# Patient Record
Sex: Male | Born: 1970 | Race: White | Hispanic: No | Smoking: Current every day smoker
Health system: Southern US, Community
[De-identification: ages and names within clinical notes are randomized; demographics above are authoritative.]

## PROBLEM LIST (undated history)

## (undated) DIAGNOSIS — G1111 Friedreich ataxia: Secondary | ICD-10-CM

## (undated) DIAGNOSIS — I219 Acute myocardial infarction, unspecified: Secondary | ICD-10-CM

## (undated) DIAGNOSIS — I10 Essential (primary) hypertension: Secondary | ICD-10-CM

## (undated) DIAGNOSIS — C801 Malignant (primary) neoplasm, unspecified: Secondary | ICD-10-CM

## (undated) DIAGNOSIS — G8929 Other chronic pain: Secondary | ICD-10-CM

## (undated) DIAGNOSIS — M549 Dorsalgia, unspecified: Secondary | ICD-10-CM

## (undated) HISTORY — PX: NECK SURGERY: SHX720

## (undated) HISTORY — PX: TRACHEOSTOMY: SUR1362

---

## 2001-03-09 ENCOUNTER — Emergency Department (HOSPITAL_COMMUNITY): Admission: EM | Admit: 2001-03-09 | Discharge: 2001-03-09 | Payer: Self-pay | Admitting: Emergency Medicine

## 2002-04-13 ENCOUNTER — Emergency Department (HOSPITAL_COMMUNITY): Admission: EM | Admit: 2002-04-13 | Discharge: 2002-04-14 | Payer: Self-pay | Admitting: Emergency Medicine

## 2004-09-04 ENCOUNTER — Emergency Department (HOSPITAL_COMMUNITY): Admission: EM | Admit: 2004-09-04 | Discharge: 2004-09-04 | Payer: Self-pay | Admitting: *Deleted

## 2007-10-01 ENCOUNTER — Emergency Department (HOSPITAL_COMMUNITY): Admission: EM | Admit: 2007-10-01 | Discharge: 2007-10-02 | Payer: Self-pay | Admitting: Emergency Medicine

## 2009-02-15 ENCOUNTER — Emergency Department (HOSPITAL_COMMUNITY): Admission: EM | Admit: 2009-02-15 | Discharge: 2009-02-15 | Payer: Self-pay | Admitting: Internal Medicine

## 2009-06-18 ENCOUNTER — Emergency Department (HOSPITAL_COMMUNITY): Admission: EM | Admit: 2009-06-18 | Discharge: 2009-06-18 | Payer: Self-pay | Admitting: Emergency Medicine

## 2010-04-07 LAB — CBC
Hemoglobin: 16.5 g/dL (ref 13.0–17.0)
MCHC: 34.6 g/dL (ref 30.0–36.0)
RDW: 11.9 % (ref 11.5–15.5)

## 2010-04-07 LAB — BASIC METABOLIC PANEL
CO2: 30 mEq/L (ref 19–32)
Calcium: 9.2 mg/dL (ref 8.4–10.5)
Glucose, Bld: 103 mg/dL — ABNORMAL HIGH (ref 70–99)
Sodium: 141 mEq/L (ref 135–145)

## 2010-04-07 LAB — DIFFERENTIAL
Basophils Absolute: 0 10*3/uL (ref 0.0–0.1)
Basophils Relative: 0 % (ref 0–1)
Eosinophils Absolute: 0 10*3/uL (ref 0.0–0.7)
Eosinophils Relative: 0 % (ref 0–5)
Monocytes Absolute: 0.5 10*3/uL (ref 0.1–1.0)
Neutro Abs: 2.3 10*3/uL (ref 1.7–7.7)

## 2010-04-07 LAB — POCT CARDIAC MARKERS
CKMB, poc: <1 ng/mL — ABNORMAL LOW (ref 1.0–8.0)
Myoglobin, poc: 31.1 ng/mL (ref 12–200)
Troponin i, poc: <0.05 ng/mL (ref 0.00–0.09)

## 2010-04-08 LAB — COMPREHENSIVE METABOLIC PANEL
ALT: 13 U/L (ref 0–53)
Albumin: 3.4 g/dL — ABNORMAL LOW (ref 3.5–5.2)
Alkaline Phosphatase: 81 U/L (ref 39–117)
Calcium: 9 mg/dL (ref 8.4–10.5)
Potassium: 4.4 mEq/L (ref 3.5–5.1)
Sodium: 135 mEq/L (ref 135–145)
Total Protein: 6.7 g/dL (ref 6.0–8.3)

## 2010-04-08 LAB — URINALYSIS, ROUTINE W REFLEX MICROSCOPIC
Nitrite: NEGATIVE
Protein, ur: NEGATIVE mg/dL
Urobilinogen, UA: 0.2 mg/dL (ref 0.0–1.0)

## 2010-04-08 LAB — DIFFERENTIAL
Basophils Relative: 0 % (ref 0–1)
Eosinophils Absolute: 0 10*3/uL (ref 0.0–0.7)
Lymphs Abs: 0.8 10*3/uL (ref 0.7–4.0)
Monocytes Absolute: 0.9 10*3/uL (ref 0.1–1.0)
Monocytes Relative: 10 % (ref 3–12)
Neutro Abs: 8 10*3/uL — ABNORMAL HIGH (ref 1.7–7.7)

## 2010-04-08 LAB — CBC
MCHC: 34.4 g/dL (ref 30.0–36.0)
Platelets: 227 10*3/uL (ref 150–400)
RDW: 11.7 % (ref 11.5–15.5)

## 2010-10-23 LAB — POCT CARDIAC MARKERS
CKMB, poc: <1 — ABNORMAL LOW
Myoglobin, poc: 25.5
Myoglobin, poc: 26.3

## 2010-10-23 LAB — DIFFERENTIAL
Basophils Relative: 1
Eosinophils Absolute: 0
Eosinophils Relative: 1
Lymphs Abs: 1.5
Monocytes Absolute: 0.5
Monocytes Relative: 12
Neutrophils Relative %: 54

## 2010-10-23 LAB — CBC
HCT: 45.1
MCHC: 34.3
MCV: 102.9 — ABNORMAL HIGH
RBC: 4.39

## 2010-10-23 LAB — POCT I-STAT, CHEM 8
Calcium, Ion: 1.08 — ABNORMAL LOW
Glucose, Bld: 89
HCT: 49
Hemoglobin: 16.7
TCO2: 27

## 2010-10-23 LAB — RAPID URINE DRUG SCREEN, HOSP PERFORMED
Amphetamines: NOT DETECTED
Benzodiazepines: NOT DETECTED
Cocaine: NOT DETECTED

## 2010-10-23 LAB — URINALYSIS, ROUTINE W REFLEX MICROSCOPIC
Glucose, UA: NEGATIVE
Hgb urine dipstick: NEGATIVE
Ketones, ur: NEGATIVE
Protein, ur: NEGATIVE
pH: 7

## 2010-10-23 LAB — HEPATIC FUNCTION PANEL
ALT: 39
Alkaline Phosphatase: 80
Bilirubin, Direct: 0.4 — ABNORMAL HIGH
Indirect Bilirubin: 1.4 — ABNORMAL HIGH
Total Bilirubin: 1.8 — ABNORMAL HIGH
Total Protein: 6.5

## 2010-10-23 LAB — LIPASE, BLOOD: Lipase: 63 — ABNORMAL HIGH

## 2010-11-25 ENCOUNTER — Emergency Department (HOSPITAL_COMMUNITY)
Admission: EM | Admit: 2010-11-25 | Discharge: 2010-11-25 | Disposition: A | Discharge status: Home / Self Care | Payer: Self-pay

## 2010-11-25 NOTE — ED Notes (Signed)
Pt called for triage x4.  No response. 

## 2010-11-25 NOTE — ED Notes (Signed)
Pt called once. No answer 

## 2010-11-25 NOTE — ED Notes (Signed)
Pt called twice. No answer 

## 2011-05-30 ENCOUNTER — Emergency Department (HOSPITAL_COMMUNITY)
Admission: EM | Admit: 2011-05-30 | Discharge: 2011-05-30 | Disposition: A | Discharge status: Home / Self Care | Payer: Self-pay | Attending: Emergency Medicine | Admitting: Emergency Medicine

## 2011-05-30 ENCOUNTER — Encounter (HOSPITAL_COMMUNITY): Payer: Self-pay | Admitting: *Deleted

## 2011-05-30 ENCOUNTER — Emergency Department (HOSPITAL_COMMUNITY): Payer: Self-pay

## 2011-05-30 DIAGNOSIS — I252 Old myocardial infarction: Secondary | ICD-10-CM | POA: Insufficient documentation

## 2011-05-30 DIAGNOSIS — I10 Essential (primary) hypertension: Secondary | ICD-10-CM | POA: Insufficient documentation

## 2011-05-30 DIAGNOSIS — R197 Diarrhea, unspecified: Secondary | ICD-10-CM | POA: Insufficient documentation

## 2011-05-30 DIAGNOSIS — F17200 Nicotine dependence, unspecified, uncomplicated: Secondary | ICD-10-CM | POA: Insufficient documentation

## 2011-05-30 DIAGNOSIS — R079 Chest pain, unspecified: Secondary | ICD-10-CM | POA: Insufficient documentation

## 2011-05-30 DIAGNOSIS — R112 Nausea with vomiting, unspecified: Secondary | ICD-10-CM | POA: Insufficient documentation

## 2011-05-30 DIAGNOSIS — J9383 Other pneumothorax: Secondary | ICD-10-CM | POA: Insufficient documentation

## 2011-05-30 HISTORY — DX: Acute myocardial infarction, unspecified: I21.9

## 2011-05-30 HISTORY — DX: Essential (primary) hypertension: I10

## 2011-05-30 LAB — DIFFERENTIAL
Basophils Absolute: 0 10*3/uL (ref 0.0–0.1)
Lymphocytes Relative: 33 % (ref 12–46)
Lymphs Abs: 1.7 10*3/uL (ref 0.7–4.0)
Neutro Abs: 2.8 10*3/uL (ref 1.7–7.7)
Neutrophils Relative %: 54 % (ref 43–77)

## 2011-05-30 LAB — RAPID URINE DRUG SCREEN, HOSP PERFORMED
Amphetamines: NOT DETECTED
Barbiturates: NOT DETECTED
Cocaine: NOT DETECTED
Opiates: NOT DETECTED
Tetrahydrocannabinol: POSITIVE — AB

## 2011-05-30 LAB — CBC
Platelets: 159 10*3/uL (ref 150–400)
RBC: 3.78 MIL/uL — ABNORMAL LOW (ref 4.22–5.81)
RDW: 12.4 % (ref 11.5–15.5)
WBC: 5.2 10*3/uL (ref 4.0–10.5)

## 2011-05-30 LAB — URINALYSIS, ROUTINE W REFLEX MICROSCOPIC
Glucose, UA: NEGATIVE mg/dL
Leukocytes, UA: NEGATIVE
Nitrite: NEGATIVE
pH: 6 (ref 5.0–8.0)

## 2011-05-30 LAB — TROPONIN I: Troponin I: <0.3 ng/mL (ref <0.30)

## 2011-05-30 LAB — COMPREHENSIVE METABOLIC PANEL
AST: 318 U/L — ABNORMAL HIGH (ref 0–37)
Albumin: 3.3 g/dL — ABNORMAL LOW (ref 3.5–5.2)
BUN: 9 mg/dL (ref 6–23)
Chloride: 99 mEq/L (ref 96–112)
Creatinine, Ser: 0.65 mg/dL (ref 0.50–1.35)
Potassium: 3.8 mEq/L (ref 3.5–5.1)
Total Bilirubin: 0.9 mg/dL (ref 0.3–1.2)
Total Protein: 6.3 g/dL (ref 6.0–8.3)

## 2011-05-30 MED ORDER — SODIUM CHLORIDE 0.9 % IV SOLN: 1000.0000 mL | INTRAVENOUS | Status: DC

## 2011-05-30 MED ORDER — SODIUM CHLORIDE 0.9 % IV SOLN
1000.0000 mL | Freq: Once | INTRAVENOUS | Status: AC
  Administered 2011-05-30: 1000 mL via INTRAVENOUS

## 2011-05-30 MED ORDER — SODIUM CHLORIDE 0.9 % IV SOLN: 1000.0000 mL | Freq: Once | INTRAVENOUS | Status: DC

## 2011-05-30 NOTE — ED Notes (Signed)
Chest pain since yesterday 1030 am.  Nausea, diarrhea.,

## 2011-05-30 NOTE — ED Notes (Signed)
Pt left prior to d/c papers given. Pt room found with no IV in trash. Sheriffs office contacted for a well check on pt to confirm IV removal. Per dispatch officer will call back once contact is made.

## 2011-05-30 NOTE — Discharge Instructions (Signed)
Diarrhea Diarrhea is watery poop (stool). The most common cause of diarrhea is a germ. Other causes include:  Food poisoning.   A reaction to medicine.  HOME CARE   Drink clear fluids. This can stop you from losing too much body fluid (dehydration).   Drink enough fluids to keep your pee (urine) clear or pale yellow.   Avoid solid foods and dairy products until you start to feel better. Then start eating bland foods, such as:   Bananas.   Rice.   Crackers.   Applesauce.   Dry toast.   Avoid spicy foods, caffeine, and alcohol.   Your doctor may give medicine to help with cramps and watery poop. Take this as told. Avoid these medicines if you have a fever or blood in your poop.   Take your medicine as told. Finish them even if you start to feel better.  GET HELP RIGHT AWAY IF:   The watery poop lasts longer than 3 days.   You have a fever.   Your baby is older than 3 months with a rectal temperature of 100.5 F (38.1 C) or higher for more than 1 day.   There is blood in your poop.   You start to throw up (vomit).   You lose too much fluid.  MAKE SURE YOU:   Understand these instructions.   Will watch your condition.   Will get help right away if you are not doing well or get worse.  Document Released: 06/25/2007 Document Revised: 12/26/2010 Document Reviewed: 06/25/2007 [facility] Patient Information 2012 [place], [place].Pneumothorax A pneumothorax is a collapsed lung. This is a condition that usually occurs suddenly. It happens when air begins leaking from a lung and begins to build up between the lung and the pleura (the thin lining around the lung on the inside of the rib cage). When this is small, there may be minimal pain, difficulty breathing, or other problems. Sometimes it can be followed without being admitted to the hospital. When it is larger, it may require hospitalization. A tube (called a chest tube) may need to be inserted into the air space. It will  remove the air between the lung and the rib cage. This immediately expands the lung back to normal size and makes breathing easier. A chest tube is usually in for 1 to 7 days, although it may be longer. CAUSES   It may happen spontaneously with no apparent reason.   It may happen because of previous lung disease or problems. Examples of this include:   Asthma.   Emphysema.   Injury   Rib fractures.   Blunt (from a car accident or a fall).   Sharp (from a knife or gunshot wound).  SYMPTOMS  This condition may be associated with:  Cough.   Chest pain.   Shortness of breath.   Increased rate of breathing.  HOME CARE INSTRUCTIONS   Only take over-the-counter or prescription medicines for pain, discomfort, or fever as directed by your caregiver.   Smoking is a common aggravating factor. Stop smoking.   If a cough or pain makes it difficult to sleep at night, try sleeping in a semi-upright position in a recliner or using 2 or 3 pillows may help.   Rest as you feel it is needed. Your body will help guide you in this.   If you had a chest tube and it was removed, ask your provider when it is okay to remove the dressing.   Do not fly in  an airplane or scuba dive after being treated for a pneumothorax until your provider says it is okay.  If you have been sent home with a diagnosis of pneumothorax, it is small and your caregiver believes it will get better without further treatment. However, your condition can change over time. You must monitor your condition carefully. If you have a significant change in how you feel, carefully follow the instructions below. SEEK IMMEDIATE MEDICAL CARE IF:   You have increasing shortness of breath.   You cannot control your cough with suppressants and are losing sleep.   You begin coughing up blood.   You develop pain which is getting worse or is uncontrolled with medicines.   You have a fever.   You develop pus-like sputum.   Your  symptoms which brought you initially in for care are getting worse rather than better or are not controlled with medicines.  MAKE SURE YOU:   Understand these instructions.   Will watch your condition.   Will get help right away if you are not doing well or get worse.  Document Released: 01/06/2005 Document Revised: 12/26/2010 Document Reviewed: 03/02/2008  [facility] Patient Information 2012 [place], [place].

## 2011-05-30 NOTE — ED Notes (Signed)
Patient has clothing on and refused vital signs.  Requested that saline lock be removed and wants to be discharged.  Advised patient I could not take saline lock out unless nurse instructed me to.  Advised nurse of patient's request.

## 2011-05-30 NOTE — ED Provider Notes (Signed)
History     CSN: [id number]  Arrival date & time 05/30/11  1251   First MD Initiated Contact with Patient 05/30/11 1319      Chief Complaint  Patient presents with  . Chest Pain    (Consider location/radiation/quality/duration/timing/severity/associated sxs/prior treatment) HPI Comments: Jerome Washington is a 41 y.o. Male who is here to 2 diarrhea for 2 days. Complains of loose, brown diarrhea 5-6 times per day. He has had nausea with occasional vomiting. No blood in stool or emesis. He denies fever, cough, shortness of breath. He has intermittent left upper chest pain on and off for 2 days; which started after he moved several heavy trays of beans. Each episode lasts about 10 minutes and is worse with dep breathing. He denies taking medicine for the problem. He has not had this  problem previously. His mother states that he is chronically underweight. Patient states he has immediate urge to defecate after eating many years.   Patient is a 41 y.o. male presenting with chest pain. The history is provided by a parent and the patient.  Chest Pain     Past Medical History  Diagnosis Date  . Myocardial infarction   . Hypertension     History reviewed. No pertinent past surgical history.  History reviewed. No pertinent family history.  History  Substance Use Topics  . Smoking status: Current Everyday Smoker  . Smokeless tobacco: Not on file  . Alcohol Use: Yes      Review of Systems  Cardiovascular: Positive for chest pain.  All other systems reviewed and are negative.    Allergies  Review of patient's allergies indicates no known allergies.  Home Medications   Current Outpatient Rx  Name Route Sig Dispense Refill  . VITAMIN C PO Oral Take 1 tablet by mouth daily.    . ASPIRIN EC 81 MG PO TBEC Oral Take 162 mg by mouth daily as needed. For pain      BP 136/89  Pulse 99  Temp(Src) 98.4 F (36.9 C) (Oral)  Resp 20  Ht 6' (1.829 m)  Wt 140 lb (63.504 kg)  BMI  18.99 kg/m2  SpO2 99%  Physical Exam  Nursing note and vitals reviewed. Constitutional: He is oriented to person, place, and time. He appears well-developed.       He is underweight.  HENT:  Head: Normocephalic and atraumatic.  Right Ear: External ear normal.  Left Ear: External ear normal.  Eyes: Conjunctivae and EOM are normal. Pupils are equal, round, and reactive to light.  Neck: Normal range of motion and phonation normal. Neck supple.  Cardiovascular: Normal rate, regular rhythm, normal heart sounds and intact distal pulses.   Pulmonary/Chest: Effort normal and breath sounds normal. He exhibits no bony tenderness.  Abdominal: Soft. Normal appearance. He exhibits no mass. There is no tenderness. There is no guarding.  Musculoskeletal: Normal range of motion.  Neurological: He is alert and oriented to person, place, and time. He has normal strength. No cranial nerve deficit or sensory deficit. He exhibits normal muscle tone. Coordination normal.  Skin: Skin is warm, dry and intact.  Psychiatric: He has a normal mood and affect. His behavior is normal. Judgment and thought content normal.    ED Course  Procedures (including critical care time) Repeat vitals at 1315 normal. Pulse oxygenation 99% on room air is normal   Date: 05/30/2011  Rate: 109  Rhythm: sinus tachycardia  QRS Axis: normal  Intervals: normal  ST/T Wave abnormalities: normal  Conduction Disutrbances:none  Narrative Interpretation: poor R wave progression  Old EKG Reviewed: unchanged    Labs Reviewed  CBC - Abnormal; Notable for the following:    RBC 3.78 (*)    HCT 38.8 (*)    MCV 102.6 (*)    MCH 35.7 (*)    All other components within normal limits  URINALYSIS, ROUTINE W REFLEX MICROSCOPIC - Abnormal; Notable for the following:    Specific Gravity, Urine >1.030 (*)    All other components within normal limits  URINE RAPID DRUG SCREEN (HOSP PERFORMED) - Abnormal; Notable for the following:     Tetrahydrocannabinol POSITIVE (*)    All other components within normal limits  COMPREHENSIVE METABOLIC PANEL - Abnormal; Notable for the following:    Albumin 3.3 (*)    AST 318 (*)    ALT 89 (*)    Alkaline Phosphatase 205 (*)    All other components within normal limits  DIFFERENTIAL  TROPONIN I  URINE CULTURE   Dg Chest 2 View  05/30/2011  *RADIOLOGY REPORT*  Clinical Data: Chest pain  CHEST - 2 VIEW  Comparison: 02/15/2009  Findings: 10% left apical pneumothorax.  Hyperaeration.  Normal heart size.  No mass or consolidation.  IMPRESSION: 10% left apical pneumothorax.  Original Report Authenticated By: Donavan Burnet, M.D.     1. Pneumothorax   2. Diarrhea       MDM   Chest discomfort, likely due to pneumothorax. He is now about  >24 hours out from the probable onset of the pneumothorax. He has no respiratory distress. It is unlikely that he will progress. No apparent cause for pneumothorax. His diarrhea is ongoing and chronic. He is moderately malnourished. Doubt significant metabolic disorder, occult infection or impending vascular collapse.   Plan: Home Medications- Tylenol for pain; Home Treatments- rest; Recommended follow up- PCP in 3 days for repeat CXR, return here for worsening sx      Flint Melter, MD 05/30/11 [phone]

## 2011-06-01 LAB — URINE CULTURE
Colony Count: NO GROWTH
Culture  Setup Time: 201305110348

## 2011-06-10 ENCOUNTER — Emergency Department (HOSPITAL_COMMUNITY): Payer: Self-pay

## 2011-06-10 ENCOUNTER — Encounter (HOSPITAL_COMMUNITY): Payer: Self-pay

## 2011-06-10 ENCOUNTER — Emergency Department (HOSPITAL_COMMUNITY)
Admission: EM | Admit: 2011-06-10 | Discharge: 2011-06-10 | Disposition: A | Discharge status: Home / Self Care | Payer: Self-pay | Attending: Emergency Medicine | Admitting: Emergency Medicine

## 2011-06-10 DIAGNOSIS — I252 Old myocardial infarction: Secondary | ICD-10-CM | POA: Insufficient documentation

## 2011-06-10 DIAGNOSIS — I10 Essential (primary) hypertension: Secondary | ICD-10-CM | POA: Insufficient documentation

## 2011-06-10 DIAGNOSIS — F1010 Alcohol abuse, uncomplicated: Secondary | ICD-10-CM | POA: Insufficient documentation

## 2011-06-10 DIAGNOSIS — R079 Chest pain, unspecified: Secondary | ICD-10-CM | POA: Insufficient documentation

## 2011-06-10 MED ORDER — OXYCODONE-ACETAMINOPHEN 5-325 MG PO TABS
1.0000 | ORAL_TABLET | Freq: Once | ORAL | Status: AC
  Administered 2011-06-10: 1 via ORAL
  Filled 2011-06-10: qty 1

## 2011-06-10 MED ORDER — OXYCODONE-ACETAMINOPHEN 5-325 MG PO TABS: 1.0000 | ORAL_TABLET | ORAL | Status: AC | PRN

## 2011-06-10 NOTE — ED Notes (Signed)
Pt reports was here a few days ago and was told small part of left lung was collapsed.  PT says went home and has been taking ibuprofen.  Reports has intermittent pressure in left side of chest and sob.  Denies pressure or SOB at this time.   BReath sounds auscultated.

## 2011-06-10 NOTE — ED Notes (Signed)
Complain of pain in left rib area.

## 2011-06-10 NOTE — ED Provider Notes (Signed)
History   This chart was scribed for Jerome Gaskins, MD by Clarita Crane. The patient was seen in room APA04/APA04. Patient's care was started at 1152.    CSN: [id number]  Arrival date & time 06/10/11  1152   First MD Initiated Contact with Patient 06/10/11 1203      Chief Complaint  Patient presents with  . Shortness of Breath    HPI Jerome Washington is a 41 y.o. male who presents to the Emergency Department complaining of intermittent pain to left rib region onset several days ago and persistent since with associated weakness and intermittent nausea and diarrhea. Patient states pain is aggravated with deep breathing and movement and is temporarily relieved with rest. Denies fever, syncope, abdominal pain, vomiting.  Patient notes he was evaluated in ED 11 days ago and was dx with pneumothroax at that time. Patient with h/o MI, hypertension and is a current smoker.  He does not recall a fall to injure his chest   Past Medical History  Diagnosis Date  . Myocardial infarction   . Hypertension     History reviewed. No pertinent past surgical history.  No family history on file.  History  Substance Use Topics  . Smoking status: Current Everyday Smoker  . Smokeless tobacco: Not on file  . Alcohol Use: Yes      Review of Systems A complete 10 system review of systems was obtained and all systems are negative except as noted in the HPI and PMH.   Allergies  Review of patient's allergies indicates no known allergies.  Home Medications   Current Outpatient Rx  Name Route Sig Dispense Refill  . VITAMIN C PO Oral Take 1 tablet by mouth daily.    . ASPIRIN EC 81 MG PO TBEC Oral Take 162 mg by mouth daily as needed. For pain      BP 126/94  Pulse 110  Temp(Src) 98.4 F (36.9 C) (Oral)  Resp 22  Ht 5\' 11"  (1.803 m)  Wt 116 lb 3 oz (52.702 kg)  BMI 16.20 kg/m2  SpO2 96%  Physical Exam CONSTITUTIONAL: cachectic but no distress noted HEAD AND FACE:  Normocephalic/atraumatic EYES: EOMI/PERRL ENMT: Mucous membranes moist NECK: supple no meningeal signs SPINE:entire spine nontender CV: S1/S2 noted, no murmurs/rubs/gallops noted LUNGS: Lungs are clear to auscultation bilaterally, no apparent distress CHEST: no crepitance, tender to palpation ABDOMEN: soft, nontender, no rebound or guarding GU:no cva tenderness NEURO: Pt is awake/alert, moves all extremitiesx4, no edema EXTREMITIES: pulses normal, full ROM SKIN: warm, color normal PSYCH: no abnormalities of mood noted   ED Course  Procedures DIAGNOSTIC STUDIES: Oxygen Saturation is 96% on room air, adequate by my interpretation.    COORDINATION OF CARE: 12:19PM-Patient informed of current plan for treatment and evaluation and agrees with plan at this time.   Pt in no distress.  He report CP/SOB with exertion, most of his pain is while lying flat or pressing his chest Suspicion for ACS/Pe/dissection is low.  His repeat CXR shows resolved PTX.    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  Family at bedside and they are attempting to find him help as outpatient.  He is awake/alert, stable for d/c  The patient appears reasonably screened and/or stabilized for discharge and I doubt any other medical condition or other [facility] requiring further screening, evaluation, or treatment in the ED at this time prior to discharge.     MDM  Nursing notes reviewed and  considered in documentation xrays reviewed and considered Previous records reviewed and considered      Date: 06/10/2011  Rate: 83  Rhythm: normal sinus rhythm  QRS Axis: normal  Intervals: normal  ST/T Wave abnormalities: nonspecific ST changes  Conduction Disutrbances:none  Narrative Interpretation:   Old EKG Reviewed: changes noted But no signs of active ischemia   I personally performed the services described in this documentation, which was scribed in my presence. The recorded information  has been reviewed and considered.      Jerome Gaskins, MD 06/10/11 [phone]

## 2011-06-10 NOTE — Discharge Instructions (Signed)
Your caregiver has diagnosed you as having chest pain that is not specific for one problem, but does not require admission.  Chest pain comes from many different causes.  SEEK IMMEDIATE MEDICAL ATTENTION IF: You have severe chest pain, especially if the pain is crushing or pressure-like and spreads to the arms, back, neck, or jaw, or if you have sweating, nausea (feeling sick to your stomach), or shortness of breath. THIS IS AN EMERGENCY. Don't wait to see if the pain will go away. Get medical help at once. Call 911 or 0 (operator). DO NOT drive yourself to the hospital.  Your chest pain gets worse and does not go away with rest.  You have an attack of chest pain lasting longer than usual, despite rest and treatment with the medications your caregiver has prescribed.  You wake from sleep with chest pain or shortness of breath.  You feel dizzy or faint.  You have chest pain not typical of your usual pain for which you originally saw your caregiver.   Alcohol Problems Most adults who drink alcohol drink in moderation (not a lot) are at low risk for developing problems related to their drinking. However, all drinkers, including low-risk drinkers, should know about the health risks connected with drinking alcohol. RECOMMENDATIONS FOR LOW-RISK DRINKING  Drink in moderation. Moderate drinking is defined as follows:   Men - no more than 2 drinks per day.   Nonpregnant women - no more than 1 drink per day.   Over age 21 - no more than 1 drink per day.  A standard drink is 12 grams of pure alcohol, which is equal to a 12 ounce bottle of beer or wine cooler, a 5 ounce glass of wine, or 1.5 ounces of distilled spirits (such as whiskey, brandy, vodka, or rum).  ABSTAIN FROM (DO NOT DRINK) ALCOHOL:  When pregnant or considering pregnancy.   When taking a medication that interacts with alcohol.   If you are alcohol dependent.   A medical condition that prohibits drinking alcohol (such as ulcer, liver  disease, or heart disease).  DISCUSS WITH YOUR CAREGIVER:  If you are at risk for coronary heart disease, discuss the potential benefits and risks of alcohol use: Light to moderate drinking is associated with lower rates of coronary heart disease in certain populations (for example, men over age 62 and postmenopausal women). Infrequent or nondrinkers are advised not to begin light to moderate drinking to reduce the risk of coronary heart disease so as to avoid creating an alcohol-related problem. Similar protective effects can likely be gained through proper diet and exercise.   Women and the elderly have smaller amounts of body water than men. As a result women and the elderly achieve a higher blood alcohol concentration after drinking the same amount of alcohol.   Exposing a fetus to alcohol can cause a broad range of birth defects referred to as Fetal Alcohol Syndrome (FAS) or Alcohol-Related Birth Defects (ARBD). Although FAS/ARBD is connected with excessive alcohol consumption during pregnancy, studies also have reported neurobehavioral problems in infants born to mothers reporting drinking an average of 1 drink per day during pregnancy.   Heavier drinking (the consumption of more than 4 drinks per occasion by men and more than 3 drinks per occasion by women) impairs learning (cognitive) and psychomotor functions and increases the risk of alcohol-related problems, including accidents and injuries.  CAGE QUESTIONS:   Have you ever felt that you should Cut down on your drinking?   Have  people Annoyed you by criticizing your drinking?   Have you ever felt bad or Guilty about your drinking?   Have you ever had a drink first thing in the morning to steady your nerves or get rid of a hangover (Eye opener)?  If you answered positively to any of these questions: You may be at risk for alcohol-related problems if alcohol consumption is:   Men: Greater than 14 drinks per week or more than 4 drinks  per occasion.   Women: Greater than 7 drinks per week or more than 3 drinks per occasion.  Do you or your family have a medical history of alcohol-related problems, such as:  Blackouts.   Sexual dysfunction.   Depression.   Trauma.   Liver dysfunction.   Sleep disorders.   Hypertension.   Chronic abdominal pain.   Has your drinking ever caused you problems, such as problems with your family, problems with your work (or school) performance, or accidents/injuries?   Do you have a compulsion to drink or a preoccupation with drinking?   Do you have poor control or are you unable to stop drinking once you have started?   Do you have to drink to avoid withdrawal symptoms?   Do you have problems with withdrawal such as tremors, nausea, sweats, or mood disturbances?   Does it take more alcohol than in the past to get you high?   Do you feel a strong urge to drink?   Do you change your plans so that you can have a drink?   Do you ever drink in the morning to relieve the shakes or a hangover?  If you have answered a number of the previous questions positively, it may be time for you to talk to your caregivers, family, and friends and see if they think you have a problem. Alcoholism is a chemical dependency that keeps getting worse and will eventually destroy your health and relationships. Many alcoholics end up dead, impoverished, or in prison. This is often the end result of all chemical dependency.  Do not be discouraged if you are not ready to take action immediately.   Decisions to change behavior often involve up and down desires to change and feeling like you cannot decide.   Try to think more seriously about your drinking behavior.   Think of the reasons to quit.  WHERE TO GO FOR ADDITIONAL INFORMATION   The National Institute on Alcohol Abuse and Alcoholism (NIAAA)www.niaaa.nih.gov   [employer] on Alcoholism and Drug Dependence (NCADD)www.ncadd.org   American  Society of Addiction Medicine (ASAM)www.https://anderson-johnson.com/  Document Released: 01/06/2005 Document Revised: 12/26/2010 Document Reviewed: 08/25/2007 [facility] Patient Information 2012 [place], [place].

## 2011-10-04 ENCOUNTER — Emergency Department (HOSPITAL_COMMUNITY): Payer: Self-pay

## 2011-10-04 ENCOUNTER — Emergency Department (HOSPITAL_COMMUNITY)
Admission: EM | Admit: 2011-10-04 | Discharge: 2011-10-04 | Disposition: A | Discharge status: Home / Self Care | Payer: Self-pay | Attending: Emergency Medicine | Admitting: Emergency Medicine

## 2011-10-04 ENCOUNTER — Encounter (HOSPITAL_COMMUNITY): Payer: Self-pay | Admitting: Emergency Medicine

## 2011-10-04 DIAGNOSIS — F17200 Nicotine dependence, unspecified, uncomplicated: Secondary | ICD-10-CM | POA: Insufficient documentation

## 2011-10-04 DIAGNOSIS — I10 Essential (primary) hypertension: Secondary | ICD-10-CM | POA: Insufficient documentation

## 2011-10-04 DIAGNOSIS — I252 Old myocardial infarction: Secondary | ICD-10-CM | POA: Insufficient documentation

## 2011-10-04 DIAGNOSIS — G8929 Other chronic pain: Secondary | ICD-10-CM | POA: Insufficient documentation

## 2011-10-04 DIAGNOSIS — M549 Dorsalgia, unspecified: Secondary | ICD-10-CM | POA: Insufficient documentation

## 2011-10-04 HISTORY — DX: Dorsalgia, unspecified: M54.9

## 2011-10-04 HISTORY — DX: Other chronic pain: G89.29

## 2011-10-04 MED ORDER — OXYCODONE-ACETAMINOPHEN 5-325 MG PO TABS
1.0000 | ORAL_TABLET | Freq: Once | ORAL | Status: AC
  Administered 2011-10-04: 1 via ORAL
  Filled 2011-10-04: qty 1

## 2011-10-04 MED ORDER — OXYCODONE-ACETAMINOPHEN 5-325 MG PO TABS: 1.0000 | ORAL_TABLET | Freq: Three times a day (TID) | ORAL | Status: DC | PRN

## 2011-10-04 NOTE — ED Notes (Signed)
Patient with no complaints at this time. Respirations even and unlabored. Skin warm/dry. Discharge instructions reviewed with patient at this time. Patient given opportunity to voice concerns/ask questions. Patient discharged at this time and left Emergency Department with steady gait.   

## 2011-10-04 NOTE — ED Provider Notes (Signed)
History     CSN: [id number]  Arrival date & time 10/04/11  1347   First MD Initiated Contact with Patient 10/04/11 1423      Chief Complaint  Patient presents with  . Back Pain     Patient is a 41 y.o. male presenting with back pain. The history is provided by the patient.  Back Pain  This is a new problem. The current episode started more than 1 week ago. The problem occurs constantly. The problem has been gradually worsening. The pain is associated with falling. The pain is present in the lumbar spine. The quality of the pain is described as shooting. The pain radiates to the right thigh and left thigh. The pain is moderate. The symptoms are aggravated by certain positions. The pain is the same all the time. Associated symptoms include tingling. Pertinent negatives include no chest pain, no fever, no abdominal pain, no bowel incontinence, no bladder incontinence, no dysuria, no paresis and no weakness. He has tried bed rest for the symptoms. The treatment provided mild relief.  pt reports he was helping move a lawnmower several weeks ago and fell in the process (reports landed on his low back but not crushed by lawnmower) but no head injury and no neck injury He reports since then he has had back pain and intermittent episodes of numbness in his legs - but he denies weakness and he denies urinary/fecal incontinence.  Past Medical History  Diagnosis Date  . Myocardial infarction   . Hypertension   . Chronic back pain     History reviewed. No pertinent past surgical history.  History reviewed. No pertinent family history.  History  Substance Use Topics  . Smoking status: Current Every Day Smoker  . Smokeless tobacco: Not on file  . Alcohol Use: Yes     3x a week a 6 pack      Review of Systems  Constitutional: Negative for fever.  Cardiovascular: Negative for chest pain.  Gastrointestinal: Negative for abdominal pain and bowel incontinence.  Genitourinary: Negative for  bladder incontinence, dysuria, flank pain and difficulty urinating.  Musculoskeletal: Positive for back pain.  Neurological: Positive for tingling. Negative for weakness.  All other systems reviewed and are negative.    Allergies  Review of patient's allergies indicates no known allergies.  Home Medications   Current Outpatient Rx  Name Route Sig Dispense Refill  . VITAMIN C PO Oral Take 1 tablet by mouth daily.    . ASPIRIN EC 81 MG PO TBEC Oral Take 162 mg by mouth daily as needed. For pain      BP 127/90  Pulse 111  Temp 98.1 F (36.7 C) (Oral)  Resp 20  Ht 6' (1.829 m)  Wt 115 lb (52.164 kg)  BMI 15.60 kg/m2  SpO2 99%  Physical Exam CONSTITUTIONAL: no distress but he is thin HEAD AND FACE: Normocephalic/atraumatic EYES: EOMI/PERRL ENMT: Mucous membranes moist NECK: supple no meningeal signs SPINE:lumbar spine tender.  No thoracic tenderness.  No cervical tenderness.  No bruising/crepitance/stepoffs noted to spine CV: S1/S2 noted, no murmurs/rubs/gallops noted LUNGS: Lungs are clear to auscultation bilaterally, no apparent distress ABDOMEN: soft, nontender, no rebound or guarding GU:no cva tenderness NEURO: Awake/alert,equal distal motor: hip flexion/knee flexion/extension, ankle dorsi/plantar flexion, great toe extension intact bilaterally, no clonus bilaterally, plantar reflex appropriate, no apparent sensory deficit in any dermatome.  Equal patellar/achilles reflex noted.  Pt is able to ambulate. EXTREMITIES: pulses normal, full ROM SKIN: warm, color normal PSYCH: no abnormalities  of mood noted  ED Course  Procedures  2:38 PM Will obtain lumbar xray and reassess  3:11 PM Pt feels improved No traumatic injury noted to his back No focal neuro deficits Discussed f/u as outpatient Discussed strict return precautions with patient  MDM  Nursing notes including past medical history and social history reviewed and considered in documentation xrays reviewed and  considered         Joya Gaskins, MD 10/04/11 1511

## 2011-10-04 NOTE — ED Notes (Signed)
Pt c/o lower back pain radiating down legs, burning and making both legs numb. Pt lifts a lot.

## 2011-10-06 ENCOUNTER — Emergency Department (HOSPITAL_COMMUNITY)
Admission: EM | Admit: 2011-10-06 | Discharge: 2011-10-06 | Disposition: A | Discharge status: Home / Self Care | Payer: Self-pay | Attending: Emergency Medicine | Admitting: Emergency Medicine

## 2011-10-06 ENCOUNTER — Encounter (HOSPITAL_COMMUNITY): Payer: Self-pay

## 2011-10-06 DIAGNOSIS — Q762 Congenital spondylolisthesis: Secondary | ICD-10-CM | POA: Insufficient documentation

## 2011-10-06 DIAGNOSIS — G8929 Other chronic pain: Secondary | ICD-10-CM | POA: Insufficient documentation

## 2011-10-06 DIAGNOSIS — M545 Low back pain: Secondary | ICD-10-CM

## 2011-10-06 DIAGNOSIS — I252 Old myocardial infarction: Secondary | ICD-10-CM | POA: Insufficient documentation

## 2011-10-06 DIAGNOSIS — F17200 Nicotine dependence, unspecified, uncomplicated: Secondary | ICD-10-CM | POA: Insufficient documentation

## 2011-10-06 DIAGNOSIS — I10 Essential (primary) hypertension: Secondary | ICD-10-CM | POA: Insufficient documentation

## 2011-10-06 MED ORDER — METHOCARBAMOL 500 MG PO TABS
1000.0000 mg | ORAL_TABLET | Freq: Once | ORAL | Status: AC
  Administered 2011-10-06: 1000 mg via ORAL
  Filled 2011-10-06: qty 2

## 2011-10-06 MED ORDER — OXYCODONE-ACETAMINOPHEN 5-325 MG PO TABS
1.0000 | ORAL_TABLET | Freq: Once | ORAL | Status: AC
  Administered 2011-10-06: 1 via ORAL
  Filled 2011-10-06: qty 1

## 2011-10-06 MED ORDER — DEXAMETHASONE SODIUM PHOSPHATE 4 MG/ML IJ SOLN
8.0000 mg | Freq: Once | INTRAMUSCULAR | Status: AC
  Administered 2011-10-06: 8 mg via INTRAMUSCULAR
  Filled 2011-10-06: qty 2

## 2011-10-06 MED ORDER — DEXAMETHASONE 4 MG PO TABS: ORAL_TABLET | ORAL | Status: DC

## 2011-10-06 MED ORDER — PROMETHAZINE-CODEINE 6.25-10 MG/5ML PO SYRP: ORAL_SOLUTION | ORAL | Status: DC

## 2011-10-06 MED ORDER — ONDANSETRON HCL 4 MG PO TABS
4.0000 mg | ORAL_TABLET | Freq: Once | ORAL | Status: AC
  Administered 2011-10-06: 4 mg via ORAL
  Filled 2011-10-06: qty 1

## 2011-10-06 MED ORDER — BACLOFEN 10 MG PO TABS: 10.0000 mg | ORAL_TABLET | Freq: Three times a day (TID) | ORAL | Status: DC

## 2011-10-06 NOTE — ED Notes (Signed)
Pt reports that he was lifting heavy object 2 weeks ago and injured back, cont. To have sever mid/low back pain.

## 2011-10-06 NOTE — ED Provider Notes (Signed)
History     CSN: [id number]  Arrival date & time 10/06/11  4098   First MD Initiated Contact with Patient 10/06/11 [phone]      Chief Complaint  Patient presents with  . Back Pain    (Consider location/radiation/quality/duration/timing/severity/associated sxs/prior treatment) Patient is a 41 y.o. male presenting with back pain. The history is provided by the patient.  Back Pain  This is a new problem. The problem occurs daily. The problem has not changed since onset.The pain is associated with lifting heavy objects. The pain is present in the lumbar spine. The quality of the pain is described as aching. The pain is moderate. The symptoms are aggravated by certain positions. The pain is the same all the time. Associated symptoms include chest pain. Pertinent negatives include no fever, no abdominal pain, no bowel incontinence, no perianal numbness, no bladder incontinence and no dysuria. He has tried analgesics for the symptoms.    Past Medical History  Diagnosis Date  . Myocardial infarction   . Hypertension   . Chronic back pain     History reviewed. No pertinent past surgical history.  No family history on file.  History  Substance Use Topics  . Smoking status: Current Every Day Smoker    Types: Cigarettes  . Smokeless tobacco: Not on file  . Alcohol Use: Yes     3x a week a 6 pack      Review of Systems  Constitutional: Negative for fever and activity change.       All ROS Neg except as noted in HPI  HENT: Negative for nosebleeds and neck pain.   Eyes: Negative for photophobia and discharge.  Respiratory: Negative for cough, shortness of breath and wheezing.   Cardiovascular: Positive for chest pain. Negative for palpitations.  Gastrointestinal: Negative for abdominal pain, blood in stool and bowel incontinence.  Genitourinary: Negative for bladder incontinence, dysuria, frequency and hematuria.  Musculoskeletal: Positive for back pain. Negative for arthralgias.    Skin: Negative.   Neurological: Negative for dizziness, seizures and speech difficulty.  Psychiatric/Behavioral: Negative for hallucinations and confusion.    Allergies  Review of patient's allergies indicates no known allergies.  Home Medications   Current Outpatient Rx  Name Route Sig Dispense Refill  . CALCIUM PO Oral Take 1 tablet by mouth daily.    Marland Kitchen VITAMIN D PO Oral Take 1 tablet by mouth daily.    . OXYCODONE-ACETAMINOPHEN 5-325 MG PO TABS Oral Take 0.5 tablets by mouth every 8 (eight) hours as needed.      BP 142/96  Pulse 72  Temp 98.7 F (37.1 C) (Oral)  Resp 20  Wt 115 lb (52.164 kg)  SpO2 96%  Physical Exam  Nursing note and vitals reviewed. Constitutional: He is oriented to person, place, and time. He appears well-developed and well-nourished.  Non-toxic appearance.  HENT:  Head: Normocephalic.  Right Ear: Tympanic membrane and external ear normal.  Left Ear: Tympanic membrane and external ear normal.  Eyes: EOM and lids are normal. Pupils are equal, round, and reactive to light.  Neck: Normal range of motion. Neck supple. Carotid bruit is not present.  Cardiovascular: Normal rate, regular rhythm, normal heart sounds, intact distal pulses and normal pulses.   Pulmonary/Chest: No respiratory distress. He has rhonchi.  Abdominal: Soft. Bowel sounds are normal. There is no tenderness. There is no guarding.  Musculoskeletal: Normal range of motion.       Lumbar area pain with change of position and paraspinal tenderness with  palpation.  Lymphadenopathy:       Head (right side): No submandibular adenopathy present.       Head (left side): No submandibular adenopathy present.    He has no cervical adenopathy.  Neurological: He is alert and oriented to person, place, and time. He has normal strength. No cranial nerve deficit or sensory deficit. He exhibits normal muscle tone. Coordination normal.       No acute deficit.  Skin: Skin is warm and dry.  Psychiatric:  He has a normal mood and affect. His speech is normal.    ED Course  Procedures (including critical care time)  Labs Reviewed - No data to display Dg Lumbar Spine Complete  10/04/2011  *RADIOLOGY REPORT*  Clinical Data: Back pain  LUMBAR SPINE - COMPLETE 4+ VIEW  Comparison: None.  Findings: Bilateral L5 pars defects and grade 1 L5-S1 spondylolisthesis.  5 mm anterolisthesis L5 upon S1.  Moderate narrowing at L5 S1.  No vertebral compression deformity.  IMPRESSION: L5 pars defects and grade 1 L5-S1 spondylolisthesis.  No acute bony pathology.  Degenerative change.   Original Report Authenticated By: Donavan Burnet, M.D.      No diagnosis found.    MDM  I have reviewed nursing notes, vital signs, and all appropriate lab and imaging results for this patient. Pt has hx of chronic back pain with aggravation by lifting a heavy object 2 weeks ago. L spine xray on 9/14 revealed Mod narrowing at L5S1 and some spondylolisthesis. No reported falls or loss of bowel/bladder. Pt advised to see orthopedic MD. Rx f0r dexamethasone, baclofen, and phenergan/codeine syrup . Pt to call Dr Jerl Santos for additional evaluation of his back.       Kathie Dike, PA 10/06/11 1610  Kathie Dike, PA 10/06/11 [phone]

## 2011-10-07 NOTE — ED Provider Notes (Signed)
Medical screening examination/treatment/procedure(s) were performed by non-physician practitioner and as supervising physician I was immediately available for consultation/collaboration.   Shelda Jakes, MD 10/07/11 2128

## 2011-10-17 ENCOUNTER — Emergency Department (HOSPITAL_COMMUNITY)
Admission: EM | Admit: 2011-10-17 | Discharge: 2011-10-17 | Disposition: A | Discharge status: Home / Self Care | Payer: Self-pay | Attending: Emergency Medicine | Admitting: Emergency Medicine

## 2011-10-17 ENCOUNTER — Encounter (HOSPITAL_COMMUNITY): Payer: Self-pay | Admitting: *Deleted

## 2011-10-17 DIAGNOSIS — I10 Essential (primary) hypertension: Secondary | ICD-10-CM | POA: Insufficient documentation

## 2011-10-17 DIAGNOSIS — I252 Old myocardial infarction: Secondary | ICD-10-CM | POA: Insufficient documentation

## 2011-10-17 DIAGNOSIS — M549 Dorsalgia, unspecified: Secondary | ICD-10-CM | POA: Insufficient documentation

## 2011-10-17 DIAGNOSIS — G8929 Other chronic pain: Secondary | ICD-10-CM | POA: Insufficient documentation

## 2011-10-17 DIAGNOSIS — F17200 Nicotine dependence, unspecified, uncomplicated: Secondary | ICD-10-CM | POA: Insufficient documentation

## 2011-10-17 DIAGNOSIS — K5900 Constipation, unspecified: Secondary | ICD-10-CM | POA: Insufficient documentation

## 2011-10-17 MED ORDER — HYDROCODONE-ACETAMINOPHEN 7.5-325 MG PO TABS: 1.0000 | ORAL_TABLET | ORAL | Status: AC | PRN

## 2011-10-17 MED ORDER — MORPHINE SULFATE 4 MG/ML IJ SOLN
8.0000 mg | Freq: Once | INTRAMUSCULAR | Status: AC
  Administered 2011-10-17: 8 mg via INTRAMUSCULAR
  Filled 2011-10-17: qty 2

## 2011-10-17 MED ORDER — SODIUM CHLORIDE 0.9 % IV SOLN: Freq: Once | INTRAVENOUS | Status: DC

## 2011-10-17 MED ORDER — MAGNESIUM HYDROXIDE 400 MG/5ML PO SUSP
30.0000 mL | Freq: Once | ORAL | Status: AC
  Administered 2011-10-17: 30 mL via ORAL
  Filled 2011-10-17: qty 30

## 2011-10-17 MED ORDER — BACLOFEN 10 MG PO TABS: 10.0000 mg | ORAL_TABLET | Freq: Three times a day (TID) | ORAL | Status: DC

## 2011-10-17 MED ORDER — ONDANSETRON HCL 4 MG PO TABS
4.0000 mg | ORAL_TABLET | Freq: Once | ORAL | Status: AC
  Administered 2011-10-17: 4 mg via ORAL
  Filled 2011-10-17: qty 1

## 2011-10-17 MED ORDER — METHOCARBAMOL 500 MG PO TABS
1000.0000 mg | ORAL_TABLET | Freq: Once | ORAL | Status: AC
  Administered 2011-10-17: 1000 mg via ORAL
  Filled 2011-10-17: qty 2

## 2011-10-17 NOTE — ED Notes (Signed)
Back pain x4 weeks, says he is no better, after being seen here .

## 2011-10-17 NOTE — ED Provider Notes (Signed)
History     CSN: [id number]  Arrival date & time 10/17/11  4098   First MD Initiated Contact with Patient 10/17/11 1938      Chief Complaint  Patient presents with  . Back Pain    (Consider location/radiation/quality/duration/timing/severity/associated sxs/prior treatment) HPI Comments: Patient was seen in the emergency department approximately 1-1/2 weeks ago for problems with his back. At that time he states that he had been doing some lifting pushing and pulling and injured his lower back. The patient had an x-ray approximately 2 weeks prior to the above listed visit and the x-rays showed spondylolisthesis and degenerative disc disease. The patient was referred to orthopedics for additional evaluation. The patient states that he did not go to see the orthopedist because he is" trying to put some money together". The patient returns today because he says his back is no better and he is out of pain medications. He denies any loss of bowel or bladder function. There's been no falls. Patient states he's had some problems with constipation since being on medications from his last visit. The patient did have an episode of vomiting earlier today but no other abdominal pain or near syncope or change in bowel or bladder function.  Patient is a 41 y.o. male presenting with back pain. The history is provided by the patient.  Back Pain  This is a chronic problem. The problem occurs daily. The problem has been gradually worsening. The pain is associated with lifting heavy objects. The pain is present in the lumbar spine. The pain radiates to the left thigh. The pain is moderate. The symptoms are aggravated by certain positions. The pain is the same all the time. Pertinent negatives include no fever, no bowel incontinence, no perianal numbness and no bladder incontinence. He has tried nothing for the symptoms.    Past Medical History  Diagnosis Date  . Myocardial infarction   . Hypertension   .  Chronic back pain     History reviewed. No pertinent past surgical history.  History reviewed. No pertinent family history.  History  Substance Use Topics  . Smoking status: Current Every Day Smoker    Types: Cigarettes  . Smokeless tobacco: Not on file  . Alcohol Use: Yes     3x a week a 6 pack      Review of Systems  Constitutional: Negative for fever.  Gastrointestinal: Positive for vomiting and constipation. Negative for bowel incontinence.  Genitourinary: Negative for bladder incontinence and difficulty urinating.  Musculoskeletal: Positive for back pain.  Neurological: Negative for syncope.    Allergies  Review of patient's allergies indicates no known allergies.  Home Medications   Current Outpatient Rx  Name Route Sig Dispense Refill  . CALCIUM PO Oral Take 1 tablet by mouth daily.    Marland Kitchen VITAMIN D PO Oral Take 1 tablet by mouth daily.    Marland Kitchen PROMETHAZINE-CODEINE 6.25-10 MG/5ML PO SYRP  10 ml po q6h prn pain 240 mL 0    BP 112/78  Pulse 101  Temp 99 F (37.2 C) (Oral)  Ht 5\' 10"  (1.778 m)  Wt 115 lb (52.164 kg)  BMI 16.50 kg/m2  SpO2 100%  Physical Exam  Nursing note and vitals reviewed. Constitutional: He is oriented to person, place, and time. He appears well-developed and well-nourished.  Non-toxic appearance.  HENT:  Head: Normocephalic.  Right Ear: Tympanic membrane and external ear normal.  Left Ear: Tympanic membrane and external ear normal.  Eyes: EOM and lids are normal.  Pupils are equal, round, and reactive to light.  Neck: Normal range of motion. Neck supple. Carotid bruit is not present.  Cardiovascular: Normal rate, regular rhythm, normal heart sounds, intact distal pulses and normal pulses.   Pulmonary/Chest: Breath sounds normal. No respiratory distress.  Abdominal: Soft. Bowel sounds are normal. There is no guarding.       There is pain to palpation in the lower mid abdomen. There is no pulsatile mass appreciated. Bowel sounds are present  and active. There is no change in the pain with change of position. There is no pain unless the area is being palpated.  Musculoskeletal: Normal range of motion.       There is pain to the lower lumbar area. There is paraspinal pain and tenderness to palpation and with change of position and with attempted range of motion.  Lymphadenopathy:       Head (right side): No submandibular adenopathy present.       Head (left side): No submandibular adenopathy present.    He has no cervical adenopathy.  Neurological: He is alert and oriented to person, place, and time. He has normal strength. No cranial nerve deficit or sensory deficit. He exhibits normal muscle tone. Coordination normal.  Skin: Skin is warm and dry.  Psychiatric: He has a normal mood and affect. His speech is normal.    ED Course  Procedures (including critical care time)  Labs Reviewed - No data to display No results found.   1. Back pain   2. Constipation       MDM  I have reviewed nursing notes, vital signs, and all appropriate lab and imaging results for this patient. Patient has diagnosis of spondylolisthesis and degenerative disc disease by plain x-ray. He has been referred to orthopedics but has not been to see them as of yet. The patient has had some problem with constipation. It is thought that this may be related to the medications that he was given. The examination is negative for acute deficit or changes. The patient is seen with me by Dr. Rosalia Hammers. The plan at this time is for the patient to be given Mobic 7.5 mg and baclofen 3 times daily for pain. Patient strongly encouraged to see the orthopedic referral given on his last visit.       Kathie Dike, [place] 10/17/11 [phone]

## 2011-10-17 NOTE — ED Notes (Signed)
Pt states back pain for 4 weeks since lifting a lawnmower. States pain all in his lower back, denies radiation of the pain anywhere else. States hes been seen here 2 other times for it and has had imaging of his back, states no relief of symptoms.

## 2011-10-17 NOTE — ED Provider Notes (Signed)
  I performed a history and physical examination of Jerome Washington and discussed his management with Ivery Quale.  I agree with the history, physical, assessment, and plan of care, with the following exceptions: None  I was present for the following procedures: None Time Spent in Critical Care of the patient: None Time spent in discussions with the patient and family: 5  Katrenia Alkins Corlis Leak, MD 10/17/11 2122

## 2011-10-17 NOTE — Discharge Instructions (Signed)
Please see Dr Jerl Santos or the orthopedic MD of your choice for evaluation of your ongoing back pain. Please establish a primary MD. Apply heat to the lower back. Baclofen three times daily for spasm pain. Norco every 4 hours if needed for pain. These medications may cause drowsiness and constipation. Please use the stool sofener of your choice while on these medications.Back Pain, Adult Low back pain is very common. About 1 in 5 people have back pain.The cause of low back pain is rarely dangerous. The pain often gets better over time.About half of people with a sudden onset of back pain feel better in just 2 weeks. About 8 in 10 people feel better by 6 weeks.  CAUSES Some common causes of back pain include:  Strain of the muscles or ligaments supporting the spine.   Wear and tear (degeneration) of the spinal discs.   Arthritis.   Direct injury to the back.  DIAGNOSIS Most of the time, the direct cause of low back pain is not known.However, back pain can be treated effectively even when the exact cause of the pain is unknown.Answering your caregiver's questions about your overall health and symptoms is one of the most accurate ways to make sure the cause of your pain is not dangerous. If your caregiver needs more information, he or she may order lab work or imaging tests (X-rays or MRIs).However, even if imaging tests show changes in your back, this usually does not require surgery. HOME CARE INSTRUCTIONS For many people, back pain returns.Since low back pain is rarely dangerous, it is often a condition that people can learn to [facility] their own.   Remain active. It is stressful on the back to sit or stand in one place. Do not sit, drive, or stand in one place for more than 30 minutes at a time. Take short walks on level surfaces as soon as pain allows.Try to increase the length of time you walk each day.   Do not stay in bed.Resting more than 1 or 2 days can delay your recovery.   Do  not avoid exercise or work.Your body is made to move.It is not dangerous to be active, even though your back may hurt.Your back will likely heal faster if you return to being active before your pain is gone.   Pay attention to your body when you bend and lift. Many people have less discomfortwhen lifting if they bend their knees, keep the load close to their bodies,and avoid twisting. Often, the most comfortable positions are those that put less stress on your recovering back.   Find a comfortable position to sleep. Use a firm mattress and lie on your side with your knees slightly bent. If you lie on your back, put a pillow under your knees.   Only take over-the-counter or prescription medicines as directed by your caregiver. Over-the-counter medicines to reduce pain and inflammation are often the most helpful.Your caregiver may prescribe muscle relaxant drugs.These medicines help dull your pain so you can more quickly return to your normal activities and healthy exercise.   Put ice on the injured area.   Put ice in a plastic bag.   Place a towel between your skin and the bag.   Leave the ice on for 15 to 20 minutes, 3 to 4 times a day for the first 2 to 3 days. After that, ice and heat may be alternated to reduce pain and spasms.   Ask your caregiver about trying back exercises and gentle massage.  This may be of some benefit.   Avoid feeling anxious or stressed.Stress increases muscle tension and can worsen back pain.It is important to recognize when you are anxious or stressed and learn ways to manage it.Exercise is a great option.  SEEK MEDICAL CARE IF:  You have pain that is not relieved with rest or medicine.   You have pain that does not improve in 1 week.   You have new symptoms.   You are generally not feeling well.  SEEK IMMEDIATE MEDICAL CARE IF:   You have pain that radiates from your back into your legs.   You develop new bowel or bladder control problems.    You have unusual weakness or numbness in your arms or legs.   You develop nausea or vomiting.   You develop abdominal pain.   You feel faint.  Document Released: 01/06/2005 Document Revised: 12/26/2010 Document Reviewed: 05/27/2010 [facility] Patient Information 2012 [place], [place].

## 2011-11-12 ENCOUNTER — Emergency Department (HOSPITAL_COMMUNITY)
Admission: EM | Admit: 2011-11-12 | Discharge: 2011-11-13 | Disposition: A | Discharge status: Home / Self Care | Payer: Self-pay | Attending: Emergency Medicine | Admitting: Emergency Medicine

## 2011-11-12 ENCOUNTER — Encounter (HOSPITAL_COMMUNITY): Payer: Self-pay | Admitting: *Deleted

## 2011-11-12 DIAGNOSIS — Y929 Unspecified place or not applicable: Secondary | ICD-10-CM | POA: Insufficient documentation

## 2011-11-12 DIAGNOSIS — I10 Essential (primary) hypertension: Secondary | ICD-10-CM | POA: Insufficient documentation

## 2011-11-12 DIAGNOSIS — Z23 Encounter for immunization: Secondary | ICD-10-CM | POA: Insufficient documentation

## 2011-11-12 DIAGNOSIS — X12XXXA Contact with other hot fluids, initial encounter: Secondary | ICD-10-CM | POA: Insufficient documentation

## 2011-11-12 DIAGNOSIS — I252 Old myocardial infarction: Secondary | ICD-10-CM | POA: Insufficient documentation

## 2011-11-12 DIAGNOSIS — G8929 Other chronic pain: Secondary | ICD-10-CM | POA: Insufficient documentation

## 2011-11-12 DIAGNOSIS — T22219A Burn of second degree of unspecified forearm, initial encounter: Secondary | ICD-10-CM | POA: Insufficient documentation

## 2011-11-12 DIAGNOSIS — T300 Burn of unspecified body region, unspecified degree: Secondary | ICD-10-CM

## 2011-11-12 DIAGNOSIS — Y9389 Activity, other specified: Secondary | ICD-10-CM | POA: Insufficient documentation

## 2011-11-12 DIAGNOSIS — X131XXA Other contact with steam and other hot vapors, initial encounter: Secondary | ICD-10-CM | POA: Insufficient documentation

## 2011-11-12 DIAGNOSIS — F17200 Nicotine dependence, unspecified, uncomplicated: Secondary | ICD-10-CM | POA: Insufficient documentation

## 2011-11-12 MED ORDER — SILVER SULFADIAZINE 1 % EX CREA
TOPICAL_CREAM | Freq: Two times a day (BID) | CUTANEOUS | Status: DC
  Administered 2011-11-12: via TOPICAL
  Filled 2011-11-12: qty 50

## 2011-11-12 MED ORDER — HYDROCODONE-ACETAMINOPHEN 5-325 MG PO TABS
2.0000 | ORAL_TABLET | Freq: Once | ORAL | Status: AC
  Administered 2011-11-12: 2 via ORAL
  Filled 2011-11-12: qty 2
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
## 2011-11-13 MED ORDER — TETANUS-DIPHTH-ACELL PERTUSSIS 5-2.5-18.5 LF-MCG/0.5 IM SUSP
0.5000 mL | Freq: Once | INTRAMUSCULAR | Status: AC
  Administered 2011-11-13: 0.5 mL via INTRAMUSCULAR
  Filled 2011-11-13: qty 0.5

## 2011-11-13 MED ORDER — HYDROCODONE-ACETAMINOPHEN 5-325 MG PO TABS: 1.0000 | ORAL_TABLET | ORAL | Status: AC | PRN

## 2011-11-13 NOTE — ED Notes (Signed)
Patient teaching when doing original dressing.  Repeated understanding of showering / washing burn and application of Silvadene Cream, re-dressing and will return here in 48 hours to have burn re-assessed.

## 2011-11-15 ENCOUNTER — Emergency Department (HOSPITAL_COMMUNITY)
Admission: EM | Admit: 2011-11-15 | Discharge: 2011-11-15 | Disposition: A | Discharge status: Home / Self Care | Payer: Self-pay | Attending: Emergency Medicine | Admitting: Emergency Medicine

## 2011-11-15 ENCOUNTER — Encounter (HOSPITAL_COMMUNITY): Payer: Self-pay | Admitting: *Deleted

## 2011-11-15 DIAGNOSIS — I252 Old myocardial infarction: Secondary | ICD-10-CM | POA: Insufficient documentation

## 2011-11-15 DIAGNOSIS — Z79899 Other long term (current) drug therapy: Secondary | ICD-10-CM | POA: Insufficient documentation

## 2011-11-15 DIAGNOSIS — M545 Low back pain, unspecified: Secondary | ICD-10-CM | POA: Insufficient documentation

## 2011-11-15 DIAGNOSIS — I10 Essential (primary) hypertension: Secondary | ICD-10-CM | POA: Insufficient documentation

## 2011-11-15 DIAGNOSIS — G8929 Other chronic pain: Secondary | ICD-10-CM | POA: Insufficient documentation

## 2011-11-15 DIAGNOSIS — F17200 Nicotine dependence, unspecified, uncomplicated: Secondary | ICD-10-CM | POA: Insufficient documentation

## 2011-11-15 DIAGNOSIS — Z4800 Encounter for change or removal of nonsurgical wound dressing: Secondary | ICD-10-CM | POA: Insufficient documentation

## 2011-11-15 DIAGNOSIS — IMO0001 Reserved for inherently not codable concepts without codable children: Secondary | ICD-10-CM

## 2011-11-15 MED ORDER — OXYCODONE-ACETAMINOPHEN 5-325 MG PO TABS
1.0000 | ORAL_TABLET | Freq: Once | ORAL | Status: AC
  Administered 2011-11-15: 1 via ORAL
  Filled 2011-11-15: qty 1

## 2011-11-15 MED ORDER — OXYCODONE-ACETAMINOPHEN 5-325 MG PO TABS: ORAL_TABLET | ORAL | Status: DC

## 2011-11-15 MED ORDER — IBUPROFEN 800 MG PO TABS
800.0000 mg | ORAL_TABLET | Freq: Once | ORAL | Status: AC
  Administered 2011-11-15: 800 mg via ORAL
  Filled 2011-11-15: qty 1
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
## 2011-11-15 NOTE — ED Notes (Signed)
Patient with no complaints at this time. Respirations even and unlabored. Skin warm/dry. Discharge instructions reviewed with patient at this time. Patient given opportunity to voice concerns/ask questions. Patient discharged at this time and left Emergency Department with steady gait.   

## 2011-11-15 NOTE — ED Provider Notes (Signed)
Medical screening examination/treatment/procedure(s) were performed by non-physician practitioner and as supervising physician I was immediately available for consultation/collaboration.  Kurk Corniel, MD 11/15/11 1510 

## 2012-08-23 ENCOUNTER — Other Ambulatory Visit (HOSPITAL_COMMUNITY): Payer: Self-pay | Admitting: *Deleted

## 2012-08-23 ENCOUNTER — Ambulatory Visit (HOSPITAL_COMMUNITY)
Admission: RE | Admit: 2012-08-23 | Discharge: 2012-08-23 | Disposition: A | Discharge status: Home / Self Care | Payer: Self-pay | Source: Ambulatory Visit | Attending: Family Medicine | Admitting: Family Medicine

## 2012-08-23 DIAGNOSIS — M549 Dorsalgia, unspecified: Secondary | ICD-10-CM

## 2012-08-23 DIAGNOSIS — M545 Low back pain, unspecified: Secondary | ICD-10-CM | POA: Insufficient documentation

## 2012-08-23 DIAGNOSIS — M5030 Other cervical disc degeneration, unspecified cervical region: Secondary | ICD-10-CM | POA: Insufficient documentation

## 2012-08-23 DIAGNOSIS — M542 Cervicalgia: Secondary | ICD-10-CM | POA: Insufficient documentation

## 2012-08-23 DIAGNOSIS — M5137 Other intervertebral disc degeneration, lumbosacral region: Secondary | ICD-10-CM | POA: Insufficient documentation

## 2012-08-23 DIAGNOSIS — M51379 Other intervertebral disc degeneration, lumbosacral region without mention of lumbar back pain or lower extremity pain: Secondary | ICD-10-CM | POA: Insufficient documentation

## 2014-07-26 ENCOUNTER — Emergency Department (HOSPITAL_COMMUNITY)
Admission: EM | Admit: 2014-07-26 | Discharge: 2014-07-26 | Disposition: A | Discharge status: Home / Self Care | Payer: Self-pay | Attending: Emergency Medicine | Admitting: Emergency Medicine

## 2014-07-26 ENCOUNTER — Encounter (HOSPITAL_COMMUNITY): Payer: Self-pay | Admitting: Emergency Medicine

## 2014-07-26 DIAGNOSIS — Z79899 Other long term (current) drug therapy: Secondary | ICD-10-CM | POA: Insufficient documentation

## 2014-07-26 DIAGNOSIS — M5442 Lumbago with sciatica, left side: Secondary | ICD-10-CM

## 2014-07-26 DIAGNOSIS — M5440 Lumbago with sciatica, unspecified side: Secondary | ICD-10-CM | POA: Insufficient documentation

## 2014-07-26 DIAGNOSIS — G8929 Other chronic pain: Secondary | ICD-10-CM | POA: Insufficient documentation

## 2014-07-26 DIAGNOSIS — Z720 Tobacco use: Secondary | ICD-10-CM | POA: Insufficient documentation

## 2014-07-26 DIAGNOSIS — M5441 Lumbago with sciatica, right side: Secondary | ICD-10-CM

## 2014-07-26 DIAGNOSIS — I10 Essential (primary) hypertension: Secondary | ICD-10-CM | POA: Insufficient documentation

## 2014-07-26 DIAGNOSIS — I252 Old myocardial infarction: Secondary | ICD-10-CM | POA: Insufficient documentation

## 2014-07-26 MED ORDER — HYDROCODONE-ACETAMINOPHEN 5-325 MG PO TABS: 2.0000 | ORAL_TABLET | ORAL | Status: DC | PRN

## 2014-07-26 MED ORDER — HYDROCODONE-ACETAMINOPHEN 5-325 MG PO TABS
2.0000 | ORAL_TABLET | Freq: Once | ORAL | Status: AC
  Administered 2014-07-26: 2 via ORAL
  Filled 2014-07-26: qty 2

## 2014-07-26 NOTE — ED Provider Notes (Signed)
CSN: [id number]     Arrival date & time 07/26/14  0756 History   First MD Initiated Contact with Patient 07/26/14 0809     Chief Complaint  Patient presents with  . Back Pain     (Consider location/radiation/quality/duration/timing/severity/associated sxs/prior Treatment) Patient is a 44 y.o. male presenting with back pain. The history is provided by the patient. No language interpreter was used.  Back Pain Location:  Lumbar spine Quality:  Aching Radiates to:  L posterior upper leg and R posterior upper leg Pain severity:  Moderate Onset quality:  Gradual Timing:  Constant Progression:  Worsening Chronicity:  New Relieved by:  Nothing Worsened by:  Nothing tried Ineffective treatments:  Ibuprofen Associated symptoms: no abdominal pain   Risk factors: no hx of osteoporosis     Past Medical History  Diagnosis Date  . Myocardial infarction   . Hypertension   . Chronic back pain    History reviewed. No pertinent past surgical history. No family history on file. History  Substance Use Topics  . Smoking status: Current Every Day Smoker -- 1.00 packs/day    Types: Cigarettes  . Smokeless tobacco: Not on file  . Alcohol Use: Yes     Comment: 2x a week a 6 pack    Review of Systems  Gastrointestinal: Negative for abdominal pain.  Musculoskeletal: Positive for back pain.  All other systems reviewed and are negative.     Allergies  Review of patient's allergies indicates no known allergies.  Home Medications   Prior to Admission medications   Medication Sig Start Date End Date Taking? Authorizing Provider  CALCIUM PO Take 1 tablet by mouth daily.    Historical Provider, MD  Cholecalciferol (VITAMIN D PO) Take 1 tablet by mouth daily.    Historical Provider, MD  oxyCODONE-acetaminophen (PERCOCET/ROXICET) 5-325 MG per tablet One tab po q 6 hrs prn pain 11/15/11   Richard Paul HalfM Miller, PA-C   BP 151/112 mmHg  Pulse 94  Resp 14  SpO2 96% Physical Exam  Constitutional:  He is oriented to person, place, and time. He appears well-developed and well-nourished.  HENT:  Head: Normocephalic and atraumatic.  Eyes: Conjunctivae and EOM are normal. Pupils are equal, round, and reactive to light.  Neck: Normal range of motion.  Cardiovascular: Normal rate.   Pulmonary/Chest: Effort normal.  Abdominal: Soft. He exhibits no distension.  Musculoskeletal:  Diffusely tender ls spine  Pain with straight leg rasise.  Neurological: He is alert and oriented to person, place, and time.  Psychiatric: He has a normal mood and affect.  Nursing note and vitals reviewed.   ED Course  Procedures (including critical care time) Labs Review Labs Reviewed - No data to display  Imaging Review No results found.   EKG Interpretation None      MDM Pt reports he does not have an MD.  He has been denied dissability.   Final diagnoses:  Bilateral low back pain with sciatica, sciatica laterality unspecified    Pt last treated here for back pain in 2013.  I will treat acute pain.   He is advised he needs a primary MD.  Pt is given resource guide    Elson AreasLeslie K Sofia, PA-C 07/26/14 0824  Elson AreasLeslie K Sofia, PA-C 07/26/14 0825  Gilda Creasehristopher J Pollina, MD 07/26/14 [phone]

## 2014-07-26 NOTE — ED Notes (Signed)
Pt taking ibuprofen without relief

## 2014-07-26 NOTE — Discharge Instructions (Signed)
Back Pain, Adult °Back pain is very common. The pain often gets better over time. The cause of back pain is usually not dangerous. Most people can learn to manage their back pain on their own.  °HOME CARE  °· Stay active. Start with short walks on flat ground if you can. Try to walk farther each day. °· Do not sit, drive, or stand in one place for more than 30 minutes. Do not stay in bed. °· Do not avoid exercise or work. Activity can help your back heal faster. °· Be careful when you bend or lift an object. Bend at your knees, keep the object close to you, and do not twist. °· Sleep on a firm mattress. Lie on your side, and bend your knees. If you lie on your back, put a pillow under your knees. °· Only take medicines as told by your doctor. °· Put ice on the injured area. °¨ Put ice in a plastic bag. °¨ Place a towel between your skin and the bag. °¨ Leave the ice on for 15-20 minutes, 03-04 times a day for the first 2 to 3 days. After that, you can switch between ice and heat packs. °· Ask your doctor about back exercises or massage. °· Avoid feeling anxious or stressed. Find good ways to deal with stress, such as exercise. °GET HELP RIGHT AWAY IF:  °· Your pain does not go away with rest or medicine. °· Your pain does not go away in 1 week. °· You have new problems. °· You do not feel well. °· The pain spreads into your legs. °· You cannot control when you poop (bowel movement) or pee (urinate). °· Your arms or legs feel weak or lose feeling (numbness). °· You feel sick to your stomach (nauseous) or throw up (vomit). °· You have belly (abdominal) pain. °· You feel like you may pass out (faint). °MAKE SURE YOU:  °· Understand these instructions. °· Will watch your condition. °· Will get help right away if you are not doing well or get worse. °Document Released: 06/25/2007 Document Revised: 03/31/2011 Document Reviewed: 05/10/2013 °ExitCare® Patient Information ©2015 [employer]. This information is not intended  to replace advice given to you by your health care provider. Make sure you discuss any questions you have with your health care provider. ° ° °Emergency Department Resource Guide °1) Find a Doctor and Pay Out of Pocket °Although you won't have to find out who is covered by your insurance plan, it is a good idea to ask around and get recommendations. You will then need to call the office and see if the doctor you have chosen will accept you as a new patient and what types of options they offer for patients who are self-pay. Some doctors offer discounts or will set up payment plans for their patients who do not have insurance, but you will need to ask so you aren't surprised when you get to your appointment. ° °2) Contact Your Local Health Department °Not all health departments have doctors that can see patients for sick visits, but many do, so it is worth a call to see if yours does. If you don't know where your local health department is, you can check in your phone book. The CDC also has a tool to help you locate your state's health department, and many state websites also have listings of all of their local health departments. ° °3) Find a Walk-in Clinic °If your illness is not likely to be   very severe or complicated, you may want to try a walk in clinic. These are popping up all over the country in pharmacies, drugstores, and shopping centers. They're usually staffed by nurse practitioners or physician assistants that have been trained to treat common illnesses and complaints. They're usually fairly quick and inexpensive. However, if you have serious medical issues or chronic medical problems, these are probably not your best option. ° °No Primary Care Doctor: °- Call [facility] at  832-8000 - they can help you locate a primary care doctor that  accepts your insurance, provides certain services, etc. °- Physician Referral Service- 1-800-533-3463 ° °Chronic Pain Problems: °Organization         Address  Phone    Notes  °[facility]  (336) 297-2271 Patients need to be referred by their primary care doctor.  ° °Medication Assistance: °Organization         Address  Phone   Notes  °[place] [facility] [address] °[place], [place] [address] (336) 641-8030 --Must be a resident of [place] °-- Must have NO insurance coverage whatsoever (no Medicaid/ Medicare, etc.) °-- The pt. MUST have a primary care doctor that directs their care regularly and follows them in the community °  °[facility]  (866) 331-1348   °[employer]  (888) 892-1162   ° °Agencies that provide inexpensive medical care: °Organization         Address  Phone   Notes  °[facility]  (336) 832-8035   °[facility]    (336) 832-7272   °[facility] [address] °[place], [place] [address] (336) 832-4777   °[facility] [address], °[place] (336) 271-4999   °[facility]    (336) 373-0678   °[facility]    (336) 272-1050   °[facility] ° [address], [place] Phone:  (336) 832-4444, Fax:  (336) 832-4440 Hours of Operation:  9 am - 6 pm, M-F.  Also accepts Medicaid/Medicare and self-pay.  °[facility] ° [address], [place] Phone: (336) 832-3150, Fax: (336) 832-3151. Hours of Operation:  8:30 am - 5:30 pm, M-F.  Also accepts Medicaid and self-pay.  °[facility] [place] [address], [place] Phone: (336) 878-6027   °[facility] [address], [place], [place] (336)723-1848, Ext. 123 Mondays & Thursdays: 7-9 AM.  First 15 patients are seen on a first come, first serve basis. °  ° °Medicaid-accepting [place] Providers: ° °Organization         Address  Phone   Notes  °[facility] [address], [place] (336) 641-2100 Also accepts self-pay patients.  °[facility]  [address], [place] ° (336) 856-9996   °[facility] [address], [place] (336) 288-8857   °[facility] [address] [place] [address],  (336) 299-7000   °Veita Bland [address],   ° (336) 373-1557 Only accepts [place] Access Medicaid patients after they have their name applied to their card.  ° °Self-Pay (no insurance) in [place]: ° °Organization         Address  Phone   Notes  °Sickle Cell Patients, [facility] [address],  (336) 832-1970   ° [facility]   [address], [place] (336) 832-4400   °[facility] ° [address], [place] (336) 992-4800   °[facility]/Dr. Osei-Bonsu ° [address] [place] [address], [place] or [address], [place] (336) 841-8500 Phone number for both [place] and [place] locations is the same.  °[facility] [address], [place] (336) 299-0000   °[facility] [address] [place] [address], [place] or [address] (336) 852-7530 °(336) 878-2260   °[facility] [address], [place] (336) 350-1642, phone; (336) 294-5005, fax Sees patients 1st and 3rd Saturday of every month.  Must not qualify for public or private insurance (i.e. Medicaid, Medicare, [place] Health Choice, Veterans' Benefits) • Household income should be no more than 200% of the poverty level •The clinic cannot treat you if you are pregnant or think you are pregnant • Sexually transmitted diseases are not treated at the clinic.  ° ° °Dental Care: °Organization         Address  Phone  Notes  °[facility] [facility] [address], [place] (336) 641-6152 Accepts children up to age 21 who are enrolled in Medicaid or [place] Health Choice; pregnant women with a Medicaid card; and children who have  applied for Medicaid or [place] Health Choice, but were declined, whose parents can pay a reduced fee at time of service.  °[facility] [place]  [address], [place] (336) 641-7733 Accepts children up to age 21 who are enrolled in Medicaid or [place] Health Choice; pregnant women with a Medicaid card; and children who have applied for Medicaid or [place] Health Choice, but were declined, whose parents can pay a reduced fee at time of service.  °[facility] ° [address], [place] (336) 641-4533 Patients are seen by appointment only. Walk-ins are not accepted. [facility] will see patients 18 years of age and older. °Monday - Tuesday (8am-5pm) °Most Wednesdays (8:30-5pm) °$30 per visit, cash only  °[facility] ° [address], [place] (336) 641-4533 Patients are seen by appointment only. Walk-ins are not accepted. [facility] will see patients 18 years of age and older. °One Wednesday Evening (Monthly: Volunteer Based).  $30 per visit, cash only  °[facility]  (919) 537-3737 for adults; Children under age 4, call Graduate Pediatric Dentistry at (919) [phone]. Children aged 4-14, please call (919) [phone] to request a pediatric application. ° Dental services are provided in all areas of dental care including fillings, crowns and bridges, complete and partial dentures, implants, gum treatment, root canals, and extractions. Preventive care is also provided. Treatment is provided to both adults and children. °Patients are selected via a lottery and there is often a waiting list. °  °[facility] [address], °[place] ° (336) 763-8833 [email or web address] °  °[facility] [address], [place], [place] (336)723-1848, Ext. 123 Second and Fourth Thursday of each month, opens at 6:30 AM; Clinic ends at 9 AM.  Patients are seen on a first-come first-served basis, and a  limited number are seen during each clinic.  ° °[facility] ° [address], [place],  (336) 723-7904   Eligibility Requirements °You must have lived in [place], [place], or [place] counties for at least the last three months. °    You cannot be eligible for state or federal sponsored healthcare insurance, including Veterans Administration, Medicaid, or Medicare. °  You generally cannot be eligible for healthcare insurance through your employer.  °  How to apply: °Eligibility screenings are held every Tuesday and Wednesday afternoon from 1:00 pm until 4:00 pm. You do not need an appointment for the interview!  °[facility] [address], [place], [place] 336-631-2330   °[facility]  336-342-8273   °[facility]  336-703-3100   °[facility]  336-570-6415   ° °Behavioral Health Resources in the Community: °Intensive Outpatient Programs °Organization         Address  Phone  Notes  °[facility] [address], [place], [place] 336-878-6098   °[facility] [address], [place], [place] 336-832-9800   °[facility] [address], [place], [place] ° 336-882-2125   °[facility] [address],  °[place], [place] 1-800-853-5163 or 336-641-4981   °Substance Abuse Resources °Organization         Address  Phone  Notes  °[facility]  336-882-2125   °[facility]  336-784-9470   °[facility]  336-285-9073   °[facility]  336-845-3988   °Residential & Outpatient Substance Abuse Program  1-800-659-3381   °Psychological Services °Organization         Address  Phone  Notes  °[facility]  336- 832-9600   °[facility]  336- 378-7881   °[facility] [address], [place] 1-800-853-5163 or 336-641-4981   ° °Mobile Crisis Teams °Organization          Address  Phone  Notes  °[facility], Mobile Crisis Care Unit  1-877-626-1772   °Assertive °[facility] ° [address] [place], [place] 336-834-9664   °Sharon DeEsch [address] °[place] 336-554-5454   ° °Self-Help/Support Groups °Organization         Address  Phone             Notes  °[facility] - variety of support groups  336- 373-1402 Call for more information  °Narcotics Anonymous (NA), [facility] [address], °[place]  2 meetings at this location  ° °Residential Treatment Programs °Organization         Address  Phone  Notes  °[facility] [address],    °[place]  1-866-801-8205   °[facility] ° [address], [place], [place] 704-293-8524   °[facility] [address], [place] 336-845-3988 Admissions: 8am-3pm M-F  °[facility] [address]    °[place], [place] 336-841-1104   °[facility] [address], [place], [place] 336-379-7146   °[facility] [address]  °[place], [place] 336-285-9073   °[facility] - Intensive Outpatient [address], [place], [place] 336-852-3033   °[facility] [address]  °[place], [place] 1-877-615-2722 or 336-784-9470   °[facility] [address] [place], [place] 336-227-7417 Accepts Medicaid  °[facility] [address]  °[place] 1-800-659-3381 Substance Abuse/Addiction Treatment  ° °[place] Behavioral Health Resources °Organization         Address  Phone  Notes  °[facility]  (888) 581-9988   °Julie Brannon, PhD [address] [place], [place]   (336) 349-5553 or (336) 951-0000   °[facility]   [address] °[place], [place] (336) 349-4454   °[facility] [address], [place], [place] (336) 342-8316 Insurance/Medicaid/sponsorship  through [facility]  °[facility] [address]                                    [place], [place] (336) 342-8316 Therapy/tele-psych/case  °[facility] [address]  ° [place], [place] (336) 349-2233    °Dr. Arfeen  (336) 349-4544   °[facility] [place]  [employer] [place] [facility] 1) [address], [place] °2) [address], [place] °3)  [address], [place] (336) 349-3220 °(336) 342-7768 ° °(336) 342-8140   °[place] [facility] (336) 342-1394 or (336) 342-3537 (After Hours)    ° ° ° ° °

## 2014-07-26 NOTE — ED Notes (Signed)
Pt has hx of back pain, last 3 day pain has gotten worse, pain radiating down legs bilaterally,  With numbness

## 2014-07-26 NOTE — ED Notes (Signed)
Pt alert & oriented x4, stable gait. Patient given discharge instructions, paperwork & prescription(s). Patient  instructed to stop at the registration desk to finish any additional paperwork. Patient verbalized understanding. Pt left department w/ no further questions. 

## 2015-07-23 ENCOUNTER — Emergency Department (HOSPITAL_COMMUNITY): Payer: Medicaid Other

## 2015-07-23 ENCOUNTER — Encounter (HOSPITAL_COMMUNITY): Payer: Self-pay | Admitting: Emergency Medicine

## 2015-07-23 ENCOUNTER — Emergency Department (HOSPITAL_COMMUNITY)
Admission: EM | Admit: 2015-07-23 | Discharge: 2015-07-24 | Disposition: A | Discharge status: Home / Self Care | Payer: Medicaid Other | Attending: Emergency Medicine | Admitting: Emergency Medicine

## 2015-07-23 DIAGNOSIS — R109 Unspecified abdominal pain: Secondary | ICD-10-CM | POA: Insufficient documentation

## 2015-07-23 DIAGNOSIS — I10 Essential (primary) hypertension: Secondary | ICD-10-CM | POA: Diagnosis not present

## 2015-07-23 DIAGNOSIS — F17210 Nicotine dependence, cigarettes, uncomplicated: Secondary | ICD-10-CM | POA: Insufficient documentation

## 2015-07-23 DIAGNOSIS — I252 Old myocardial infarction: Secondary | ICD-10-CM | POA: Insufficient documentation

## 2015-07-23 DIAGNOSIS — R52 Pain, unspecified: Secondary | ICD-10-CM

## 2015-07-23 LAB — URINALYSIS, ROUTINE W REFLEX MICROSCOPIC
BILIRUBIN URINE: NEGATIVE
GLUCOSE, UA: NEGATIVE mg/dL
KETONES UR: NEGATIVE mg/dL
Leukocytes, UA: NEGATIVE
NITRITE: NEGATIVE
PH: 5.5 (ref 5.0–8.0)
Protein, ur: NEGATIVE mg/dL

## 2015-07-23 LAB — COMPREHENSIVE METABOLIC PANEL
ALK PHOS: 137 U/L — AB (ref 38–126)
ALT: 59 U/L (ref 17–63)
AST: 197 U/L — AB (ref 15–41)
Albumin: 4.3 g/dL (ref 3.5–5.0)
Anion gap: 13 (ref 5–15)
CALCIUM: 8.6 mg/dL — AB (ref 8.9–10.3)
CHLORIDE: 98 mmol/L — AB (ref 101–111)
CO2: 25 mmol/L (ref 22–32)
CREATININE: 0.54 mg/dL — AB (ref 0.61–1.24)
GFR calc non Af Amer: >60 mL/min (ref >60)
Glucose, Bld: 82 mg/dL (ref 65–99)
Potassium: 3.9 mmol/L (ref 3.5–5.1)
SODIUM: 136 mmol/L (ref 135–145)
Total Bilirubin: 1.3 mg/dL — ABNORMAL HIGH (ref 0.3–1.2)
Total Protein: 8 g/dL (ref 6.5–8.1)

## 2015-07-23 LAB — CBC WITH DIFFERENTIAL/PLATELET
BASOS ABS: 0.1 10*3/uL (ref 0.0–0.1)
Basophils Relative: 2 %
EOS ABS: 0.1 10*3/uL (ref 0.0–0.7)
Eosinophils Relative: 1 %
HCT: 45.5 % (ref 39.0–52.0)
HEMOGLOBIN: 15.8 g/dL (ref 13.0–17.0)
LYMPHS ABS: 2.3 10*3/uL (ref 0.7–4.0)
LYMPHS PCT: 41 %
MCH: 35.8 pg — AB (ref 26.0–34.0)
MCHC: 34.7 g/dL (ref 30.0–36.0)
MCV: 103.2 fL — AB (ref 78.0–100.0)
Monocytes Absolute: 0.7 10*3/uL (ref 0.1–1.0)
Monocytes Relative: 12 %
Neutro Abs: 2.5 10*3/uL (ref 1.7–7.7)
Neutrophils Relative %: 44 %
Platelets: 133 10*3/uL — ABNORMAL LOW (ref 150–400)
RBC: 4.41 MIL/uL (ref 4.22–5.81)
RDW: 12.6 % (ref 11.5–15.5)
WBC: 5.6 10*3/uL (ref 4.0–10.5)

## 2015-07-23 LAB — URINE MICROSCOPIC-ADD ON

## 2015-07-23 MED ORDER — TRAMADOL HCL 50 MG PO TABS: 50.0000 mg | ORAL_TABLET | Freq: Four times a day (QID) | ORAL | Status: DC | PRN

## 2015-07-23 MED ORDER — ONDANSETRON HCL 4 MG/2ML IJ SOLN
4.0000 mg | Freq: Once | INTRAMUSCULAR | Status: AC
  Administered 2015-07-23: 4 mg via INTRAVENOUS
  Filled 2015-07-23: qty 2

## 2015-07-23 MED ORDER — SODIUM CHLORIDE 0.9 % IV BOLUS (SEPSIS)
1000.0000 mL | Freq: Once | INTRAVENOUS | Status: AC
  Administered 2015-07-23: 1000 mL via INTRAVENOUS

## 2015-07-23 MED ORDER — HYDROMORPHONE HCL 1 MG/ML IJ SOLN
1.0000 mg | Freq: Once | INTRAMUSCULAR | Status: AC
  Administered 2015-07-23: 1 mg via INTRAVENOUS
  Filled 2015-07-23: qty 1

## 2015-07-23 NOTE — ED Provider Notes (Signed)
CSN: [id number]     Arrival date & time 07/23/15  2042 History  By signing my name below, I, Soijett Blue, attest that this documentation has been prepared under the direction and in the presence of Bethann BerkshireJoseph Tishie Altmann, MD. Electronically Signed: Soijett Blue, ED Scribe. 07/23/2015. 9:32 PM.  Chief Complaint  Patient presents with  . Flank Pain      Patient is a 45 y.o. male presenting with flank pain. The history is provided by the patient. No language interpreter was used.  Flank Pain This is a new problem. The current episode started more than 2 days ago (3 days). The problem occurs rarely. The problem has not changed since onset.Pertinent negatives include no chest pain, no abdominal pain and no headaches. Exacerbated by: palpation. The symptoms are relieved by rest. He has tried nothing for the symptoms. The treatment provided no relief.    HPI Comments: Gardiner FantiWilliam A Bice is a 45 y.o. male with a medical hx of HTN who presents to the Emergency Department complaining of constant, moderate, right flank pain onset 3 days. Pt denies having these symptoms in the past. He states that he is having associated symptoms of dark-colored urine. He states that he has not tried any medications for the relief for his symptoms. He denies appetite change, fatigue, ear discharge, sinus pressure, eye discharge, cough, CP, abdominal pain, diarrhea, urinary frequency, hematuria, back pain, rash, seizures, HA, hallucinations, and any other symptoms.    Past Medical History  Diagnosis Date  . Myocardial infarction (HCC)   . Hypertension   . Chronic back pain    History reviewed. No pertinent past surgical history. History reviewed. No pertinent family history. Social History  Substance Use Topics  . Smoking status: Current Every Day Smoker -- 1.00 packs/day    Types: Cigarettes  . Smokeless tobacco: None  . Alcohol Use: Yes     Comment: 2x a week a 6 pack    Review of Systems  Constitutional: Negative for  appetite change and fatigue.  HENT: Negative for congestion, ear discharge and sinus pressure.   Eyes: Negative for discharge.  Respiratory: Negative for cough.   Cardiovascular: Negative for chest pain.  Gastrointestinal: Negative for abdominal pain and diarrhea.  Genitourinary: Positive for flank pain (right). Negative for frequency and hematuria.       Dark-colored urine  Musculoskeletal: Negative for back pain.  Skin: Negative for rash.  Neurological: Negative for seizures and headaches.  Psychiatric/Behavioral: Negative for hallucinations.      Allergies  Review of patient's allergies indicates no known allergies.  Home Medications   Prior to Admission medications   Medication Sig Start Date End Date Taking? Authorizing Provider  CALCIUM PO Take 1 tablet by mouth daily.    Historical Provider, MD  Cholecalciferol (VITAMIN D PO) Take 1 tablet by mouth daily.    Historical Provider, MD  HYDROcodone-acetaminophen (NORCO/VICODIN) 5-325 MG per tablet Take 2 tablets by mouth every 4 (four) hours as needed. 07/26/14   Elson AreasLeslie K Sofia, PA-C   BP 151/115 mmHg  Pulse 137  Temp(Src) 99.1 F (37.3 C) (Oral)  Resp 20  Ht 6' (1.829 m)  Wt 140 lb (63.504 kg)  BMI 18.98 kg/m2  SpO2 99% Physical Exam  Constitutional: He is oriented to person, place, and time. He appears well-developed.  HENT:  Head: Normocephalic.  Eyes: Conjunctivae and EOM are normal. No scleral icterus.  Neck: Neck supple. No thyromegaly present.  Cardiovascular: Regular rhythm and normal heart sounds.  Tachycardia  present.  Exam reveals no gallop and no friction rub.   No murmur heard. Pulmonary/Chest: Effort normal and breath sounds normal. No stridor. He has no wheezes. He has no rales. He exhibits no tenderness.  Abdominal: He exhibits no distension. There is no tenderness. There is no rebound.  Moderate right flank tenderness.   Musculoskeletal: Normal range of motion. He exhibits no edema.  Lymphadenopathy:     He has no cervical adenopathy.  Neurological: He is oriented to person, place, and time. He exhibits normal muscle tone. Coordination normal.  Skin: No rash noted. No erythema.  Psychiatric: He has a normal mood and affect. His behavior is normal.  Nursing note and vitals reviewed.   ED Course  Procedures (including critical care time) DIAGNOSTIC STUDIES: Oxygen Saturation is 99% on RA, nl by my interpretation.    COORDINATION OF CARE: 9:20 PM Discussed treatment plan with pt at bedside which includes UA, CT renal stone study, labs, EKG, and pt agreed to plan.    Labs Review Labs Reviewed  CBC WITH DIFFERENTIAL/PLATELET - Abnormal; Notable for the following:    MCV 103.2 (*)    MCH 35.8 (*)    Platelets 133 (*)    All other components within normal limits  COMPREHENSIVE METABOLIC PANEL - Abnormal; Notable for the following:    Chloride 98 (*)    BUN <5 (*)    Creatinine, Ser 0.54 (*)    Calcium 8.6 (*)    AST 197 (*)    Alkaline Phosphatase 137 (*)    Total Bilirubin 1.3 (*)    All other components within normal limits  URINALYSIS, ROUTINE W REFLEX MICROSCOPIC (NOT AT [facility]) - Abnormal; Notable for the following:    Specific Gravity, Urine <1.005 (*)    Hgb urine dipstick TRACE (*)    All other components within normal limits  URINE MICROSCOPIC-ADD ON - Abnormal; Notable for the following:    Squamous Epithelial / LPF 0-5 (*)    Bacteria, UA RARE (*)    All other components within normal limits    Imaging Review Ct Renal Stone Study  07/23/2015  CLINICAL DATA:  Acute onset of right flank pain and dark urine. Initial encounter. EXAM: CT ABDOMEN AND PELVIS WITHOUT CONTRAST TECHNIQUE: Multidetector CT imaging of the abdomen and pelvis was performed following the standard protocol without IV contrast. COMPARISON:  None. FINDINGS: The visualized lung bases are clear. The liver and spleen are unremarkable in appearance. The gallbladder is within normal limits. The pancreas  and adrenal glands are unremarkable. The kidneys are unremarkable in appearance. There is no evidence of hydronephrosis. No renal or ureteral stones are seen. No perinephric stranding is appreciated. No free fluid is identified. The small bowel is unremarkable in appearance. The stomach is within normal limits. No acute vascular abnormalities are seen. Mild scattered calcification is seen along the distal abdominal aorta. The appendix is normal in caliber, without evidence of appendicitis. The colon is grossly unremarkable in appearance. The bladder is mildly distended and grossly unremarkable. The prostate is borderline normal in size. No inguinal lymphadenopathy is seen. No acute osseous abnormalities are identified. 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 IMPRESSION: 1. No acute abnormality seen to explain the patient's symptoms. 2. Mild scattered calcification along the distal abdominal aorta. 3. 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 Electronically Signed   By: Leotis ShamesJeffery  Chang M.D.   On: 07/23/2015 22:30   I have personally reviewed and evaluated these images and lab results as part of my medical decision-making.   EKG Interpretation None      MDM   Final diagnoses:  None    Suspect back pain is musculoskeletal pain.  CT scan urinalysis CBC unremarkable liver enzymes were mildly elevated. Patient is to follow-up with his doctor within the next few days. He will also need to have his blood pressure rechecked  The chart was scribed for me under my direct supervision.  I personally performed the history, physical, and medical decision making and all procedures in the evaluation of this patient.Bethann Berkshire, MD 07/23/15 [phone]

## 2015-07-23 NOTE — Discharge Instructions (Signed)
Follow up with your md next week for recheck °

## 2015-07-23 NOTE — ED Notes (Signed)
Pt states he is having R flank pain and that his urine has been "really dark".

## 2015-09-01 ENCOUNTER — Encounter (HOSPITAL_COMMUNITY): Payer: Self-pay

## 2015-09-01 ENCOUNTER — Emergency Department (HOSPITAL_COMMUNITY)
Admission: EM | Admit: 2015-09-01 | Discharge: 2015-09-01 | Disposition: A | Discharge status: Home / Self Care | Payer: Medicaid Other | Attending: Emergency Medicine | Admitting: Emergency Medicine

## 2015-09-01 DIAGNOSIS — F17210 Nicotine dependence, cigarettes, uncomplicated: Secondary | ICD-10-CM | POA: Insufficient documentation

## 2015-09-01 DIAGNOSIS — I252 Old myocardial infarction: Secondary | ICD-10-CM | POA: Diagnosis not present

## 2015-09-01 DIAGNOSIS — Z79899 Other long term (current) drug therapy: Secondary | ICD-10-CM | POA: Diagnosis not present

## 2015-09-01 DIAGNOSIS — K6289 Other specified diseases of anus and rectum: Secondary | ICD-10-CM | POA: Diagnosis present

## 2015-09-01 DIAGNOSIS — N492 Inflammatory disorders of scrotum: Secondary | ICD-10-CM | POA: Diagnosis not present

## 2015-09-01 DIAGNOSIS — I10 Essential (primary) hypertension: Secondary | ICD-10-CM | POA: Diagnosis not present

## 2015-09-01 MED ORDER — MORPHINE SULFATE (PF) 4 MG/ML IV SOLN
4.0000 mg | Freq: Once | INTRAVENOUS | Status: AC
  Administered 2015-09-01: 4 mg via INTRAMUSCULAR
  Filled 2015-09-01: qty 1

## 2015-09-01 MED ORDER — BUPIVACAINE HCL (PF) 0.5 % IJ SOLN
10.0000 mL | Freq: Once | INTRAMUSCULAR | Status: AC
  Administered 2015-09-01: 10 mL
  Filled 2015-09-01: qty 30

## 2015-09-01 MED ORDER — NAPROXEN 250 MG PO TABS: ORAL_TABLET | ORAL | 0 refills | Status: DC

## 2015-09-01 MED ORDER — SULFAMETHOXAZOLE-TRIMETHOPRIM 800-160 MG PO TABS: 1.0000 | ORAL_TABLET | Freq: Two times a day (BID) | ORAL | 0 refills | Status: DC

## 2015-09-01 MED ORDER — ONDANSETRON 4 MG PO TBDP
4.0000 mg | ORAL_TABLET | Freq: Once | ORAL | Status: AC
Start: 2015-09-01 — End: 2015-09-01
  Administered 2015-09-01: 4 mg via ORAL
  Filled 2015-09-01: qty 1

## 2015-09-01 MED ORDER — HYDROCODONE-ACETAMINOPHEN 5-325 MG PO TABS: 1.0000 | ORAL_TABLET | Freq: Four times a day (QID) | ORAL | 0 refills | Status: DC | PRN

## 2015-09-01 MED ORDER — SULFAMETHOXAZOLE-TRIMETHOPRIM 800-160 MG PO TABS
1.0000 | ORAL_TABLET | Freq: Once | ORAL | Status: AC
  Administered 2015-09-01: 1 via ORAL
  Filled 2015-09-01: qty 1

## 2015-09-01 NOTE — ED Provider Notes (Signed)
[facility]-EMERGENCY DEPT Provider Note   CSN: [id number] Arrival date & time: 09/01/15  [id number]  First Provider Contact:  First MD Initiated Contact with Patient 09/01/15 0533        History   Chief Complaint Chief Complaint  Patient presents with  . Rectal Pain    HPI Jerome Washington is a 45 y.o. male.  HPI patient reports 2-3 days ago he started getting some swelling in his rectal area. He states the pain is constant. He states it does not hurt more with defecation although it hurts when he tries to white. He denies any fever or chills. He states he's never had this before. He does relate he has a history of boils in the past however they normally drain on their own.  PCP Dr Sudie BaileyKnowlton   Past Medical History:  Diagnosis Date  . Chronic back pain   . Hypertension   . Myocardial infarction (HCC)     There are no active problems to display for this patient.   History reviewed. No pertinent surgical history.     Home Medications    Prior to Admission medications   Medication Sig Start Date End Date Taking? Authorizing Provider  acetaminophen (TYLENOL) 500 MG tablet Take 500 mg by mouth every 6 (six) hours as needed for mild pain.   Yes Historical Provider, MD  ibuprofen (ADVIL,MOTRIN) 200 MG tablet Take 200 mg by mouth every 6 (six) hours as needed for moderate pain.   Yes Historical Provider, MD  traMADol (ULTRAM) 50 MG tablet Take 1 tablet (50 mg total) by mouth every 6 (six) hours as needed. 07/23/15  Yes Bethann BerkshireJoseph Zammit, MD  HYDROcodone-acetaminophen (NORCO/VICODIN) 5-325 MG tablet Take 1 tablet by mouth every 6 (six) hours as needed. 09/01/15   Devoria AlbeIva Nasia Cannan, MD  naproxen (NAPROSYN) 250 MG tablet Take 1 po BID with food prn pain 09/01/15   Devoria AlbeIva Danise Dehne, MD  sulfamethoxazole-trimethoprim (BACTRIM DS,SEPTRA DS) 800-160 MG tablet Take 1 tablet by mouth 2 (two) times daily. 09/01/15   Devoria AlbeIva Navdeep Fessenden, MD    Family History No family history on file.  Social History Social History  Substance  Use Topics  . Smoking status: Current Every Day Smoker    Packs/day: 1.00    Types: Cigarettes  . Smokeless tobacco: Never Used  . Alcohol use Yes     Comment: 2x a week a 6 pack  Patient lives with his mother Patient is on disability for his back   Allergies   Bee venom and Morphine and related   Review of Systems Review of Systems  All other systems reviewed and are negative.    Physical Exam Updated Vital Signs BP 145/86 (BP Location: Left Arm)   Pulse 112   Temp 98 F (36.7 C) (Oral)   Resp 20   Ht 6' (1.829 m)   Wt 130 lb (59 kg)   SpO2 98%   BMI 17.63 kg/m   Vital signs normal    Physical Exam  Constitutional: He is oriented to person, place, and time.  Non-toxic appearance. He does not appear ill. No distress.  Underweight male  HENT:  Head: Normocephalic and atraumatic.  Right Ear: External ear normal.  Left Ear: External ear normal.  Nose: Nose normal. No mucosal edema or rhinorrhea.  Mouth/Throat: Mucous membranes are normal. No dental abscesses or uvula swelling.  Eyes: Conjunctivae and EOM are normal.  Neck: Normal range of motion and full passive range of motion without pain.  Cardiovascular: Normal rate.  Pulmonary/Chest: Effort normal. No respiratory distress. He has no rhonchi. He exhibits no crepitus.  Abdominal: Normal appearance. There is no rebound.  Genitourinary:  Genitourinary Comments: There was no redness or swelling in the pilonidal area, there is no obvious swelling or hemorrhoids around his rectum, however there is a 3 cm area of swelling of the inferior portion where the scrotum joins the groin just in front of the rectum and he is extremely painful to touch in that area.  Musculoskeletal: Normal range of motion. He exhibits no edema or tenderness.  Moves all extremities well.   Neurological: He is alert and oriented to person, place, and time. He has normal strength. No cranial nerve deficit.  Skin: Skin is warm, dry and intact.  No rash noted. No erythema. No pallor.  Psychiatric: He has a normal mood and affect. His speech is normal and behavior is normal. His mood appears not anxious.  Nursing note and vitals reviewed.    ED Treatments / Results  Labs (all labs ordered are listed, but only abnormal results are displayed) Labs Reviewed - No data to display  EKG  EKG Interpretation None       Radiology No results found.  Procedures Procedures (including critical care time)  Medications Ordered in ED Medications  sulfamethoxazole-trimethoprim (BACTRIM DS,SEPTRA DS) 800-160 MG per tablet 1 tablet (not administered)  bupivacaine (MARCAINE) 0.5 % injection 10 mL (10 mLs Infiltration Given 09/01/15 0556)  morphine 4 MG/ML injection 4 mg (4 mg Intramuscular Given 09/01/15 0556)  ondansetron (ZOFRAN-ODT) disintegrating tablet 4 mg (4 mg Oral Given 09/01/15 0555)     Initial Impression / Assessment and Plan / ED Course  I have reviewed the triage vital signs and the nursing notes.  Pertinent labs & imaging results that were available during my care of the patient were reviewed by me and considered in my medical decision making (see chart for details).  Clinical Course   Patient was given morphine IM, he has an allergy listed as nausea, so he was given Zofran. We discussed doing an I and D of the abscess in his scrotum and he is agreeable.  Pt states he is already using Dial bar soap.   INCISION AND DRAINAGE Performed by: Ward Givens Consent: Verbal consent obtained. Risks and benefits: risks, benefits and alternatives were discussed Type: abscess  Body area: scrotum   Anesthesia: local infiltration  Incision was made with a scalpel.  Local anesthetic: marcaine  Anesthetic total: 3 ml  Complexity: complex Blunt dissection to break up loculations  Drainage: purulent  Drainage amount: moderate/large  Packing material: 1/4 in iodoform gauze  Patient tolerance: Patient tolerated the  procedure well with no immediate complications. Chaperone was in room during procedure.     Review of the [place] database shows patient got #20 hydrocodone 5/325 on 07/26/2014 and #20 tramadol 50 mg tablets on 07/24/2015. These were both prescribed from our emergency department.  Final Clinical Impressions(s) / ED Diagnoses   Final diagnoses:  Scrotal abscess    New Prescriptions New Prescriptions   HYDROCODONE-ACETAMINOPHEN (NORCO/VICODIN) 5-325 MG TABLET    Take 1 tablet by mouth every 6 (six) hours as needed.   NAPROXEN (NAPROSYN) 250 MG TABLET    Take 1 po BID with food prn pain   SULFAMETHOXAZOLE-TRIMETHOPRIM (BACTRIM DS,SEPTRA DS) 800-160 MG TABLET    Take 1 tablet by mouth 2 (two) times daily.    Plan discharge  Devoria Albe, MD, Concha Pyo, MD  09/01/15 0625  

## 2015-09-01 NOTE — ED Notes (Signed)
MD at bedside. 

## 2015-09-01 NOTE — Discharge Instructions (Signed)
Take the antibiotic until gone. Take the hydrocodone for pain with the naproxen. Soak in a tub of warm water for 20-30 minutes at least 3 times a day. You can put Epson salts in the water if you wish. On Monday, August 14 pull the packing out of the wound while you are in the the tub. Return to the emergency department if you get fever, your pain and swelling get worse again.

## 2015-09-01 NOTE — ED Triage Notes (Signed)
Pt states he is having pain and pressure to his rectal area, states he has never been diagnosed with hemorrhoid problems but feels that's what it is.

## 2017-05-07 ENCOUNTER — Other Ambulatory Visit: Payer: Self-pay

## 2017-05-07 ENCOUNTER — Emergency Department (HOSPITAL_COMMUNITY)
Admission: EM | Admit: 2017-05-07 | Discharge: 2017-05-07 | Disposition: A | Discharge status: Home / Self Care | Payer: Medicaid Other | Attending: Emergency Medicine | Admitting: Emergency Medicine

## 2017-05-07 ENCOUNTER — Encounter (HOSPITAL_COMMUNITY): Payer: Self-pay | Admitting: *Deleted

## 2017-05-07 DIAGNOSIS — I10 Essential (primary) hypertension: Secondary | ICD-10-CM | POA: Diagnosis not present

## 2017-05-07 DIAGNOSIS — I252 Old myocardial infarction: Secondary | ICD-10-CM | POA: Insufficient documentation

## 2017-05-07 DIAGNOSIS — M545 Low back pain: Secondary | ICD-10-CM | POA: Diagnosis not present

## 2017-05-07 DIAGNOSIS — F17210 Nicotine dependence, cigarettes, uncomplicated: Secondary | ICD-10-CM | POA: Diagnosis not present

## 2017-05-07 DIAGNOSIS — G8929 Other chronic pain: Secondary | ICD-10-CM

## 2017-05-07 MED ORDER — METHOCARBAMOL 500 MG PO TABS: 500.0000 mg | ORAL_TABLET | Freq: Every evening | ORAL | 0 refills | Status: DC | PRN

## 2017-05-07 MED ORDER — ACETAMINOPHEN 500 MG PO TABS: 1000.0000 mg | ORAL_TABLET | Freq: Four times a day (QID) | ORAL | 0 refills | Status: AC | PRN

## 2017-05-07 MED ORDER — HYDROCODONE-ACETAMINOPHEN 5-325 MG PO TABS
1.0000 | ORAL_TABLET | Freq: Once | ORAL | Status: AC
  Administered 2017-05-07: 1 via ORAL
  Filled 2017-05-07: qty 1

## 2017-05-07 MED ORDER — MENTHOL (TOPICAL ANALGESIC) 4 % EX GEL: 1.0000 "application " | Freq: Two times a day (BID) | CUTANEOUS | 0 refills | Status: DC | PRN

## 2017-05-07 MED ORDER — LIDOCAINE 5 % EX PTCH
1.0000 | MEDICATED_PATCH | CUTANEOUS | Status: DC
  Administered 2017-05-07: 1 via TRANSDERMAL
  Filled 2017-05-07 (×2): qty 1

## 2017-05-07 NOTE — ED Provider Notes (Signed)
[facility] EMERGENCY DEPARTMENT Provider Note   CSN: [id number] Arrival date & time: 05/07/17  1025     History   Chief Complaint Chief Complaint  Patient presents with  . Back Pain    HPI Jerome Washington is a 47 y.o. male past medical history of hypertension, myocardial infarction, chronic back pain presenting with acute on chronic lumbar pain after exerting himself pushing a lawnmower and heavy lifting.  He reports a history of degenerative disc disease and has been experiencing intermittent symptoms for the past 4 years. He also reports bilateral numbness and weakness in his legs whenever he experience back pain. He has not taken anything for his symptoms today. Denies any fever, chills, history of IV drug use, new weakness or numbness of the lower extremities, loss of bowel or bladder function, saddle paresthesia, history of malignancy, night sweats or unexplained weight loss.  HPI  Past Medical History:  Diagnosis Date  . Chronic back pain   . Hypertension   . Myocardial infarction (HCC)     There are no active problems to display for this patient.   History reviewed. No pertinent surgical history.      Home Medications    Prior to Admission medications   Medication Sig Start Date End Date Taking? Authorizing Provider  acetaminophen (TYLENOL) 500 MG tablet Take 2 tablets (1,000 mg total) by mouth every 6 (six) hours as needed for up to 7 days for mild pain. 05/07/17 05/14/17  Georgiana Shore, PA-C  HYDROcodone-acetaminophen (NORCO/VICODIN) 5-325 MG tablet Take 1 tablet by mouth every 6 (six) hours as needed. 09/01/15   Devoria Albe, MD  ibuprofen (ADVIL,MOTRIN) 200 MG tablet Take 200 mg by mouth every 6 (six) hours as needed for moderate pain.    [provider]  Menthol, Topical Analgesic, (BIOFREEZE) 4 % GEL Apply 1 application topically 2 (two) times daily as needed. 05/07/17   Georgiana Shore, PA-C  methocarbamol (ROBAXIN) 500 MG tablet Take 1 tablet  (500 mg total) by mouth at bedtime as needed. 05/07/17   Mathews Robinsons B, PA-C  naproxen (NAPROSYN) 250 MG tablet Take 1 po BID with food prn pain 09/01/15   Devoria Albe, MD  sulfamethoxazole-trimethoprim (BACTRIM DS,SEPTRA DS) 800-160 MG tablet Take 1 tablet by mouth 2 (two) times daily. 09/01/15   Devoria Albe, MD  traMADol (ULTRAM) 50 MG tablet Take 1 tablet (50 mg total) by mouth every 6 (six) hours as needed. 07/23/15   Bethann Berkshire, MD    Family History History reviewed. No pertinent family history.  Social History Social History   Tobacco Use  . Smoking status: Current Every Day Smoker    Packs/day: 1.00    Types: Cigarettes  . Smokeless tobacco: Never Used  Substance Use Topics  . Alcohol use: Yes    Comment: 2x a week a 6 pack  . Drug use: No     Allergies   Bee venom and Morphine and related   Review of Systems Review of Systems  Constitutional: Negative for chills, diaphoresis, fatigue, fever and unexpected weight change.  Respiratory: Negative for shortness of breath and wheezing.   Cardiovascular: Negative for chest pain, palpitations and leg swelling.  Gastrointestinal: Negative for abdominal pain, nausea and vomiting.  Genitourinary: Negative for decreased urine volume, difficulty urinating, flank pain, frequency and hematuria.  Musculoskeletal: Positive for back pain and myalgias. Negative for neck pain and neck stiffness.  Skin: Negative for color change, pallor and rash.  Neurological: Positive for weakness  and numbness.       Numbing pain in his thighs bilaterally and generalized weakness of the lower extremities on and off for 4 years. No new symptoms today.     Physical Exam Updated Vital Signs BP (!) 146/96 (BP Location: Right Arm)   Pulse (!) 106   Temp 98.4 F (36.9 C) (Oral)   Resp 18   Ht 6' (1.829 m)   Wt 61.2 kg (135 lb)   SpO2 100%   BMI 18.31 kg/m   Physical Exam  Constitutional: He appears well-developed and well-nourished. No  distress.  Afebrile, nontoxic-appearing, in obvious discomfort, cachectic  HENT:  Head: Normocephalic and atraumatic.  Eyes: Conjunctivae are normal.  Neck: Neck supple.  Cardiovascular: Normal rate and regular rhythm.  No murmur heard. Pulmonary/Chest: Effort normal and breath sounds normal. No respiratory distress.  Abdominal: Soft. There is no tenderness.  Musculoskeletal: He exhibits tenderness. He exhibits no edema.  Tender to palpation of the lumbar spine, more so right musculature.   Neurological: He is alert. No sensory deficit.  Patient can ambulate without assistance but is hunched over and cannot fully extend the spine. Sensation intact bilaterally. 3/5 strength in hip flexion bilaterally, 4/5 strength in knee flexion and extension bilaterally.  Skin: Skin is warm and dry. No rash noted. He is not diaphoretic. No erythema. No pallor.  Psychiatric: He has a normal mood and affect.  Nursing note and vitals reviewed.    ED Treatments / Results  Labs (all labs ordered are listed, but only abnormal results are displayed) Labs Reviewed - No data to display  EKG None  Radiology No results found.  Procedures Procedures (including critical care time)  Medications Ordered in ED Medications  lidocaine (LIDODERM) 5 % 1 patch (1 patch Transdermal Patch Applied 05/07/17 1231)  HYDROcodone-acetaminophen (NORCO/VICODIN) 5-325 MG per tablet 1 tablet (1 tablet Oral Given 05/07/17 1212)     Initial Impression / Assessment and Plan / ED Course  I have reviewed the triage vital signs and the nursing notes.  Pertinent labs & imaging results that were available during my care of the patient were reviewed by me and considered in my medical decision making (see chart for details).    Patient with hx of degenerative disc disease presents with acute on chronic lower back pain after straining his back pushing a lawnmower.  No new gross neurological deficits and normal neuro exam.   Patient has no gait abnormality or concern for cauda equina.    TTP at lumbar spine and right lower back musculature, which he reports as chronic. Lower extremity weakness also reported as chronic, may be due to pain and poor effort as well. Patient is ambulatory without assistance but with reduced rom of the spine and walking with his spine in flexion. Reduced rom in extension of the spine  No loss of bowel or bladder control, fever, night sweats, weight loss, h/o malignancy, or IVDU.    RICE protocol and pain medications indicated and discussed with patient.    Patient has not been taking medications for his pain. He is wanting to leave immediately after he was given medications and lidocaine patch in ED. Patient was asked to allow a litle bit of time for the medication to take effect prior to leaving.  On reassessment, patient reported improvement and was ambulating in upright position. He is ready to leave and wants to go home. Discharge home with symptomatic relief and close follow-up with PCP.  Discussed strict return precautions  and advised to return to the emergency department if experiencing any new or worsening symptoms. Instructions were understood and patient agreed with discharge plan. Final Clinical Impressions(s) / ED Diagnoses   Final diagnoses:  Chronic midline low back pain without sciatica    ED Discharge Orders        Ordered    methocarbamol (ROBAXIN) 500 MG tablet  At bedtime PRN     05/07/17 1255    Menthol, Topical Analgesic, (BIOFREEZE) 4 % GEL  2 times daily PRN     05/07/17 1255    acetaminophen (TYLENOL) 500 MG tablet  Every 6 hours PRN     05/07/17 1255       Gregary Cromer 05/07/17 1258    Bethann Berkshire, MD 05/12/17 [phone]

## 2017-05-07 NOTE — Discharge Instructions (Signed)
As discussed, The medicine prescribed can help with muscle spasm but cannot be taken if driving, with alcohol or operating machinery.   Follow up with your Primary care provider if symptoms persist beyond a week.  Return if worsening or new concerning symptoms in the meantime.

## 2017-05-07 NOTE — ED Triage Notes (Signed)
Pt co worsening back pain

## 2017-05-07 NOTE — ED Notes (Signed)
ED Provider at bedside. 

## 2017-05-26 ENCOUNTER — Encounter (HOSPITAL_COMMUNITY): Payer: Self-pay | Admitting: Emergency Medicine

## 2017-05-26 ENCOUNTER — Emergency Department (HOSPITAL_COMMUNITY)
Admission: EM | Admit: 2017-05-26 | Discharge: 2017-05-26 | Disposition: A | Discharge status: Home / Self Care | Payer: Medicaid Other | Attending: Emergency Medicine | Admitting: Emergency Medicine

## 2017-05-26 ENCOUNTER — Other Ambulatory Visit: Payer: Self-pay

## 2017-05-26 ENCOUNTER — Emergency Department (HOSPITAL_COMMUNITY): Payer: Medicaid Other

## 2017-05-26 DIAGNOSIS — Y93H9 Activity, other involving exterior property and land maintenance, building and construction: Secondary | ICD-10-CM | POA: Diagnosis not present

## 2017-05-26 DIAGNOSIS — F17210 Nicotine dependence, cigarettes, uncomplicated: Secondary | ICD-10-CM | POA: Insufficient documentation

## 2017-05-26 DIAGNOSIS — M545 Low back pain, unspecified: Secondary | ICD-10-CM

## 2017-05-26 DIAGNOSIS — Y998 Other external cause status: Secondary | ICD-10-CM | POA: Insufficient documentation

## 2017-05-26 DIAGNOSIS — I10 Essential (primary) hypertension: Secondary | ICD-10-CM | POA: Insufficient documentation

## 2017-05-26 DIAGNOSIS — W228XXA Striking against or struck by other objects, initial encounter: Secondary | ICD-10-CM | POA: Insufficient documentation

## 2017-05-26 DIAGNOSIS — S3992XA Unspecified injury of lower back, initial encounter: Secondary | ICD-10-CM | POA: Diagnosis present

## 2017-05-26 DIAGNOSIS — Y92007 Garden or yard of unspecified non-institutional (private) residence as the place of occurrence of the external cause: Secondary | ICD-10-CM | POA: Insufficient documentation

## 2017-05-26 MED ORDER — IBUPROFEN 200 MG PO TABS: 200.0000 mg | ORAL_TABLET | Freq: Four times a day (QID) | ORAL | 0 refills | Status: DC | PRN

## 2017-05-26 MED ORDER — HYDROCODONE-ACETAMINOPHEN 5-325 MG PO TABS
1.0000 | ORAL_TABLET | Freq: Once | ORAL | Status: AC
  Administered 2017-05-26: 1 via ORAL
  Filled 2017-05-26: qty 1

## 2017-05-26 MED ORDER — MENTHOL (TOPICAL ANALGESIC) 4 % EX GEL: 1.0000 "application " | Freq: Two times a day (BID) | CUTANEOUS | 0 refills | Status: DC | PRN

## 2017-05-26 MED ORDER — METHOCARBAMOL 500 MG PO TABS: 500.0000 mg | ORAL_TABLET | Freq: Every evening | ORAL | 0 refills | Status: DC | PRN

## 2017-05-26 MED ORDER — METHOCARBAMOL 500 MG PO TABS
500.0000 mg | ORAL_TABLET | Freq: Once | ORAL | Status: AC
Start: 2017-05-26 — End: 2017-05-26
  Administered 2017-05-26: 500 mg via ORAL
  Filled 2017-05-26: qty 1

## 2017-05-26 MED ORDER — KETOROLAC TROMETHAMINE 60 MG/2ML IM SOLN
30.0000 mg | Freq: Once | INTRAMUSCULAR | Status: AC
  Administered 2017-05-26: 30 mg via INTRAMUSCULAR
  Filled 2017-05-26: qty 2
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
## 2017-07-05 ENCOUNTER — Other Ambulatory Visit: Payer: Self-pay

## 2017-07-05 ENCOUNTER — Encounter (HOSPITAL_COMMUNITY): Payer: Self-pay | Admitting: Emergency Medicine

## 2017-07-05 ENCOUNTER — Emergency Department (HOSPITAL_COMMUNITY)
Admission: EM | Admit: 2017-07-05 | Discharge: 2017-07-05 | Disposition: A | Discharge status: Home / Self Care | Payer: Medicaid Other | Attending: Emergency Medicine | Admitting: Emergency Medicine

## 2017-07-05 DIAGNOSIS — I10 Essential (primary) hypertension: Secondary | ICD-10-CM | POA: Diagnosis not present

## 2017-07-05 DIAGNOSIS — F17210 Nicotine dependence, cigarettes, uncomplicated: Secondary | ICD-10-CM | POA: Diagnosis not present

## 2017-07-05 DIAGNOSIS — H16133 Photokeratitis, bilateral: Secondary | ICD-10-CM | POA: Diagnosis not present

## 2017-07-05 DIAGNOSIS — Z79899 Other long term (current) drug therapy: Secondary | ICD-10-CM | POA: Diagnosis not present

## 2017-07-05 DIAGNOSIS — H5713 Ocular pain, bilateral: Secondary | ICD-10-CM | POA: Diagnosis present

## 2017-07-05 MED ORDER — IBUPROFEN 600 MG PO TABS: 600.0000 mg | ORAL_TABLET | Freq: Four times a day (QID) | ORAL | 0 refills | Status: DC | PRN

## 2017-07-05 MED ORDER — TETRACAINE HCL 0.5 % OP SOLN
2.0000 [drp] | Freq: Once | OPHTHALMIC | Status: AC
  Administered 2017-07-05: 2 [drp] via OPHTHALMIC
  Filled 2017-07-05: qty 4

## 2017-07-05 MED ORDER — FLUORESCEIN SODIUM 1 MG OP STRP
1.0000 | ORAL_STRIP | Freq: Once | OPHTHALMIC | Status: AC
  Administered 2017-07-05: 1 via OPHTHALMIC
  Filled 2017-07-05: qty 1

## 2017-07-05 MED ORDER — IBUPROFEN 800 MG PO TABS
800.0000 mg | ORAL_TABLET | Freq: Once | ORAL | Status: AC
  Administered 2017-07-05: 800 mg via ORAL
  Filled 2017-07-05: qty 1

## 2017-07-05 MED ORDER — POLYMYXIN B-TRIMETHOPRIM 10000-0.1 UNIT/ML-% OP SOLN
1.0000 [drp] | OPHTHALMIC | Status: DC
  Filled 2017-07-05: qty 10

## 2017-07-05 MED ORDER — POLYMYXIN B-TRIMETHOPRIM 10000-0.1 UNIT/ML-% OP SOLN: 1.0000 [drp] | Freq: Four times a day (QID) | OPHTHALMIC | 0 refills | Status: AC

## 2017-07-05 NOTE — ED Provider Notes (Signed)
[facility]   CSN: [id number] Arrival date & time: 07/05/17  1927     History   Chief Complaint Chief Complaint  Patient presents with  . Eye Pain    HPI Jerome Washington is a 47 y.o. male.  HPI   47 year old male presenting complaining of bilateral eye pain.  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  He went home sleep but when he woke up, he complained of burning sensation, severe, involving both eyes, and he was unable to see.  He has never expecting this before.  He does not wear any contact lens.  He is up-to-date with his immunization including tetanus.  He denies any specific injury.  Past Medical History:  Diagnosis Date  . Chronic back pain   . Hypertension   . Myocardial infarction (HCC)     There are no active problems to display for this patient.   History reviewed. No pertinent surgical history.      Home Medications    Prior to Admission medications   Medication Sig Start Date End Date Taking? Authorizing Provider  ibuprofen (ADVIL,MOTRIN) 200 MG tablet Take 1 tablet (200 mg total) by mouth every 6 (six) hours as needed for moderate pain. 05/26/17   Mesner, Barbara CowerJason, MD  Menthol, Topical Analgesic, (BIOFREEZE) 4 % GEL Apply 1 application topically 2 (two) times daily as needed. 05/26/17   Mesner, Barbara CowerJason, MD  methocarbamol (ROBAXIN) 500 MG tablet Take 1 tablet (500 mg total) by mouth at bedtime as needed. 05/26/17   Mesner, Barbara CowerJason, MD    Family History No family history on file.  Social History Social History   Tobacco Use  . Smoking status: Current Every Day Smoker    Packs/day: 1.00    Types: Cigarettes  . Smokeless tobacco: Never Used  Substance Use Topics  . Alcohol use: Yes    Comment: 2x a week a 6 pack  . Drug use: No     Allergies   Bee venom and Morphine and related   Review of Systems Review of  Systems  Constitutional: Negative for fever.  Eyes: Positive for photophobia, pain and redness. Negative for discharge and itching.  Neurological: Negative for headaches.     Physical Exam Updated Vital Signs BP 135/83   Pulse (!) 111   Temp 98.9 F (37.2 C)   Resp 18   Wt 56.7 kg (125 lb)   SpO2 96%   BMI 18.46 kg/m   Physical Exam  Constitutional: He appears well-developed and well-nourished. No distress.  HENT:  Head: Atraumatic.  Eyes: Pupils are equal, round, and reactive to light. EOM and lids are normal. Lids are everted and swept, no foreign bodies found. Right eye exhibits no chemosis, no discharge, no exudate and no hordeolum. No foreign body present in the right eye. Left eye exhibits no chemosis, no discharge, no exudate and no hordeolum. No foreign body present in the left eye. Right conjunctiva is injected. Right conjunctiva has no hemorrhage. Left conjunctiva is injected. Left conjunctiva has no hemorrhage. No scleral icterus.  Slit lamp exam:      The right eye shows no corneal abrasion, no corneal flare, no corneal ulcer, no foreign body, no hyphema, no hypopyon and no fluorescein uptake.       The left eye shows no corneal abrasion, no corneal flare, no corneal ulcer, no  foreign body, no hyphema, no hypopyon and no fluorescein uptake.  Neck: Neck supple.  Neurological: He is alert.  Skin: No rash noted.  Psychiatric: He has a normal mood and affect.  Nursing note and vitals reviewed.    ED Treatments / Results  Labs (all labs ordered are listed, but only abnormal results are displayed) Labs Reviewed - No data to display  EKG None  Radiology No results found.  Procedures Procedures (including critical care time)  Medications Ordered in ED Medications  tetracaine (PONTOCAINE) 0.5 % ophthalmic solution 2 drop (has no administration in time range)  fluorescein ophthalmic strip 1 strip (has no administration in time range)  ibuprofen (ADVIL,MOTRIN)  tablet 800 mg (has no administration in time range)     Initial Impression / Assessment and Plan / ED Course  I have reviewed the triage vital signs and the nursing notes.  Pertinent labs & imaging results that were available during my care of the patient were reviewed by me and considered in my medical decision making (see chart for details).     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  Symptom is consistent with welders arc keratitis.  Patient report moderate improvement after tetracaine eyedrops were applied to both eyes.  His vision did improved while he is in the ER.  Will provide ice pack, Polytrim eye drops, and anti-inflammatory medication as treatment.  Ophthalmology referral given as needed.   Fayrene Helper, PA-C 07/05/17 2029    Samuel Jester, DO 07/09/17 1645

## 2017-07-05 NOTE — ED Triage Notes (Signed)
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 Pt states he wore a welding shield.

## 2017-07-05 NOTE — Discharge Instructions (Addendum)
Apply 1-2 drops and antibiotic eyedrop to both eyes every 6 hrs while awake for 1 week.  Use ice pack for comfort and take ibuprofen as needed.

## 2017-07-05 NOTE — ED Notes (Signed)
Patient unable to open eyes to perform visual acuity test.

## 2017-09-11 ENCOUNTER — Other Ambulatory Visit: Payer: Self-pay

## 2017-09-11 ENCOUNTER — Emergency Department (HOSPITAL_COMMUNITY)
Admission: EM | Admit: 2017-09-11 | Discharge: 2017-09-11 | Disposition: A | Discharge status: Home / Self Care | Payer: Medicaid Other | Attending: Emergency Medicine | Admitting: Emergency Medicine

## 2017-09-11 ENCOUNTER — Encounter (HOSPITAL_COMMUNITY): Payer: Self-pay | Admitting: Emergency Medicine

## 2017-09-11 DIAGNOSIS — L723 Sebaceous cyst: Secondary | ICD-10-CM | POA: Diagnosis not present

## 2017-09-11 DIAGNOSIS — F17210 Nicotine dependence, cigarettes, uncomplicated: Secondary | ICD-10-CM | POA: Diagnosis not present

## 2017-09-11 DIAGNOSIS — L089 Local infection of the skin and subcutaneous tissue, unspecified: Secondary | ICD-10-CM | POA: Diagnosis not present

## 2017-09-11 DIAGNOSIS — I10 Essential (primary) hypertension: Secondary | ICD-10-CM | POA: Insufficient documentation

## 2017-09-11 DIAGNOSIS — I252 Old myocardial infarction: Secondary | ICD-10-CM | POA: Insufficient documentation

## 2017-09-11 MED ORDER — LIDOCAINE HCL (PF) 1 % IJ SOLN
INTRAMUSCULAR | Status: AC
  Filled 2017-09-11: qty 5

## 2017-09-11 MED ORDER — DOXYCYCLINE HYCLATE 100 MG PO CAPS: 100.0000 mg | ORAL_CAPSULE | Freq: Two times a day (BID) | ORAL | 0 refills | Status: DC

## 2017-09-11 MED ORDER — POVIDONE-IODINE 10 % EX SOLN
CUTANEOUS | Status: AC
  Filled 2017-09-11: qty 15

## 2017-09-11 NOTE — ED Provider Notes (Signed)
[facility] EMERGENCY DEPARTMENT Provider Note   CSN: [id number] Arrival date & time: 09/11/17  1148     History   Chief Complaint Chief Complaint  Patient presents with  . Abscess    HPI Jerome [facility] is a 47 y.o. male.  The history is provided by the patient. No language interpreter was used.  Abscess  Location:  Face Facial abscess location:  Face Size:  3 Abscess quality: fluctuance, painful, redness and warmth   Red streaking: no   Progression:  Worsening Pain details:    Quality:  Aching   Severity:  Moderate   Timing:  Constant   Progression:  Worsening Chronicity:  New Relieved by:  Nothing Worsened by:  Nothing Ineffective treatments:  None tried Pt has a large cyst by his left ear  Past Medical History:  Diagnosis Date  . Chronic back pain   . Hypertension   . Myocardial infarction (HCC)     There are no active problems to display for this patient.   History reviewed. No pertinent surgical history.      Home Medications    Prior to Admission medications   Medication Sig Start Date End Date Taking? Authorizing Provider  doxycycline (VIBRAMYCIN) 100 MG capsule Take 1 capsule (100 mg total) by mouth 2 (two) times daily. 09/11/17   Elson Areas, PA-C  ibuprofen (ADVIL,MOTRIN) 600 MG tablet Take 1 tablet (600 mg total) by mouth every 6 (six) hours as needed. 07/05/17   Fayrene Helper, PA-C  Menthol, Topical Analgesic, (BIOFREEZE) 4 % GEL Apply 1 application topically 2 (two) times daily as needed. 05/26/17   Mesner, Barbara Cower, MD  methocarbamol (ROBAXIN) 500 MG tablet Take 1 tablet (500 mg total) by mouth at bedtime as needed. 05/26/17   Mesner, Barbara Cower, MD    Family History History reviewed. No pertinent family history.  Social History Social History   Tobacco Use  . Smoking status: Current Every Day Smoker    Packs/day: 1.00    Types: Cigarettes  . Smokeless tobacco: Never Used  Substance Use Topics  . Alcohol use: Yes    Comment: 2x a week a  6 pack  . Drug use: No     Allergies   Bee venom and Morphine and related   Review of Systems Review of Systems  All other systems reviewed and are negative.    Physical Exam Updated Vital Signs BP 120/83 (BP Location: Right Arm)   Pulse (!) 103   Temp 98.4 F (36.9 C) (Oral)   Resp 16   Ht 5\' 11"  (1.803 m)   Wt 59 kg   SpO2 96%   BMI 18.13 kg/m   Physical Exam  Constitutional: He appears well-developed and well-nourished.  HENT:  Head: Normocephalic.  3cm swollen cyst  Neck: Normal range of motion.  Cardiovascular: Normal rate.  Pulmonary/Chest: Effort normal.  Musculoskeletal: Normal range of motion.  Skin: Skin is warm.  Nursing note and vitals reviewed.    ED Treatments / Results  Labs (all labs ordered are listed, but only abnormal results are displayed) Labs Reviewed - No data to display  EKG None  Radiology No results found.  Procedures .Marland KitchenIncision and Drainage Date/Time: 09/11/2017 3:59 PM Performed by: Elson Areas, PA-C Authorized by: Elson Areas, PA-C   Consent:    Consent obtained:  Verbal   Consent given by:  Patient   Risks discussed:  Bleeding, incomplete drainage, pain and damage to other organs   Alternatives discussed:  No treatment Universal protocol:    Procedure explained and questions answered to patient or proxy's satisfaction: yes     Relevant documents present and verified: yes     Test results available and properly labeled: yes     Imaging studies available: yes     Required blood products, implants, devices, and special equipment available: yes     Site/side marked: yes     Immediately prior to procedure a time out was called: yes     Patient identity confirmed:  Verbally with patient Location:    Type:  Cyst   Size:  3   Location:  Head Pre-procedure details:    Skin preparation:  Betadine Anesthesia (see MAR for exact dosages):    Anesthesia method:  Local infiltration   Local anesthetic:  Lidocaine 1%  WITH epi and lidocaine 1% w/o epi Procedure type:    Complexity:  Complex Procedure details:    Incision types:  Single straight   Incision depth:  Subcutaneous   Scalpel blade:  11   Wound management:  Probed and deloculated, irrigated with saline and extensive cleaning   Drainage:  Purulent   Drainage amount:  Copious Post-procedure details:    Patient tolerance of procedure:  Tolerated well, no immediate complications   (including critical care time)  Medications Ordered in ED Medications  povidone-iodine (BETADINE) 10 % external solution (has no administration in time range)  lidocaine (PF) (XYLOCAINE) 1 % injection (has no administration in time range)     Initial Impression / Assessment and Plan / ED Course  I have reviewed the triage vital signs and the nursing notes.  Pertinent labs & imaging results that were available during my care of the patient were reviewed by me and considered in my medical decision making (see chart for details).     Pt counseled on cyst.    Final Clinical Impressions(s) / ED Diagnoses   Final diagnoses:  Sebaceous cyst of ear    ED Discharge Orders         Ordered    doxycycline (VIBRAMYCIN) 100 MG capsule  2 times daily     09/11/17 1405        An After Visit Summary was printed and given to the patient.    Elson AreasSofia, Kyria Bumgardner K, PA-C 09/11/17 1559    Eber HongMiller, Brian, MD 09/12/17 [id number]

## 2017-09-11 NOTE — ED Triage Notes (Signed)
Patient complaining of abscess behind left ear x 2 days.

## 2017-09-11 NOTE — ED Notes (Signed)
Dry dressing applied

## 2018-01-06 ENCOUNTER — Emergency Department (HOSPITAL_COMMUNITY)
Admission: EM | Admit: 2018-01-06 | Discharge: 2018-01-06 | Disposition: A | Discharge status: Home / Self Care | Payer: Medicaid Other | Attending: Emergency Medicine | Admitting: Emergency Medicine

## 2018-01-06 ENCOUNTER — Emergency Department (HOSPITAL_COMMUNITY): Payer: Medicaid Other

## 2018-01-06 ENCOUNTER — Encounter (HOSPITAL_COMMUNITY): Payer: Self-pay

## 2018-01-06 ENCOUNTER — Other Ambulatory Visit: Payer: Self-pay

## 2018-01-06 DIAGNOSIS — I10 Essential (primary) hypertension: Secondary | ICD-10-CM | POA: Diagnosis not present

## 2018-01-06 DIAGNOSIS — Y999 Unspecified external cause status: Secondary | ICD-10-CM | POA: Insufficient documentation

## 2018-01-06 DIAGNOSIS — Y929 Unspecified place or not applicable: Secondary | ICD-10-CM | POA: Insufficient documentation

## 2018-01-06 DIAGNOSIS — S022XXA Fracture of nasal bones, initial encounter for closed fracture: Secondary | ICD-10-CM | POA: Diagnosis not present

## 2018-01-06 DIAGNOSIS — Z79899 Other long term (current) drug therapy: Secondary | ICD-10-CM | POA: Diagnosis not present

## 2018-01-06 DIAGNOSIS — Y939 Activity, unspecified: Secondary | ICD-10-CM | POA: Insufficient documentation

## 2018-01-06 DIAGNOSIS — W228XXA Striking against or struck by other objects, initial encounter: Secondary | ICD-10-CM | POA: Insufficient documentation

## 2018-01-06 DIAGNOSIS — F17210 Nicotine dependence, cigarettes, uncomplicated: Secondary | ICD-10-CM | POA: Insufficient documentation

## 2018-01-06 DIAGNOSIS — S0990XA Unspecified injury of head, initial encounter: Secondary | ICD-10-CM | POA: Diagnosis present
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
## 2018-01-06 NOTE — ED Triage Notes (Signed)
Pt was unloading a 6in x 6in x 488ft wooden beam and it accidentally hit patient in the face.  Witnesses say pt passed out for approx 10min.   Pt says can't breathe out of left nostril and bas blood on nose and under left eye.  Reports vision in left eye is blurry.  Pt also c/o neck pain and nose pain.

## 2018-01-06 NOTE — ED Notes (Signed)
Pt seen leaving department. Pt leaving AMA.

## 2018-01-06 NOTE — ED Notes (Signed)
Patient transported to CT 

## 2018-01-21 ENCOUNTER — Encounter (HOSPITAL_COMMUNITY): Payer: Self-pay | Admitting: Emergency Medicine

## 2018-01-21 ENCOUNTER — Emergency Department (HOSPITAL_COMMUNITY)
Admission: EM | Admit: 2018-01-21 | Discharge: 2018-01-21 | Disposition: A | Discharge status: Home / Self Care | Payer: Medicaid Other | Attending: Emergency Medicine | Admitting: Emergency Medicine

## 2018-01-21 ENCOUNTER — Other Ambulatory Visit: Payer: Self-pay

## 2018-01-21 DIAGNOSIS — F17210 Nicotine dependence, cigarettes, uncomplicated: Secondary | ICD-10-CM | POA: Insufficient documentation

## 2018-01-21 DIAGNOSIS — F10920 Alcohol use, unspecified with intoxication, uncomplicated: Secondary | ICD-10-CM

## 2018-01-21 DIAGNOSIS — I10 Essential (primary) hypertension: Secondary | ICD-10-CM | POA: Diagnosis not present

## 2018-01-21 DIAGNOSIS — Z79899 Other long term (current) drug therapy: Secondary | ICD-10-CM | POA: Insufficient documentation

## 2018-01-21 DIAGNOSIS — R51 Headache: Secondary | ICD-10-CM | POA: Diagnosis not present

## 2018-01-21 DIAGNOSIS — F10120 Alcohol abuse with intoxication, uncomplicated: Secondary | ICD-10-CM | POA: Diagnosis not present

## 2018-01-21 MED ORDER — CHLORDIAZEPOXIDE HCL 25 MG PO CAPS: ORAL_CAPSULE | ORAL | 0 refills | Status: DC
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
## 2018-01-21 NOTE — ED Provider Notes (Signed)
[facility] EMERGENCY DEPARTMENT Provider Note   CSN: [id number] Arrival date & time: 01/21/18  1619     History   Chief Complaint Chief Complaint  Patient presents with  . Alcohol Intoxication    HPI Jerome Washington is a 48 y.o. male.   Alcohol Intoxication  This is a recurrent problem. The current episode started 1 to 2 hours ago. The problem occurs constantly. The problem has been gradually worsening. Associated symptoms include headaches (intermittent). Pertinent negatives include no chest pain. Nothing aggravates the symptoms. Nothing relieves the symptoms. He has tried nothing for the symptoms.    Past Medical History:  Diagnosis Date  . Chronic back pain   . Hypertension   . Myocardial infarction (HCC)     There are no active problems to display for this patient.   History reviewed. No pertinent surgical history.      Home Medications    Prior to Admission medications   Medication Sig Start Date End Date Taking? Authorizing Provider  chlordiazePOXIDE (LIBRIUM) 25 MG capsule 50mg  PO TID x 1D, then 25-50mg  PO BID X 1D, then 25-50mg  PO QD X 1D 01/21/18   Antionetta Ator, Barbara Cower, MD  doxycycline (VIBRAMYCIN) 100 MG capsule Take 1 capsule (100 mg total) by mouth 2 (two) times daily. 09/11/17   Elson Areas, PA-C  ibuprofen (ADVIL,MOTRIN) 600 MG tablet Take 1 tablet (600 mg total) by mouth every 6 (six) hours as needed. 07/05/17   Fayrene Helper, PA-C  Menthol, Topical Analgesic, (BIOFREEZE) 4 % GEL Apply 1 application topically 2 (two) times daily as needed. 05/26/17   Nadege Carriger, Barbara Cower, MD  methocarbamol (ROBAXIN) 500 MG tablet Take 1 tablet (500 mg total) by mouth at bedtime as needed. 05/26/17   Magdalena Skilton, Barbara Cower, MD    Family History History reviewed. No pertinent family history.  Social History Social History   Tobacco Use  . Smoking status: Current Every Day Smoker    Packs/day: 1.00    Types: Cigarettes  . Smokeless tobacco: Never Used  Substance Use Topics  . Alcohol  use: Yes    Comment: 2x a week a 6 pack  . Drug use: No     Allergies   Bee venom; Morphine and related; and Penicillins   Review of Systems Review of Systems  Cardiovascular: Negative for chest pain.  Neurological: Positive for headaches (intermittent).  All other systems reviewed and are negative.    Physical Exam Updated Vital Signs BP 134/90 (BP Location: Left Arm)   Pulse 95   Temp 98.3 F (36.8 C) (Oral)   Resp 12   Ht 6' (1.829 m)   Wt 63.5 kg   SpO2 97%   BMI 18.99 kg/m   Physical Exam Vitals signs and nursing note reviewed.  Constitutional:      Appearance: He is well-developed.  HENT:     Head: Normocephalic and atraumatic.     Mouth/Throat:     Mouth: Mucous membranes are dry.     Pharynx: Oropharynx is clear.  Eyes:     Extraocular Movements: Extraocular movements intact.     Conjunctiva/sclera: Conjunctivae normal.     Pupils: Pupils are equal, round, and reactive to light.  Neck:     Musculoskeletal: Normal range of motion.  Cardiovascular:     Rate and Rhythm: Normal rate.  Pulmonary:     Effort: Pulmonary effort is normal. No respiratory distress.  Abdominal:     General: There is no distension.  Musculoskeletal: Normal range of motion.  General: No swelling or tenderness.  Skin:    General: Skin is warm and dry.  Neurological:     General: No focal deficit present.     Mental Status: He is alert.      ED Treatments / Results  Labs (all labs ordered are listed, but only abnormal results are displayed) Labs Reviewed - No data to display  EKG None  Radiology No results found.  Procedures Procedures (including critical care time)  Medications Ordered in ED Medications - No data to display   Initial Impression / Assessment and Plan / ED Course  I have reviewed the triage vital signs and the nursing notes.  Pertinent labs & imaging results that were available during my care of the patient were reviewed by me and  considered in my medical decision making (see chart for details).     Alcohol intoxication. Also passed out but woke easily. Here says he has intermittent headaches.  Girlfriend states he increased drinking recently since father died. He is improving greatly after a nap. No symptoms. Wants to quit drinking, librium taper. Ambulates. Urinates. Eats. Stable for discharge with wife.  Final Clinical Impressions(s) / ED Diagnoses   Final diagnoses:  Alcoholic intoxication without complication [facility])    ED Discharge Orders         Ordered    chlordiazePOXIDE (LIBRIUM) 25 MG capsule     01/21/18 1833           Zyad Boomer, Barbara Cower, MD 01/21/18 1844

## 2018-08-09 ENCOUNTER — Emergency Department (HOSPITAL_COMMUNITY)
Admission: EM | Admit: 2018-08-09 | Discharge: 2018-08-09 | Disposition: A | Discharge status: Home / Self Care | Payer: Medicaid Other | Attending: Emergency Medicine | Admitting: Emergency Medicine

## 2018-08-09 ENCOUNTER — Other Ambulatory Visit: Payer: Self-pay

## 2018-08-09 ENCOUNTER — Encounter (HOSPITAL_COMMUNITY): Payer: Self-pay

## 2018-08-09 DIAGNOSIS — K0889 Other specified disorders of teeth and supporting structures: Secondary | ICD-10-CM | POA: Diagnosis present

## 2018-08-09 DIAGNOSIS — K047 Periapical abscess without sinus: Secondary | ICD-10-CM

## 2018-08-09 DIAGNOSIS — I252 Old myocardial infarction: Secondary | ICD-10-CM | POA: Diagnosis not present

## 2018-08-09 DIAGNOSIS — F17210 Nicotine dependence, cigarettes, uncomplicated: Secondary | ICD-10-CM | POA: Diagnosis not present

## 2018-08-09 DIAGNOSIS — I10 Essential (primary) hypertension: Secondary | ICD-10-CM | POA: Insufficient documentation

## 2018-08-09 MED ORDER — CLINDAMYCIN HCL 300 MG PO CAPS: 300.0000 mg | ORAL_CAPSULE | Freq: Three times a day (TID) | ORAL | 0 refills | Status: AC

## 2018-08-09 MED ORDER — HYDROCODONE-ACETAMINOPHEN 5-325 MG PO TABS: 1.0000 | ORAL_TABLET | ORAL | 0 refills | Status: DC | PRN

## 2018-08-09 NOTE — ED Provider Notes (Signed)
[facility] EMERGENCY DEPARTMENT Provider Note   CSN: [id number] Arrival date & time: 08/09/18  1648     History   Chief Complaint Chief Complaint  Patient presents with  . Dental Pain    HPI Jerome Washington is a 48 y.o. male.     The history is provided by the patient. No language interpreter was used.  Dental Pain Location:  Lower Quality:  Aching Severity:  Mild Onset quality:  Gradual Timing:  Constant Progression:  Worsening Chronicity:  Recurrent Relieved by:  Nothing Worsened by:  Nothing Associated symptoms: facial pain, facial swelling and gum swelling     Past Medical History:  Diagnosis Date  . Chronic back pain   . Hypertension   . Myocardial infarction (HCC)     There are no active problems to display for this patient.   History reviewed. No pertinent surgical history.      Home Medications    Prior to Admission medications   Medication Sig Start Date End Date Taking? Authorizing Provider  chlordiazePOXIDE (LIBRIUM) 25 MG capsule 50mg  PO TID x 1D, then 25-50mg  PO BID X 1D, then 25-50mg  PO QD X 1D 01/21/18   Mesner, Barbara CowerJason, MD  clindamycin (CLEOCIN) 300 MG capsule Take 1 capsule (300 mg total) by mouth 3 (three) times daily for 10 days. 08/09/18 08/19/18  Elson AreasSofia, Leslie K, PA-C  doxycycline (VIBRAMYCIN) 100 MG capsule Take 1 capsule (100 mg total) by mouth 2 (two) times daily. 09/11/17   Elson AreasSofia, Leslie K, PA-C  HYDROcodone-acetaminophen (NORCO/VICODIN) 5-325 MG tablet Take 1 tablet by mouth every 4 (four) hours as needed. 08/09/18   Elson AreasSofia, Leslie K, PA-C  ibuprofen (ADVIL,MOTRIN) 600 MG tablet Take 1 tablet (600 mg total) by mouth every 6 (six) hours as needed. 07/05/17   Fayrene Helperran, [place], PA-C  Menthol, Topical Analgesic, (BIOFREEZE) 4 % GEL Apply 1 application topically 2 (two) times daily as needed. 05/26/17   Mesner, Barbara CowerJason, MD  methocarbamol (ROBAXIN) 500 MG tablet Take 1 tablet (500 mg total) by mouth at bedtime as needed. 05/26/17   Mesner, Barbara CowerJason, MD     Family History History reviewed. No pertinent family history.  Social History Social History   Tobacco Use  . Smoking status: Current Every Day Smoker    Packs/day: 1.00    Types: Cigarettes  . Smokeless tobacco: Never Used  Substance Use Topics  . Alcohol use: Yes    Comment: 2x a week a 6 pack  . Drug use: No     Allergies   Bee venom, Morphine and related, and Penicillins   Review of Systems Review of Systems  HENT: Positive for facial swelling.      Physical Exam Updated Vital Signs BP (!) 147/97 (BP Location: Right Arm)   Pulse (!) 120   Temp 98.6 F (37 C) (Oral)   Resp 18   Ht 6' (1.829 m)   Wt 63.5 kg   SpO2 100%   BMI 18.99 kg/m   Physical Exam Vitals signs and nursing note reviewed.  HENT:     Head: Normocephalic.     Right Ear: Tympanic membrane normal.     Nose: Nose normal.     Mouth/Throat:     Comments: Severe dental decay, swollen right lower gum,   Eyes:     Pupils: Pupils are equal, round, and reactive to light.  Neck:     Musculoskeletal: Normal range of motion.  Cardiovascular:     Rate and Rhythm: Normal rate.  Musculoskeletal:  Normal range of motion.  Skin:    General: Skin is warm.  Neurological:     General: No focal deficit present.     Mental Status: He is alert.  Psychiatric:        Mood and Affect: Mood normal.      ED Treatments / Results  Labs (all labs ordered are listed, but only abnormal results are displayed) Labs Reviewed - No data to display  EKG None  Radiology No results found.  Procedures Procedures (including critical care time)  Medications Ordered in ED Medications - No data to display   Initial Impression / Assessment and Plan / ED Course  I have reviewed the triage vital signs and the nursing notes.  Pertinent labs & imaging results that were available during my care of the patient were reviewed by me and considered in my medical decision making (see chart for details).       Pt  advised of need to schedule dental care.  Pt plans to get all teeth extracted when he can.  Pt given Rx for clindamycin and hydrocodone   Final Clinical Impressions(s) / ED Diagnoses   Final diagnoses:  Dental abscess    ED Discharge Orders         Ordered    clindamycin (CLEOCIN) 300 MG capsule  3 times daily     08/09/18 1822    HYDROcodone-acetaminophen (NORCO/VICODIN) 5-325 MG tablet  Every 4 hours PRN     08/09/18 1822        An After Visit Summary was printed and given to the patient.    Sidney Ace 08/09/18 1906    Nat Christen, MD 08/10/18 [phone]

## 2018-08-09 NOTE — ED Triage Notes (Signed)
Bottom right side dental pain lasting 1x week that radiates up jaw to ear

## 2019-05-31 DIAGNOSIS — C049 Malignant neoplasm of floor of mouth, unspecified: Secondary | ICD-10-CM | POA: Insufficient documentation

## 2019-05-31 DIAGNOSIS — Z720 Tobacco use: Secondary | ICD-10-CM | POA: Insufficient documentation

## 2019-05-31 DIAGNOSIS — I252 Old myocardial infarction: Secondary | ICD-10-CM | POA: Insufficient documentation

## 2021-03-05 ENCOUNTER — Other Ambulatory Visit (HOSPITAL_COMMUNITY): Payer: Self-pay | Admitting: Internal Medicine

## 2021-03-05 ENCOUNTER — Ambulatory Visit (HOSPITAL_COMMUNITY)
Admission: RE | Admit: 2021-03-05 | Discharge: 2021-03-05 | Disposition: A | Discharge status: Home / Self Care | Payer: Medicaid Other | Source: Ambulatory Visit | Attending: Internal Medicine | Admitting: Internal Medicine

## 2021-03-05 ENCOUNTER — Other Ambulatory Visit: Payer: Self-pay

## 2021-03-05 DIAGNOSIS — M545 Low back pain, unspecified: Secondary | ICD-10-CM | POA: Insufficient documentation

## 2021-03-05 DIAGNOSIS — M79642 Pain in left hand: Secondary | ICD-10-CM

## 2021-03-05 DIAGNOSIS — M79641 Pain in right hand: Secondary | ICD-10-CM

## 2021-03-12 ENCOUNTER — Encounter: Payer: Self-pay | Admitting: *Deleted

## 2021-05-13 ENCOUNTER — Ambulatory Visit: Payer: Medicaid Other

## 2021-06-03 DIAGNOSIS — F17210 Nicotine dependence, cigarettes, uncomplicated: Secondary | ICD-10-CM | POA: Insufficient documentation

## 2021-06-03 DIAGNOSIS — G4700 Insomnia, unspecified: Secondary | ICD-10-CM | POA: Insufficient documentation

## 2021-06-03 DIAGNOSIS — K219 Gastro-esophageal reflux disease without esophagitis: Secondary | ICD-10-CM | POA: Insufficient documentation

## 2021-06-03 DIAGNOSIS — G629 Polyneuropathy, unspecified: Secondary | ICD-10-CM | POA: Insufficient documentation

## 2021-06-03 DIAGNOSIS — M8580 Other specified disorders of bone density and structure, unspecified site: Secondary | ICD-10-CM | POA: Insufficient documentation

## 2021-06-03 DIAGNOSIS — M51369 Other intervertebral disc degeneration, lumbar region without mention of lumbar back pain or lower extremity pain: Secondary | ICD-10-CM | POA: Insufficient documentation

## 2021-06-03 DIAGNOSIS — M5440 Lumbago with sciatica, unspecified side: Secondary | ICD-10-CM | POA: Insufficient documentation

## 2021-06-03 DIAGNOSIS — I700 Atherosclerosis of aorta: Secondary | ICD-10-CM | POA: Insufficient documentation

## 2021-06-03 DIAGNOSIS — I10 Essential (primary) hypertension: Secondary | ICD-10-CM | POA: Insufficient documentation

## 2021-06-03 DIAGNOSIS — M4310 Spondylolisthesis, site unspecified: Secondary | ICD-10-CM | POA: Insufficient documentation

## 2021-06-12 ENCOUNTER — Other Ambulatory Visit: Payer: Self-pay | Admitting: Family Medicine

## 2021-06-12 ENCOUNTER — Other Ambulatory Visit (HOSPITAL_COMMUNITY): Payer: Self-pay | Admitting: Family Medicine

## 2021-06-12 DIAGNOSIS — M4310 Spondylolisthesis, site unspecified: Secondary | ICD-10-CM

## 2021-06-12 DIAGNOSIS — M5442 Lumbago with sciatica, left side: Secondary | ICD-10-CM

## 2021-06-12 DIAGNOSIS — M419 Scoliosis, unspecified: Secondary | ICD-10-CM

## 2021-06-12 DIAGNOSIS — M5136 Other intervertebral disc degeneration, lumbar region: Secondary | ICD-10-CM

## 2021-06-12 DIAGNOSIS — M5441 Lumbago with sciatica, right side: Secondary | ICD-10-CM

## 2021-07-04 ENCOUNTER — Ambulatory Visit (HOSPITAL_COMMUNITY): Admission: RE | Admit: 2021-07-04 | Payer: Medicaid Other | Source: Ambulatory Visit

## 2021-07-04 ENCOUNTER — Encounter (HOSPITAL_COMMUNITY): Payer: Self-pay

## 2021-07-04 ENCOUNTER — Ambulatory Visit (HOSPITAL_COMMUNITY): Payer: Medicaid Other

## 2021-07-08 DIAGNOSIS — R6889 Other general symptoms and signs: Secondary | ICD-10-CM | POA: Insufficient documentation

## 2021-07-18 ENCOUNTER — Encounter: Payer: Self-pay | Admitting: *Deleted

## 2021-07-21 ENCOUNTER — Encounter (HOSPITAL_COMMUNITY): Payer: Self-pay | Admitting: Emergency Medicine

## 2021-07-21 ENCOUNTER — Other Ambulatory Visit: Payer: Self-pay

## 2021-07-21 ENCOUNTER — Emergency Department (HOSPITAL_COMMUNITY): Payer: Medicaid Other

## 2021-07-21 ENCOUNTER — Emergency Department (HOSPITAL_COMMUNITY)
Admission: EM | Admit: 2021-07-21 | Discharge: 2021-07-22 | Disposition: A | Discharge status: Home / Self Care | Payer: Medicaid Other | Attending: Emergency Medicine | Admitting: Emergency Medicine

## 2021-07-21 DIAGNOSIS — R072 Precordial pain: Secondary | ICD-10-CM

## 2021-07-21 DIAGNOSIS — I2510 Atherosclerotic heart disease of native coronary artery without angina pectoris: Secondary | ICD-10-CM | POA: Insufficient documentation

## 2021-07-21 DIAGNOSIS — R61 Generalized hyperhidrosis: Secondary | ICD-10-CM | POA: Insufficient documentation

## 2021-07-21 DIAGNOSIS — R0602 Shortness of breath: Secondary | ICD-10-CM | POA: Insufficient documentation

## 2021-07-21 DIAGNOSIS — G4489 Other headache syndrome: Secondary | ICD-10-CM

## 2021-07-21 DIAGNOSIS — F17200 Nicotine dependence, unspecified, uncomplicated: Secondary | ICD-10-CM | POA: Insufficient documentation

## 2021-07-21 DIAGNOSIS — I10 Essential (primary) hypertension: Secondary | ICD-10-CM | POA: Insufficient documentation

## 2021-07-21 LAB — CBC
HCT: 43.3 % (ref 39.0–52.0)
Hemoglobin: 14.9 g/dL (ref 13.0–17.0)
MCH: 33.7 pg (ref 26.0–34.0)
MCHC: 34.4 g/dL (ref 30.0–36.0)
MCV: 98 fL (ref 80.0–100.0)
Platelets: 206 10*3/uL (ref 150–400)
RBC: 4.42 MIL/uL (ref 4.22–5.81)
RDW: 13.4 % (ref 11.5–15.5)
WBC: 4.9 10*3/uL (ref 4.0–10.5)
nRBC: 0 % (ref 0.0–0.2)

## 2021-07-21 LAB — BASIC METABOLIC PANEL
Anion gap: 9 (ref 5–15)
BUN: 6 mg/dL (ref 6–20)
CO2: 26 mmol/L (ref 22–32)
Calcium: 8.6 mg/dL — ABNORMAL LOW (ref 8.9–10.3)
Chloride: 102 mmol/L (ref 98–111)
Creatinine, Ser: 0.73 mg/dL (ref 0.61–1.24)
GFR, Estimated: >60 mL/min (ref >60)
Glucose, Bld: 94 mg/dL (ref 70–99)
Potassium: 4.1 mmol/L (ref 3.5–5.1)
Sodium: 137 mmol/L (ref 135–145)

## 2021-07-21 LAB — TROPONIN I (HIGH SENSITIVITY): Troponin I (High Sensitivity): 3 ng/L (ref <18)

## 2021-07-21 MED ORDER — NITROGLYCERIN 0.4 MG SL SUBL
0.4000 mg | SUBLINGUAL_TABLET | SUBLINGUAL | Status: DC | PRN
  Administered 2021-07-21 (×2): 0.4 mg via SUBLINGUAL
  Filled 2021-07-21: qty 1

## 2021-07-21 NOTE — ED Triage Notes (Signed)
Pt with c/o chest pain x 2 hrs. Took 324 mg ASA before arrival.

## 2021-07-21 NOTE — ED Provider Notes (Signed)
[facility] EMERGENCY DEPARTMENT Provider Note   CSN: [id number] Arrival date & time: 07/21/21  2159     History  Chief Complaint  Patient presents with   Chest Pain    Jerome Washington is a 51 y.o. male.  The history is provided by the patient.  Chest Pain Pain location:  Substernal area Pain quality: pressure   Pain radiates to:  Does not radiate Pain severity:  Moderate Onset quality:  Gradual Timing:  Constant Progression:  Worsening Chronicity:  New Relieved by:  Nothing Worsened by:  Nothing Ineffective treatments:  Aspirin Associated symptoms: diaphoresis and shortness of breath   Risk factors: coronary artery disease and smoking   Patient with history of hypertension, CAD presents with chest pain.  He reports about 5 hours ago he began having chest pain and pressure at rest.  He reports associated shortness of breath and diaphoresis.  He does not recall having this pain recently.  Patient reports he is a smoker and drinks alcohol frequently.    Past Medical History:  Diagnosis Date   Chronic back pain    Hypertension    Myocardial infarction [facility])     Home Medications Prior to Admission medications   Medication Sig Start Date End Date Taking? Authorizing Provider  chlordiazePOXIDE (LIBRIUM) 25 MG capsule 50mg  PO TID x 1D, then 25-50mg  PO BID X 1D, then 25-50mg  PO QD X 1D 01/21/18   Mesner, 03/22/18, MD  doxycycline (VIBRAMYCIN) 100 MG capsule Take 1 capsule (100 mg total) by mouth 2 (two) times daily. 09/11/17   09/13/17, PA-C  HYDROcodone-acetaminophen (NORCO/VICODIN) 5-325 MG tablet Take 1 tablet by mouth every 4 (four) hours as needed. 08/09/18   08/11/18, PA-C  ibuprofen (ADVIL,MOTRIN) 600 MG tablet Take 1 tablet (600 mg total) by mouth every 6 (six) hours as needed. 07/05/17   07/07/17, PA-C  Menthol, Topical Analgesic, (BIOFREEZE) 4 % GEL Apply 1 application topically 2 (two) times daily as needed. 05/26/17   Mesner, 07/26/17, MD  methocarbamol  (ROBAXIN) 500 MG tablet Take 1 tablet (500 mg total) by mouth at bedtime as needed. 05/26/17   Mesner, 07/26/17, MD      Allergies    Bee venom, Morphine and related, and Penicillins    Review of Systems   Review of Systems  Constitutional:  Positive for diaphoresis.  Respiratory:  Positive for shortness of breath.   Cardiovascular:  Positive for chest pain.    Physical Exam Updated Vital Signs BP (!) 148/102   Pulse 63   Temp 98.3 F (36.8 C) (Oral)   Resp 15   Ht 1.829 m (6')   Wt 56.7 kg   SpO2 98%   BMI 16.95 kg/m  Physical Exam CONSTITUTIONAL: Disheveled, cachectic, appears older than stated age HEAD: Normocephalic/atraumatic EYES: EOMI/PERRL ENMT: Mucous membranes moist NECK: supple no meningeal signs SPINE/BACK:entire spine nontender CV: S1/S2 noted, no murmurs/rubs/gallops noted LUNGS: Lungs are clear to auscultation bilaterally, no apparent distress Chest-no tenderness ABDOMEN: soft, nontender, no rebound or guarding, bowel sounds noted throughout abdomen GU:no cva tenderness NEURO: Pt is awake/alert/appropriate, moves all extremitiesx4.  No facial droop.   EXTREMITIES: pulses normal/equal, full ROM, no calf tenderness, no edema SKIN: warm, color normal PSYCH: no abnormalities of mood noted, alert and oriented to situation  ED Results / Procedures / Treatments   Labs (all labs ordered are listed, but only abnormal results are displayed) Labs Reviewed  BASIC METABOLIC PANEL - Abnormal; Notable for the following components:  Result Value   Calcium 8.6 (*)    All other components within normal limits  CBC  TROPONIN I (HIGH SENSITIVITY)  TROPONIN I (HIGH SENSITIVITY)    EKG EKG Interpretation  Date/Time:  Monday July 22 2021 01:30:47 EDT Ventricular Rate:  65 PR Interval:  164 QRS Duration: 75 QT Interval:  386 QTC Calculation: 402 R Axis:   67 Text Interpretation: Sinus rhythm Probable left atrial enlargement Anteroseptal infarct, old Baseline  wander in lead(s) V5 No significant change since last tracing Confirmed by Zadie Rhine ([id number]) on 07/22/2021 1:46:05 AM Prehospital EKG reviewed, no STEMI  Radiology DG Chest Portable 1 View  Result Date: 07/21/2021 CLINICAL DATA:  Chest pain for 2 hours EXAM: PORTABLE CHEST 1 VIEW COMPARISON:  06/10/2011 FINDINGS: Check shadow is within normal limits. Lungs are hyperinflated. No focal infiltrate or effusion is seen. No bony abnormality is noted. IMPRESSION: Hyperinflated lungs.  No acute abnormality noted. Electronically Signed   By: Alcide Clever M.D.   On: 07/21/2021 22:57    Procedures Procedures    Medications Ordered in ED Medications  nitroGLYCERIN (NITROSTAT) SL tablet 0.4 mg (0.4 mg Sublingual Given 07/21/21 2321)  prochlorperazine (COMPAZINE) injection 10 mg (10 mg Intravenous Given 07/22/21 0159)    ED Course/ Medical Decision Making/ A&P Clinical Course as of 07/22/21 0324  Mon Jul 22, 2021  0050 Patient reports chest pain is improving, troponin pending [DW]  0230 Patient reports chest pain is improved.  Repeat troponin is negative.  Patient now reports he is having a headache that is typical of his previous headaches.  He is awake and alert and no focal neurodeficits.  Will treat headache and reassess [DW]  0324 Patient improved and reports all of his symptoms have resolved.  He will be discharged home [DW]    Clinical Course User Index [DW] Zadie Rhine, MD           HEART Score: 4                Medical Decision Making Amount and/or Complexity of Data Reviewed Labs: ordered. Radiology: ordered. ECG/medicine tests: ordered.  Risk Prescription drug management.   This patient presents to the ED for concern of chest pain, this involves an extensive number of treatment options, and is a complaint that carries with it a high risk of complications and morbidity.  The differential diagnosis includes but is not limited to acute coronary syndrome, aortic dissection,  pulmonary embolism, pericarditis, pneumothorax, pneumonia, myocarditis, pleurisy, esophageal rupture    Comorbidities that complicate the patient evaluation: Patient's presentation is complicated by their history of CAD  Social Determinants of Health: Patient's  tobacco use   increases the complexity of managing their presentation  Additional history obtained: Records reviewed Care Everywhere/External Records  Lab Tests: I Ordered, and personally interpreted labs.  The pertinent results include: Labs are unremarkable  Imaging Studies ordered: I ordered imaging studies including X-ray chest   I independently visualized and interpreted imaging which showed no acute finding I agree with the radiologist interpretation  Cardiac Monitoring: The patient was maintained on a cardiac monitor.  I personally viewed and interpreted the cardiac monitor which showed an underlying rhythm of:  sinus rhythm  Medicines ordered and prescription drug management: I ordered medication including nitroglycerin for chest pain Reevaluation of the patient after these medicines showed that the patient    improved  Reevaluation: After the interventions noted above, I reevaluated the patient and found that they have :improved  Complexity of  problems addressed: Patient's presentation is most consistent with  acute presentation with potential threat to life or bodily function  Disposition: After consideration of the diagnostic results and the patient's response to treatment,  I feel that the patent would benefit from discharge   .    3:25 AM Overall patient was stable in the emergency department. Pain had been persistent for several hours with 2 negative troponins. He had no acute EKG changes  EKG Interpretation  Date/Time:  Monday July 22 2021 01:30:47 EDT Ventricular Rate:  65 PR Interval:  164 QRS Duration: 75 QT Interval:  386 QTC Calculation: 402 R Axis:   67 Text Interpretation: Sinus rhythm  Probable left atrial enlargement Anteroseptal infarct, old Baseline wander in lead(s) V5 No significant change since last tracing Confirmed by Ripley Fraise [phone]) on 07/22/2021 1:46:05 AM      3:26 AM Heart score was approximately 4.  Given his clinical appearance and improvement, patient I agree that he is safe for discharge home but will need close cardiology follow-up He reported history of MI, but on further questioning he was told he had a "light heart attack "but never had a cardiac cath or PCI However he is high risk given his underlying comorbidities.  Patient reports his headache is resolved and is back to baseline, he will be discharged home        Final Clinical Impression(s) / ED Diagnoses Final diagnoses:  Precordial pain  Other headache syndrome    Rx / DC Orders ED Discharge Orders          Ordered    Ambulatory referral to Cardiology       Comments: If you have not heard from the Cardiology office within the next 72 hours please call [phone].   07/22/21 0244              Ripley Fraise, MD 07/22/21 [phone]

## 2021-07-22 LAB — TROPONIN I (HIGH SENSITIVITY): Troponin I (High Sensitivity): 2 ng/L (ref <18)

## 2021-07-22 MED ORDER — PROCHLORPERAZINE EDISYLATE 10 MG/2ML IJ SOLN
10.0000 mg | Freq: Once | INTRAMUSCULAR | Status: AC
  Administered 2021-07-22: 10 mg via INTRAVENOUS
  Filled 2021-07-22: qty 2

## 2021-07-22 NOTE — Discharge Instructions (Signed)

## 2021-12-24 ENCOUNTER — Encounter: Payer: Self-pay | Admitting: *Deleted

## 2022-05-22 IMAGING — DX DG THORACIC SPINE 3V
3 series · 3 of 3 positions shown · non-contrast
Comparison: Lumbar spine 05/26/2017.  Chest x-ray 06/10/2011.

CLINICAL DATA: Back pain.

EXAM:
THORACIC SPINE - 3 VIEWS

[t-spine ap]
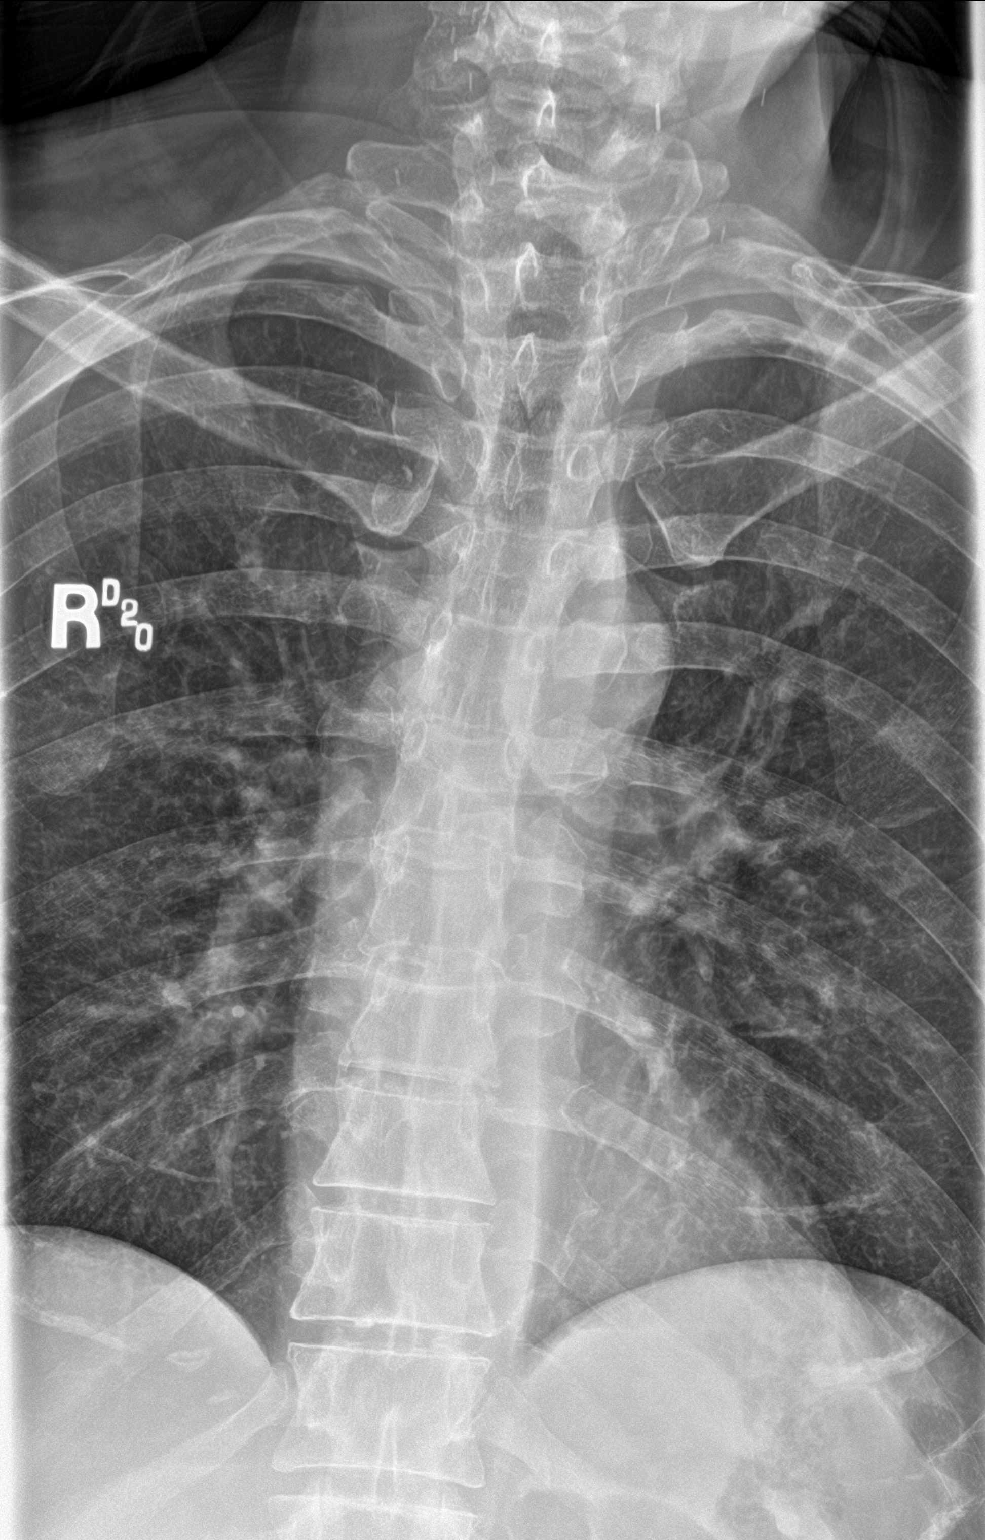

[t-spine lat]
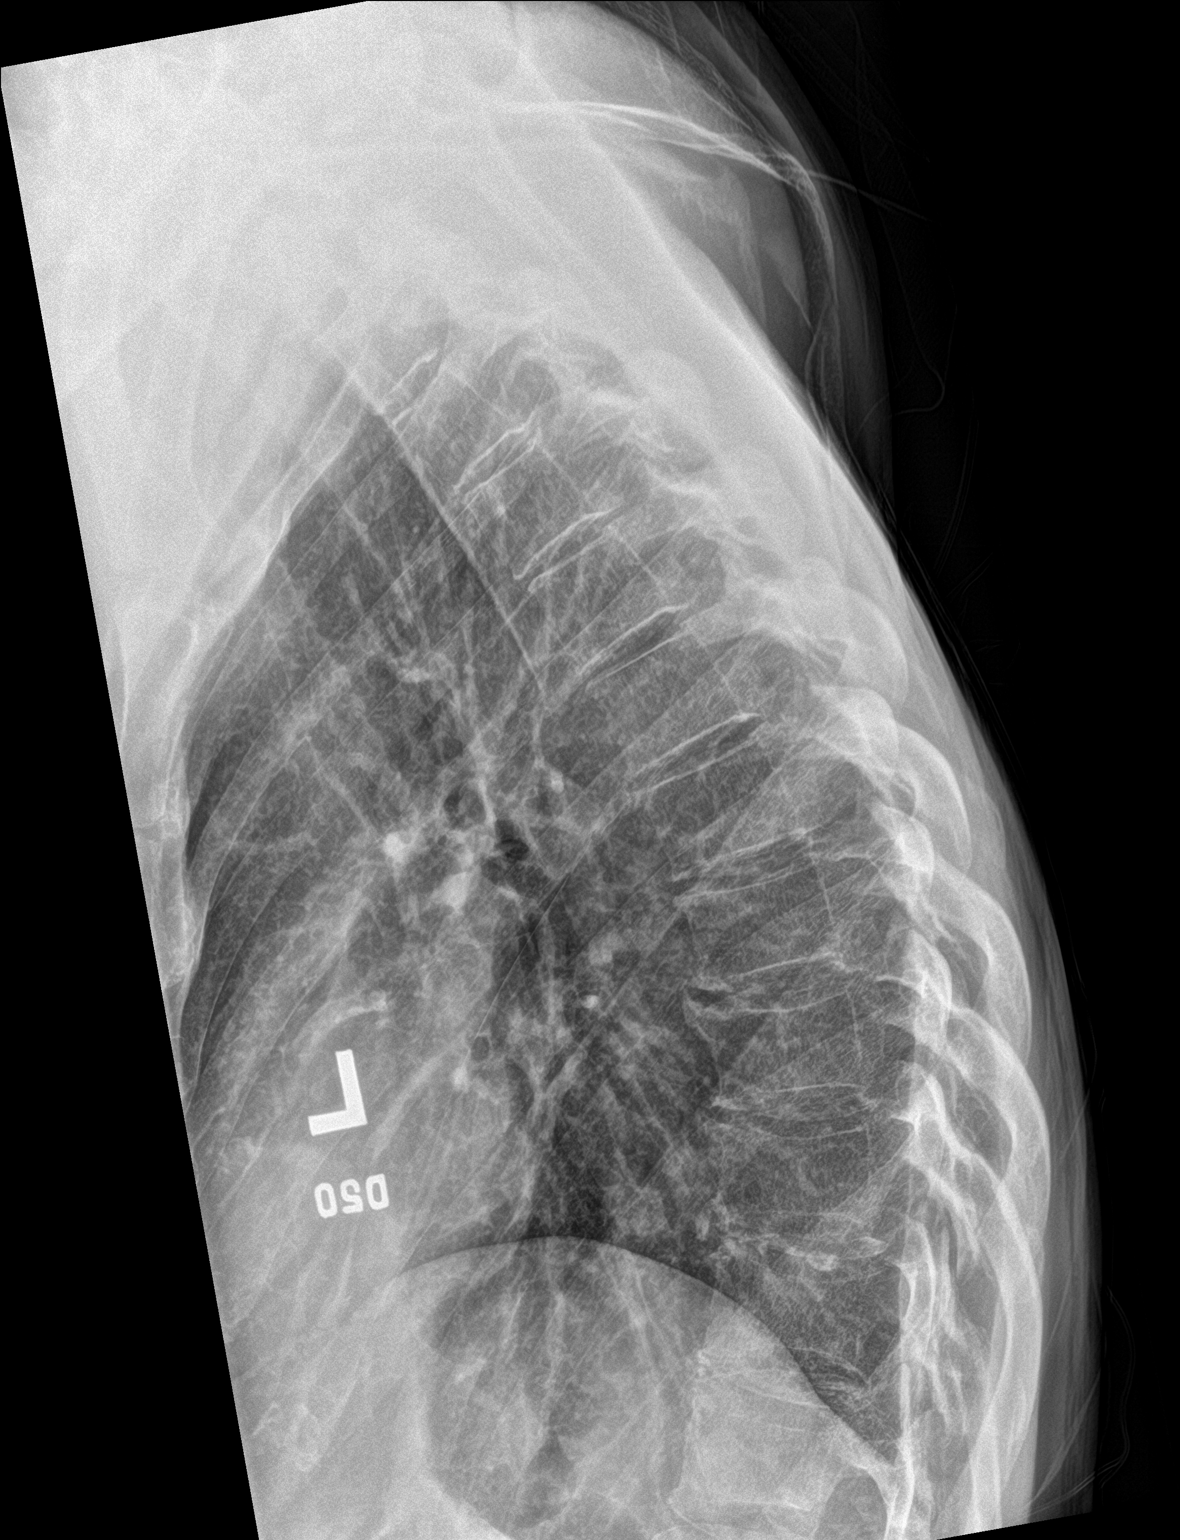

[t-spine swimmers]
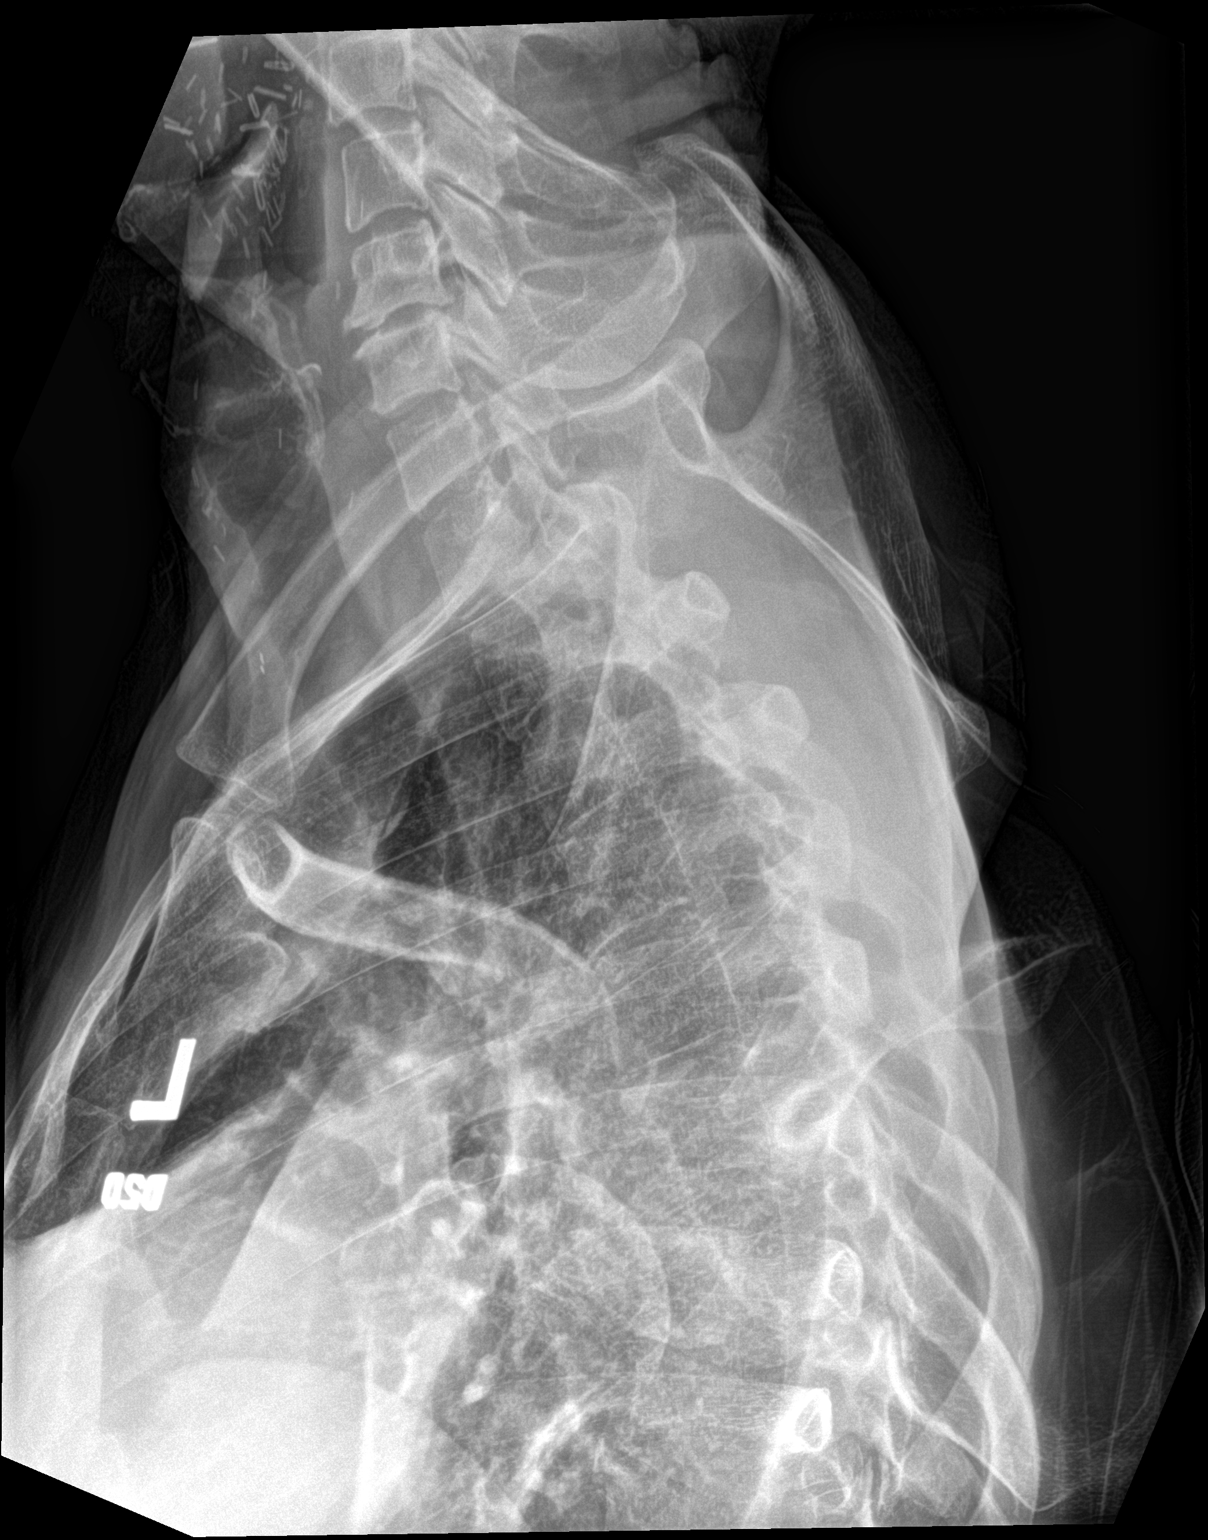

[3 of 3 positions shown; findings below may reference images not displayed]

FINDINGS: Thoracic spine scoliosis concave left. Diffuse osteopenia and
multilevel degenerative change. No acute bony abnormality
identified. Surgical clips noted the neck.
IMPRESSION: Thoracic spine scoliosis concave left. Diffuse osteopenia multilevel
degenerative change. No acute bony abnormality identified.

## 2022-08-17 ENCOUNTER — Emergency Department (HOSPITAL_COMMUNITY)
Admission: EM | Admit: 2022-08-17 | Discharge: 2022-08-17 | Disposition: A | Discharge status: Home / Self Care | Payer: Medicaid Other | Attending: Emergency Medicine | Admitting: Emergency Medicine

## 2022-08-17 ENCOUNTER — Other Ambulatory Visit: Payer: Self-pay

## 2022-08-17 ENCOUNTER — Encounter (HOSPITAL_COMMUNITY): Payer: Self-pay | Admitting: Emergency Medicine

## 2022-08-17 ENCOUNTER — Emergency Department (HOSPITAL_COMMUNITY): Payer: Medicaid Other

## 2022-08-17 DIAGNOSIS — R110 Nausea: Secondary | ICD-10-CM

## 2022-08-17 DIAGNOSIS — R748 Abnormal levels of other serum enzymes: Secondary | ICD-10-CM | POA: Insufficient documentation

## 2022-08-17 DIAGNOSIS — Z85819 Personal history of malignant neoplasm of unspecified site of lip, oral cavity, and pharynx: Secondary | ICD-10-CM | POA: Diagnosis not present

## 2022-08-17 DIAGNOSIS — R1031 Right lower quadrant pain: Secondary | ICD-10-CM | POA: Diagnosis present

## 2022-08-17 LAB — COMPREHENSIVE METABOLIC PANEL
ALT: 21 U/L (ref 0–44)
AST: 41 U/L (ref 15–41)
Albumin: 4.3 g/dL (ref 3.5–5.0)
Alkaline Phosphatase: 80 U/L (ref 38–126)
Anion gap: 8 (ref 5–15)
BUN: 10 mg/dL (ref 6–20)
CO2: 25 mmol/L (ref 22–32)
Calcium: 8.1 mg/dL — ABNORMAL LOW (ref 8.9–10.3)
Chloride: 101 mmol/L (ref 98–111)
Creatinine, Ser: 0.53 mg/dL — ABNORMAL LOW (ref 0.61–1.24)
GFR, Estimated: >60 mL/min (ref >60)
Glucose, Bld: 79 mg/dL (ref 70–99)
Potassium: 4 mmol/L (ref 3.5–5.1)
Sodium: 134 mmol/L — ABNORMAL LOW (ref 135–145)
Total Bilirubin: 0.9 mg/dL (ref 0.3–1.2)
Total Protein: 7.3 g/dL (ref 6.5–8.1)

## 2022-08-17 LAB — URINALYSIS, ROUTINE W REFLEX MICROSCOPIC
Bilirubin Urine: NEGATIVE
Glucose, UA: NEGATIVE mg/dL
Hgb urine dipstick: NEGATIVE
Ketones, ur: NEGATIVE mg/dL
Leukocytes,Ua: NEGATIVE
Nitrite: NEGATIVE
Protein, ur: NEGATIVE mg/dL
Specific Gravity, Urine: 1.008 (ref 1.005–1.030)
pH: 5 (ref 5.0–8.0)

## 2022-08-17 LAB — CBC
HCT: 44.5 % (ref 39.0–52.0)
Hemoglobin: 15 g/dL (ref 13.0–17.0)
MCH: 34.7 pg — ABNORMAL HIGH (ref 26.0–34.0)
MCHC: 33.7 g/dL (ref 30.0–36.0)
MCV: 103 fL — ABNORMAL HIGH (ref 80.0–100.0)
Platelets: 211 10*3/uL (ref 150–400)
RBC: 4.32 MIL/uL (ref 4.22–5.81)
RDW: 13.2 % (ref 11.5–15.5)
WBC: 5 10*3/uL (ref 4.0–10.5)
nRBC: 0 % (ref 0.0–0.2)

## 2022-08-17 LAB — LIPASE, BLOOD: Lipase: 74 U/L — ABNORMAL HIGH (ref 11–51)

## 2022-08-17 MED ORDER — IOHEXOL 300 MG/ML  SOLN
100.0000 mL | Freq: Once | INTRAMUSCULAR | Status: AC | PRN
  Administered 2022-08-17: 100 mL via INTRAVENOUS

## 2022-08-17 MED ORDER — ONDANSETRON HCL 4 MG PO TABS: 4.0000 mg | ORAL_TABLET | Freq: Four times a day (QID) | ORAL | 0 refills | Status: DC

## 2022-08-17 MED ORDER — IBUPROFEN 600 MG PO TABS: 600.0000 mg | ORAL_TABLET | Freq: Four times a day (QID) | ORAL | 0 refills | Status: DC | PRN

## 2022-08-17 MED ORDER — ONDANSETRON HCL 4 MG/2ML IJ SOLN
4.0000 mg | Freq: Once | INTRAMUSCULAR | Status: AC
  Administered 2022-08-17: 4 mg via INTRAVENOUS
  Filled 2022-08-17: qty 2

## 2022-08-17 MED ORDER — FENTANYL CITRATE PF 50 MCG/ML IJ SOSY
50.0000 ug | PREFILLED_SYRINGE | Freq: Once | INTRAMUSCULAR | Status: AC
  Administered 2022-08-17: 50 ug via INTRAVENOUS
  Filled 2022-08-17: qty 1

## 2022-08-17 NOTE — ED Notes (Signed)
Patient transported to CT 

## 2022-08-17 NOTE — Discharge Instructions (Addendum)
You are seen today for right-sided abdominal pain.  Your blood work and CAT scan are reassuring.  Is very important for you to follow-up with your primary care doctor.  You are given medication for nausea and pain for home.  Come back for any new or worsening symptoms.

## 2022-08-17 NOTE — ED Triage Notes (Signed)
Pt complains of RLQ pain started yesteday. Pt states he thinks he has a hernia it just feels tight and swollen. Pt states he has been dry heaving. Denies any diarrhea.

## 2022-08-17 NOTE — ED Provider Notes (Signed)
[place] EMERGENCY DEPARTMENT AT [facility] Provider Note   CSN: [id number] Arrival date & time: 08/17/22  1131     History  Chief Complaint  Patient presents with   Abdominal Pain    Jerome Washington is a 52 y.o. male.  Has PMH of oral cancer s/p surgery.  He presents the ER today complaining of right inguinal pain that started today with dry heaving.  He states he has had bulging this area before and suspects he has a hernia, normally wears a belt which he tried today with mild relief.  Pain is worse with palpation.  Denies testicular pain or swelling.  No fevers, no chills.  No other complaints.    Abdominal Pain      Home Medications Prior to Admission medications   Medication Sig Start Date End Date Taking? Authorizing Provider  ibuprofen (ADVIL) 600 MG tablet Take 1 tablet (600 mg total) by mouth every 6 (six) hours as needed. 08/17/22  Yes Brenley Priore A, PA-C  ondansetron (ZOFRAN) 4 MG tablet Take 1 tablet (4 mg total) by mouth every 6 (six) hours. 08/17/22  Yes Trinka Keshishyan A, PA-C  chlordiazePOXIDE (LIBRIUM) 25 MG capsule 50mg  PO TID x 1D, then 25-50mg  PO BID X 1D, then 25-50mg  PO QD X 1D 01/21/18   Mesner, Barbara Cower, MD  doxycycline (VIBRAMYCIN) 100 MG capsule Take 1 capsule (100 mg total) by mouth 2 (two) times daily. 09/11/17   Elson Areas, PA-C  HYDROcodone-acetaminophen (NORCO/VICODIN) 5-325 MG tablet Take 1 tablet by mouth every 4 (four) hours as needed. 08/09/18   Elson Areas, PA-C  ibuprofen (ADVIL,MOTRIN) 600 MG tablet Take 1 tablet (600 mg total) by mouth every 6 (six) hours as needed. 07/05/17   Fayrene Helper, PA-C  Menthol, Topical Analgesic, (BIOFREEZE) 4 % GEL Apply 1 application topically 2 (two) times daily as needed. 05/26/17   Mesner, Barbara Cower, MD  methocarbamol (ROBAXIN) 500 MG tablet Take 1 tablet (500 mg total) by mouth at bedtime as needed. 05/26/17   Mesner, Barbara Cower, MD      Allergies    Bee venom, Morphine and codeine, and Penicillins     Review of Systems   Review of Systems  Gastrointestinal:  Positive for abdominal pain.    Physical Exam Updated Vital Signs BP (!) 140/92   Pulse 91   Temp 98.1 F (36.7 C) (Oral)   Resp 18   Ht 5\' 8"  (1.727 m)   Wt 53.8 kg   SpO2 98%   BMI 18.03 kg/m  Physical Exam Vitals and nursing note reviewed.  Constitutional:      General: He is not in acute distress.    Appearance: He is well-developed.  HENT:     Head: Normocephalic and atraumatic.  Eyes:     Conjunctiva/sclera: Conjunctivae normal.  Cardiovascular:     Rate and Rhythm: Normal rate and regular rhythm.     Heart sounds: No murmur heard. Pulmonary:     Effort: Pulmonary effort is normal. No respiratory distress.     Breath sounds: Normal breath sounds.  Abdominal:     Palpations: Abdomen is soft.     Tenderness: There is no abdominal tenderness.  Musculoskeletal:        General: No swelling.     Cervical back: Neck supple.  Skin:    General: Skin is warm and dry.     Capillary Refill: Capillary refill takes less than 2 seconds.  Neurological:     General: No  focal deficit present.     Mental Status: He is alert.  Psychiatric:        Mood and Affect: Mood normal.     ED Results / Procedures / Treatments   Labs (all labs ordered are listed, but only abnormal results are displayed) Labs Reviewed  LIPASE, BLOOD - Abnormal; Notable for the following components:      Result Value   Lipase 74 (*)    All other components within normal limits  COMPREHENSIVE METABOLIC PANEL - Abnormal; Notable for the following components:   Sodium 134 (*)    Creatinine, Ser 0.53 (*)    Calcium 8.1 (*)    All other components within normal limits  CBC - Abnormal; Notable for the following components:   MCV 103.0 (*)    MCH 34.7 (*)    All other components within normal limits  URINALYSIS, ROUTINE W REFLEX MICROSCOPIC    EKG None  Radiology CT ABDOMEN PELVIS W CONTRAST  Result Date: 08/17/2022 CLINICAL DATA:   Right lower quadrant pain EXAM: CT ABDOMEN AND PELVIS WITH CONTRAST TECHNIQUE: Multidetector CT imaging of the abdomen and pelvis was performed using the standard protocol following bolus administration of intravenous contrast. RADIATION DOSE REDUCTION: This exam was performed according to the departmental dose-optimization program which includes automated exposure control, adjustment of the mA and/or kV according to patient size and/or use of iterative reconstruction technique. CONTRAST:  OMNIPAQUE IOHEXOL 300 MG/ML  SOLN COMPARISON:  07/23/2015 FINDINGS: Lower chest: Included lung bases are clear.  Heart size is normal. Hepatobiliary: No focal liver abnormality is seen. No gallstones, gallbladder wall thickening, or biliary dilatation. Pancreas: Unremarkable. No pancreatic ductal dilatation or surrounding inflammatory changes. Spleen: Normal in size without focal abnormality. Adrenals/Urinary Tract: Unremarkable adrenal glands. Kidneys enhance symmetrically without focal lesion, stone, or hydronephrosis. Ureters are nondilated. Urinary bladder appears unremarkable for the degree of distention. Stomach/Bowel: Stomach is within normal limits. Appendix appears normal (series 2, image 48). No evidence of bowel wall thickening, distention, or inflammatory changes. Vascular/Lymphatic: Scattered aortoiliac atherosclerotic calcifications without aneurysm. No abdominopelvic lymphadenopathy. Reproductive: Prostate is unremarkable. Other: No free fluid. No abdominopelvic fluid collection. No pneumoperitoneum. No abdominal wall hernia. Musculoskeletal: Chronic bilateral L5 pars interarticularis defects with grade 1 anterolisthesis of L5 on S1 and progressive degenerative disc disease at this level. No acute bony abnormality. IMPRESSION: 1. No acute abdominopelvic findings. Normal appendix. 2. 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 3. Aortic atherosclerosis (ICD10-I70.0). Electronically Signed   By: Duanne Guess D.O.   On: 08/17/2022 14:46    Procedures Procedures    Medications Ordered in ED Medications  ondansetron (ZOFRAN) injection 4 mg (4 mg Intravenous Given 08/17/22 1339)  fentaNYL (SUBLIMAZE) injection 50 mcg (50 mcg Intravenous Given 08/17/22 1339)  iohexol (OMNIPAQUE) 300 MG/ML solution 100 mL (100 mLs Intravenous Contrast Given 08/17/22 1422)    ED Course/ Medical Decision Making/ A&P                             Medical Decision Making This patient presents to the ED for concern of right inguinal pain, this involves an extensive number of treatment options, and is a complaint that carries with it a high risk of complications and morbidity.  The differential diagnosis includes, bowel obstruction, hernia,    Co morbidities that complicate the patient evaluation :  History of oral cancer   Additional history obtained:  Additional history obtained from EMR External records from outside source obtained and reviewed including notes   Lab Tests:  I Ordered, and personally interpreted labs.  The pertinent results include:  Mildly elevated lipase, normal UA, no leukocytosis noted, no significant electrolyte derangements    Imaging Studies ordered:  I ordered imaging studies including CT abdomen/pelvis  I independently visualized and interpreted imaging which showed intra-abdominal process specifically no hernia, no obstruction, no appendicitis no kidney stone I agree with the radiologist interpretation   Problem List / ED Course / Critical interventions / Medication management  Patient having right ankle pain and tenderness with dry heaving today.  No fevers or chills.  I do not palpate any hernia but patient felt like there was some bulging.  CT ordered for further evaluation and does not show hernia, obstruction or any other acute abnormalities.  Labs showed very mildly  elevated lipase which may be from his excessive retching, has been slightly over the past.  There are no clinical or radiographic findings of pancreatitis.  Patient's pain is now resolved.  He is requesting to go home, he had no rebound guarding or rigidity on his exam, he has no testicular pain, no urinary symptoms.  Discussed with him we will send home with symptomatic management advised on strict return precautions follow-up with PCP I ordered medication including fentanyl for pain  Reevaluation of the patient after these medicines showed that the patient resolved Given IV fluids for tachycardia for presumed dehydration.  Heart rate improved to 91 bpm I have reviewed the patients home medicines and have made adjustments as needed       Amount and/or Complexity of Data Reviewed Labs: ordered. Radiology: ordered.  Risk Prescription drug management.           Final Clinical Impression(s) / ED Diagnoses Final diagnoses:  Right lower quadrant abdominal pain  Nausea    Rx / DC Orders ED Discharge Orders          Ordered    ibuprofen (ADVIL) 600 MG tablet  Every 6 hours PRN        08/17/22 1509    ondansetron (ZOFRAN) 4 MG tablet  Every 6 hours        08/17/22 1509              Josem Kaufmann 08/17/22 1509    Eber Hong, MD 08/18/22 1134

## 2023-04-29 ENCOUNTER — Encounter (HOSPITAL_COMMUNITY): Payer: Self-pay

## 2023-04-29 ENCOUNTER — Other Ambulatory Visit: Payer: Self-pay

## 2023-04-29 ENCOUNTER — Emergency Department (HOSPITAL_COMMUNITY)
Admission: EM | Admit: 2023-04-29 | Discharge: 2023-04-29 | Disposition: A | Discharge status: Home / Self Care | Attending: Emergency Medicine | Admitting: Emergency Medicine

## 2023-04-29 DIAGNOSIS — M545 Low back pain, unspecified: Secondary | ICD-10-CM | POA: Diagnosis present

## 2023-04-29 DIAGNOSIS — M5442 Lumbago with sciatica, left side: Secondary | ICD-10-CM | POA: Diagnosis not present

## 2023-04-29 DIAGNOSIS — Z720 Tobacco use: Secondary | ICD-10-CM | POA: Insufficient documentation

## 2023-04-29 DIAGNOSIS — M5432 Sciatica, left side: Secondary | ICD-10-CM

## 2023-04-29 HISTORY — DX: Friedreich ataxia: G11.11

## 2023-04-29 MED ORDER — MELOXICAM 15 MG PO TABS: 15.0000 mg | ORAL_TABLET | Freq: Every day | ORAL | 0 refills | Status: AC

## 2023-04-29 MED ORDER — METHOCARBAMOL 500 MG PO TABS: 500.0000 mg | ORAL_TABLET | Freq: Two times a day (BID) | ORAL | 0 refills | Status: DC | PRN

## 2023-04-29 MED ORDER — OXYCODONE-ACETAMINOPHEN 5-325 MG PO TABS
2.0000 | ORAL_TABLET | Freq: Once | ORAL | Status: AC
  Administered 2023-04-29: 2 via ORAL
  Filled 2023-04-29: qty 2

## 2023-04-29 MED ORDER — METHYLPREDNISOLONE 4 MG PO TBPK: ORAL_TABLET | ORAL | 0 refills | Status: DC

## 2023-04-29 MED ORDER — METHOCARBAMOL 500 MG PO TABS
500.0000 mg | ORAL_TABLET | Freq: Once | ORAL | Status: AC
  Administered 2023-04-29: 500 mg via ORAL
  Filled 2023-04-29: qty 1

## 2023-04-29 NOTE — Discharge Instructions (Signed)
 Please take the medication called Medrol, this is a 6-day medication that is tapered from 6 pills on the first day, 5 pills on the second day, 4 pills on the third day, etc. until all the medication is gone.  Please take Robaxin, 500 mg up to 2 or 3 times a day as needed for muscle spasm, this is a muscle relaxer, it may cause generalized weakness, sleepiness and you should not drive or do important things while taking this medication.  This includes driving a vehicle or taking care of young children, these things should not be done while taking this medication.   Please take Mobic,  once daily as needed for pain - this in an antiinflammatory medicine (NSAID) and is similar to ibuprofen - many people feel that it is stronger than ibuprofen and it is easier to take since it is a smaller pill.  Please use this only for 1 week - if your pain persists, you will need to follow up with your doctor in the office for ongoing guidance and pain control.  You will need to talk to your family doctor about getting further x-rays of your back, anytime someone has cancer in their body there is a concern that it has gone to the spine.  It did not appear to be in your spine 2 years ago when you had x-rays done but they may need to do an MRI to look for that.  I have given you the phone number for the spinal doctor that is on-call today, you can call them to make a follow-up to be seen within the next couple of weeks.  ER for severe worsening symptoms

## 2023-04-29 NOTE — ED Triage Notes (Signed)
 Pt c/o back pain x 2 days that is worse than baseline chronic back pain. 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 Pt ambulatory.

## 2023-04-29 NOTE — ED Provider Notes (Signed)
 [place] EMERGENCY DEPARTMENT AT [facility] Provider Note   CSN: [id number] Arrival date & time: 04/29/23  1005     History  Chief Complaint  Patient presents with   Back Pain    Jerome Washington is a 53 y.o. male.   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  The pain seems to flareup from time to time and after having to pick up his mother off the ground a few days ago when she fell he started to have increasing back pain in the left lower back and radiating down his left leg.  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  The patient has had no fevers no chills, he has no known cancer out of his spine and based on x-rays from a couple years ago it look like he had diffuse osteopenia and arthritis.  Home Medications Prior to Admission medications   Medication Sig Start Date End Date Taking? Authorizing Provider  meloxicam (MOBIC) 15 MG tablet Take 1 tablet (15 mg total) by mouth daily for 14 days. 04/29/23 05/13/23 Yes Eber Hong, MD  methocarbamol (ROBAXIN) 500 MG tablet Take 1 tablet (500 mg total) by mouth 2 (two) times daily as needed for muscle spasms. 04/29/23  Yes Eber Hong, MD  methylPREDNISolone (MEDROL DOSEPAK) 4 MG TBPK tablet Taper ovdr 6 days 04/29/23  Yes Eber Hong, MD  chlordiazePOXIDE (LIBRIUM) 25 MG capsule 50mg  PO TID x 1D, then 25-50mg  PO BID X 1D, then 25-50mg  PO QD X 1D 01/21/18   Mesner, Barbara Cower, MD  Menthol, Topical Analgesic, (BIOFREEZE) 4 % GEL Apply 1 application topically 2 (two) times daily as needed. 05/26/17   Mesner, Barbara Cower, MD      Allergies    Bee venom, Morphine and codeine, and Penicillins    Review of Systems   Review of  Systems  Musculoskeletal:  Positive for back pain.  All other systems reviewed and are negative.   Physical Exam Updated Vital Signs BP (!) 138/90   Pulse 98   Temp 99.2 F (37.3 C) (Oral)   Resp 18   Ht 1.829 m (6')   Wt 53.8 kg   SpO2 97%   BMI 16.09 kg/m  Physical Exam Vitals and nursing note reviewed.  Constitutional:      General: He is not in acute distress.    Appearance: He is well-developed.  HENT:     Head: Normocephalic and atraumatic.     Mouth/Throat:     Pharynx: No oropharyngeal exudate.  Eyes:     General: No scleral icterus.       Right eye: No discharge.        Left eye: No discharge.     Conjunctiva/sclera: Conjunctivae normal.     Pupils: Pupils are equal, round, and reactive to light.  Neck:     Thyroid: No thyromegaly.     Vascular: No JVD.  Cardiovascular:     Rate and Rhythm: Normal rate and regular rhythm.     Heart sounds: Normal heart sounds. No murmur heard.    No friction rub. No gallop.  Pulmonary:  Effort: Pulmonary effort is normal. No respiratory distress.     Breath sounds: Normal breath sounds. No wheezing or rales.  Abdominal:     General: Bowel sounds are normal. There is no distension.     Palpations: Abdomen is soft. There is no mass.     Tenderness: There is no abdominal tenderness.  Musculoskeletal:        General: No tenderness. Normal range of motion.     Cervical back: Normal range of motion and neck supple.     Right lower leg: No edema.     Left lower leg: No edema.     Comments: The patient is able to ambulate with an antalgic gait, strength is normal in all 4 extremities, reflexes are bilaterally depressed at the patellar reflexes, sensation intact, he can dorsiflex at both of his feet without any issues and plantarflex at the ankles without any issues.  Lymphadenopathy:     Cervical: No cervical adenopathy.  Skin:    General: Skin is warm and dry.     Findings: No erythema or rash.  Neurological:     Mental  Status: He is alert.     Coordination: Coordination normal.  Psychiatric:        Behavior: Behavior normal.     ED Results / Procedures / Treatments   Labs (all labs ordered are listed, but only abnormal results are displayed) Labs Reviewed - No data to display  EKG None  Radiology No results found.  Procedures Procedures    Medications Ordered in ED Medications  oxyCODONE-acetaminophen (PERCOCET/ROXICET) 5-325 MG per tablet 2 tablet (has no administration in time range)  methocarbamol (ROBAXIN) tablet 500 mg (has no administration in time range)    ED Course/ Medical Decision Making/ A&P                                 Medical Decision Making Risk Prescription drug management.    This patient presents to the ED for concern of back pain differential diagnosis includes muscle strain, sciatica, does not seem to be related to cauda equina or severe spinal stenosis though he does have some chronic back pain.  No new neurologic symptoms    Additional history obtained:  Additional history obtained from medical record External records from outside source obtained and reviewed including prior x-rays    Medicines ordered and prescription drug management:  I ordered medication including Percocet and Robaxin for pain and Reevaluation of the patient after these medicines showed that the patient improved patient I have reviewed the patients home medicines and have made adjustments as needed   Problem List / ED Course:  Will to the plan, he will be discharged home with prescriptions for Mobic, Robaxin and Medrol, can follow-up with outpatient setting   Social Determinants of Health:  Chronic back pain          Final Clinical Impression(s) / ED Diagnoses Final diagnoses:  Sciatica, left side    Rx / DC Orders ED Discharge Orders          Ordered    methocarbamol (ROBAXIN) 500 MG tablet  2 times daily PRN        04/29/23 1113    meloxicam (MOBIC) 15  MG tablet  Daily        04/29/23 1113    methylPREDNISolone (MEDROL DOSEPAK) 4 MG TBPK tablet        04/29/23 1113  Eber Hong, MD 04/29/23 1115

## 2023-07-14 ENCOUNTER — Ambulatory Visit: Admitting: Orthopedic Surgery

## 2023-08-05 ENCOUNTER — Encounter (INDEPENDENT_AMBULATORY_CARE_PROVIDER_SITE_OTHER): Payer: Self-pay | Admitting: *Deleted

## 2023-09-20 ENCOUNTER — Other Ambulatory Visit: Payer: Self-pay

## 2023-09-20 ENCOUNTER — Encounter (HOSPITAL_COMMUNITY): Payer: Self-pay | Admitting: Emergency Medicine

## 2023-09-20 ENCOUNTER — Emergency Department (HOSPITAL_COMMUNITY)
Admission: EM | Admit: 2023-09-20 | Discharge: 2023-09-20 | Disposition: A | Discharge status: Home / Self Care | Attending: Emergency Medicine | Admitting: Emergency Medicine

## 2023-09-20 DIAGNOSIS — Z85818 Personal history of malignant neoplasm of other sites of lip, oral cavity, and pharynx: Secondary | ICD-10-CM | POA: Insufficient documentation

## 2023-09-20 DIAGNOSIS — I10 Essential (primary) hypertension: Secondary | ICD-10-CM | POA: Insufficient documentation

## 2023-09-20 DIAGNOSIS — H6121 Impacted cerumen, right ear: Secondary | ICD-10-CM | POA: Diagnosis not present

## 2023-09-20 DIAGNOSIS — J029 Acute pharyngitis, unspecified: Secondary | ICD-10-CM | POA: Diagnosis present

## 2023-09-20 LAB — GROUP A STREP BY PCR: Group A Strep by PCR: NOT DETECTED

## 2023-09-20 LAB — RESP PANEL BY RT-PCR (RSV, FLU A&B, COVID)  RVPGX2
Influenza A by PCR: NEGATIVE
Influenza B by PCR: NEGATIVE
Resp Syncytial Virus by PCR: NEGATIVE
SARS Coronavirus 2 by RT PCR: NEGATIVE

## 2023-09-20 MED ORDER — PREDNISONE 20 MG PO TABS: 40.0000 mg | ORAL_TABLET | Freq: Every day | ORAL | 0 refills | Status: AC

## 2023-09-20 MED ORDER — KETOROLAC TROMETHAMINE 15 MG/ML IJ SOLN
15.0000 mg | Freq: Once | INTRAMUSCULAR | Status: DC
Start: 2023-09-20 — End: 2023-09-20
  Filled 2023-09-20: qty 1

## 2023-09-20 MED ORDER — CARBAMIDE PEROXIDE 6.5 % OT SOLN
5.0000 [drp] | Freq: Two times a day (BID) | OTIC | 0 refills | Status: AC
Start: 2023-09-20 — End: 2023-09-24

## 2023-09-20 MED ORDER — PREDNISONE 20 MG PO TABS
40.0000 mg | ORAL_TABLET | Freq: Once | ORAL | Status: AC
  Administered 2023-09-20: 40 mg via ORAL
  Filled 2023-09-20: qty 2

## 2023-09-20 MED ORDER — KETOROLAC TROMETHAMINE 15 MG/ML IJ SOLN
15.0000 mg | Freq: Once | INTRAMUSCULAR | Status: AC
  Administered 2023-09-20: 15 mg via INTRAMUSCULAR
  Filled 2023-09-20: qty 1

## 2023-09-20 NOTE — ED Provider Notes (Addendum)
 [place] EMERGENCY DEPARTMENT AT [facility] Provider Note   CSN: [id number] Arrival date & time: 09/20/23  1045     Patient presents with: Sore Throat   Jerome Washington is a 53 y.o. male.  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  Today presents the ER complaining of sore throat scribed as  a burning pain,, burning in his ears bilaterally and burning in his nose for the past 2 to 3 days.  Pain is rated 7 out of 10 on pain scale denies fever or chills, states it is painful to swallow but he is able to swallow without difficulty.  He has not take any medications at home, denies cough or runny nose but he has some mild congestion.  Denies any changes in his voice.  He also feels like his right ear is stopped up.   HPI     Prior to Admission medications   Medication Sig Start Date End Date Taking? Authorizing Provider  carbamide peroxide (DEBROX) 6.5 % OTIC solution Place 5 drops into the right ear 2 (two) times daily for 4 days. 09/20/23 09/24/23 Yes Milarose Savich A, PA-C  predniSONE  (DELTASONE ) 20 MG tablet Take 2 tablets (40 mg total) by mouth daily for 4 days. 09/20/23 09/24/23 Yes Bethel Gaglio A, PA-C  chlordiazePOXIDE  (LIBRIUM ) 25 MG capsule 50mg  PO TID x 1D, then 25-50mg  PO BID X 1D, then 25-50mg  PO QD X 1D 01/21/18   Mesner, Jason, MD  gabapentin (NEURONTIN) 300 MG capsule Take 300 mg by mouth 3 (three) times daily. 06/17/23   [provider]  ibuprofen  (ADVIL ) 800 MG tablet Take 800 mg by mouth every 6 (six) hours as needed.    [provider]  Menthol , Topical Analgesic, (BIOFREEZE) 4 % GEL Apply 1 application topically 2 (two) times daily as needed. 05/26/17   Mesner, Selinda, MD  methocarbamol  (ROBAXIN ) 500 MG tablet Take 1 tablet (500 mg total) by mouth 2 (two) times daily as needed for muscle spasms. 04/29/23   Cleotilde Rogue, MD  methocarbamol   (ROBAXIN ) 500 MG tablet Take 500 mg by mouth 3 (three) times daily as needed.    [provider]  methylPREDNISolone  (MEDROL  DOSEPAK) 4 MG TBPK tablet Taper ovdr 6 days 04/29/23   Cleotilde Rogue, MD  propranolol (INDERAL) 10 MG tablet Take 10 mg by mouth 2 (two) times daily. 06/17/23   [provider]    Allergies: Bee venom, Morphine  and codeine , and Penicillins    Review of Systems  Updated Vital Signs BP (!) 129/96 (BP Location: Right Arm)   Pulse 78   Temp 98.2 F (36.8 C) (Oral)   Resp 18   Ht 5' 11 (1.803 m)   Wt 54.4 kg   SpO2 95%   BMI 16.74 kg/m   Physical Exam Vitals and nursing note reviewed.  Constitutional:      General: He is not in acute distress.    Appearance: He is well-developed.  HENT:     Head: Normocephalic and atraumatic.     Left Ear: Tympanic membrane normal.     Mouth/Throat:     Mouth: Mucous membranes are moist. No angioedema.     Pharynx: Uvula midline. Pharyngeal swelling and posterior oropharyngeal erythema present. No oropharyngeal exudate or uvula swelling.  Eyes:     Conjunctiva/sclera: Conjunctivae normal.  Cardiovascular:     Rate and Rhythm:  Normal rate and regular rhythm.     Heart sounds: No murmur heard. Pulmonary:     Effort: Pulmonary effort is normal. No respiratory distress.     Breath sounds: Normal breath sounds.  Abdominal:     Palpations: Abdomen is soft.     Tenderness: There is no abdominal tenderness.  Musculoskeletal:        General: No swelling.     Cervical back: Neck supple.  Skin:    General: Skin is warm and dry.     Capillary Refill: Capillary refill takes less than 2 seconds.  Neurological:     Mental Status: He is alert.  Psychiatric:        Mood and Affect: Mood normal.     (all labs ordered are listed, but only abnormal results are displayed) Labs Reviewed  GROUP A STREP BY PCR  RESP PANEL BY RT-PCR (RSV, FLU A&B, COVID)  [id number]    EKG: None  Radiology: No results  found.   Procedures   Medications Ordered in the ED  predniSONE  (DELTASONE ) tablet 40 mg (40 mg Oral Given 09/20/23 1148)  ketorolac  (TORADOL ) 15 MG/ML injection 15 mg (15 mg Intramuscular Given 09/20/23 1148)                                    Medical Decision Making Ddx: Strep pharyngitis, peritonsillar abscess, retropharyngeal abscess, viral pharyngitis, other  ED course: Patient is here for 2 to 3 days of burning in his throat ears and nose, he is afebrile, he is able to swallow but states is somewhat painful.  Has not had any nausea or vomiting.  Noted patient's voice sounds hoarse but he states this is his baseline after his history of throat cancer.  He is negative for COVID flu and strep.  He has got mucosal erythema and swelling but his uvula is midline and he is feeling better after prednisone  and Toradol .  Will discharge home with supportive care and advised on close outpatient follow-up.  He did have a cerumen impaction of the right ear but did not tolerate attempted gentle cerumen removal with curette and is going to use Debrox drops and follow-up with ENT.  He was given strict return precautions.  Risk OTC drugs. Prescription drug management.        Final diagnoses:  Pharyngitis, unspecified etiology  Impacted cerumen of right ear    ED Discharge Orders          Ordered    predniSONE  (DELTASONE ) 20 MG tablet  Daily        09/20/23 1250    carbamide peroxide (DEBROX) 6.5 % OTIC solution  2 times daily        09/20/23 1252               Suellen Sherran LABOR, PA-C 09/20/23 1304    [address], PA-C 09/20/23 1306    Melvenia Motto, MD 09/20/23 1526

## 2023-09-20 NOTE — ED Triage Notes (Signed)
 Pt to the ED with complaints of sore throat and burning ears for the last 2-3 days.  Hx of throat CA.

## 2023-09-20 NOTE — Discharge Instructions (Addendum)
 Was a pleasure taking care of you today.  You are seen in the ER for sore throat with burning in your nose and ears.  You are negative for strep as well as COVID, influenza and RSV.  Your symptoms are likely due to a virus.  The prednisone  will help with the inflammation and pain you can also take over-the-counter Tylenol  and ibuprofen  as directed on packaging for discomfort.  If you have increased pain, fevers, develop change in your voice, or start having trouble swallowing please come back to the ER right away.  Otherwise follow-up with PCP and/or ENT.  You also have impacted earwax in the right ear.  You can use the Debrox drops and follow-up with PCP and/or ENT for possible irrigation if it is not improved.

## 2023-11-15 ENCOUNTER — Emergency Department (HOSPITAL_COMMUNITY)
Admission: EM | Admit: 2023-11-15 | Discharge: 2023-11-15 | Disposition: A | Discharge status: Home / Self Care | Attending: Emergency Medicine | Admitting: Emergency Medicine

## 2023-11-15 ENCOUNTER — Emergency Department (HOSPITAL_COMMUNITY)

## 2023-11-15 ENCOUNTER — Other Ambulatory Visit: Payer: Self-pay

## 2023-11-15 ENCOUNTER — Encounter (HOSPITAL_COMMUNITY): Payer: Self-pay

## 2023-11-15 DIAGNOSIS — C109 Malignant neoplasm of oropharynx, unspecified: Secondary | ICD-10-CM | POA: Diagnosis not present

## 2023-11-15 DIAGNOSIS — I10 Essential (primary) hypertension: Secondary | ICD-10-CM | POA: Insufficient documentation

## 2023-11-15 DIAGNOSIS — R059 Cough, unspecified: Secondary | ICD-10-CM | POA: Insufficient documentation

## 2023-11-15 DIAGNOSIS — R070 Pain in throat: Secondary | ICD-10-CM | POA: Diagnosis present

## 2023-11-15 LAB — COMPREHENSIVE METABOLIC PANEL WITH GFR
ALT: 6 U/L (ref 0–44)
AST: 22 U/L (ref 15–41)
Albumin: 4 g/dL (ref 3.5–5.0)
Alkaline Phosphatase: 121 U/L (ref 38–126)
Anion gap: 8 (ref 5–15)
BUN: 8 mg/dL (ref 6–20)
CO2: 34 mmol/L — ABNORMAL HIGH (ref 22–32)
Calcium: 10.3 mg/dL (ref 8.9–10.3)
Chloride: 92 mmol/L — ABNORMAL LOW (ref 98–111)
Creatinine, Ser: 0.47 mg/dL — ABNORMAL LOW (ref 0.61–1.24)
GFR, Estimated: >60 mL/min
Glucose, Bld: 85 mg/dL (ref 70–99)
Potassium: 4.3 mmol/L (ref 3.5–5.1)
Sodium: 135 mmol/L (ref 135–145)
Total Bilirubin: 0.5 mg/dL (ref 0.0–1.2)
Total Protein: 7.4 g/dL (ref 6.5–8.1)

## 2023-11-15 LAB — LACTIC ACID, PLASMA
Lactic Acid, Venous: 0.8 mmol/L (ref 0.5–1.9)
Lactic Acid, Venous: 0.8 mmol/L (ref 0.5–1.9)

## 2023-11-15 LAB — CBC WITH DIFFERENTIAL/PLATELET
Abs Immature Granulocytes: 0.03 K/uL (ref 0.00–0.07)
Basophils Absolute: 0 K/uL (ref 0.0–0.1)
Basophils Relative: 0 %
Eosinophils Absolute: 0 K/uL (ref 0.0–0.5)
Eosinophils Relative: 0 %
HCT: 44.3 % (ref 39.0–52.0)
Hemoglobin: 14.6 g/dL (ref 13.0–17.0)
Immature Granulocytes: 0 %
Lymphocytes Relative: 10 %
Lymphs Abs: 1.2 K/uL (ref 0.7–4.0)
MCH: 34 pg (ref 26.0–34.0)
MCHC: 33 g/dL (ref 30.0–36.0)
MCV: 103.3 fL — ABNORMAL HIGH (ref 80.0–100.0)
Monocytes Absolute: 1 K/uL (ref 0.1–1.0)
Monocytes Relative: 9 %
Neutro Abs: 9.1 K/uL — ABNORMAL HIGH (ref 1.7–7.7)
Neutrophils Relative %: 81 %
Platelets: 379 K/uL (ref 150–400)
RBC: 4.29 MIL/uL (ref 4.22–5.81)
RDW: 11.7 % (ref 11.5–15.5)
WBC: 11.4 K/uL — ABNORMAL HIGH (ref 4.0–10.5)
nRBC: 0 % (ref 0.0–0.2)

## 2023-11-15 LAB — GROUP A STREP BY PCR: Group A Strep by PCR: NOT DETECTED

## 2023-11-15 LAB — RESP PANEL BY RT-PCR (RSV, FLU A&B, COVID)  RVPGX2
Influenza A by PCR: NEGATIVE
Influenza B by PCR: NEGATIVE
Resp Syncytial Virus by PCR: NEGATIVE
SARS Coronavirus 2 by RT PCR: NEGATIVE

## 2023-11-15 MED ORDER — IOHEXOL 300 MG/ML  SOLN
75.0000 mL | Freq: Once | INTRAMUSCULAR | Status: AC | PRN
  Administered 2023-11-15: 75 mL via INTRAVENOUS

## 2023-11-15 MED ORDER — DEXAMETHASONE SOD PHOSPHATE PF 10 MG/ML IJ SOLN
10.0000 mg | Freq: Once | INTRAMUSCULAR | Status: AC
  Administered 2023-11-15: 10 mg via INTRAVENOUS

## 2023-11-15 MED ORDER — KETOROLAC TROMETHAMINE 15 MG/ML IJ SOLN
15.0000 mg | Freq: Once | INTRAMUSCULAR | Status: AC
  Administered 2023-11-15: 15 mg via INTRAVENOUS
  Filled 2023-11-15: qty 1

## 2023-11-15 MED ORDER — CLINDAMYCIN PHOSPHATE 900 MG/50ML IV SOLN
900.0000 mg | Freq: Once | INTRAVENOUS | Status: AC
  Administered 2023-11-15: 900 mg via INTRAVENOUS
  Filled 2023-11-15: qty 50

## 2023-11-15 MED ORDER — LACTATED RINGERS IV BOLUS
500.0000 mL | Freq: Once | INTRAVENOUS | Status: AC
  Administered 2023-11-15: 500 mL via INTRAVENOUS

## 2023-11-15 MED ORDER — FENTANYL CITRATE (PF) 100 MCG/2ML IJ SOLN
50.0000 ug | Freq: Once | INTRAMUSCULAR | Status: AC
  Administered 2023-11-15: 50 ug via INTRAVENOUS
  Filled 2023-11-15: qty 2

## 2023-11-15 NOTE — ED Triage Notes (Signed)
 Pt to er, pt states that he was here a couple of weeks ago for the same, states that he was given abx for a sinus infection, but he is still having the sinus problems.

## 2023-11-15 NOTE — ED Triage Notes (Signed)
 Pt has hot potato voice and has enlarged tonsil and sore throat

## 2023-11-15 NOTE — ED Provider Notes (Signed)
 [place] EMERGENCY DEPARTMENT AT [facility] Provider Note   CSN: [id number] Arrival date & time: 11/15/23  1028     Patient presents with: Sinusitis   Jerome Washington is a 53 y.o. male with a history of squamous cell carcinoma of the mouth, hypertension, GERD, MI, presents with concern for throat pain for the past week.  Reports he has had ongoing sinus congestion for about the past month, but this throat pain has seemed to worsen in the past week.  He reports difficulty with swallowing.  He denies any fever or chills at home. He reports mild dry cough at home ongoing for the past week or so.    Sinusitis Associated symptoms: sore throat   Associated symptoms: no fever        Prior to Admission medications   Medication Sig Start Date End Date Taking? Authorizing Provider  chlordiazePOXIDE  (LIBRIUM ) 25 MG capsule 50mg  PO TID x 1D, then 25-50mg  PO BID X 1D, then 25-50mg  PO QD X 1D 01/21/18   Mesner, Jason, MD  gabapentin (NEURONTIN) 300 MG capsule Take 300 mg by mouth 3 (three) times daily. 06/17/23   [provider]  ibuprofen  (ADVIL ) 800 MG tablet Take 800 mg by mouth every 6 (six) hours as needed.    [provider]  Menthol , Topical Analgesic, (BIOFREEZE) 4 % GEL Apply 1 application topically 2 (two) times daily as needed. 05/26/17   Mesner, Selinda, MD  methocarbamol  (ROBAXIN ) 500 MG tablet Take 1 tablet (500 mg total) by mouth 2 (two) times daily as needed for muscle spasms. 04/29/23   Cleotilde Rogue, MD  methocarbamol  (ROBAXIN ) 500 MG tablet Take 500 mg by mouth 3 (three) times daily as needed.    [provider]  methylPREDNISolone  (MEDROL  DOSEPAK) 4 MG TBPK tablet Taper ovdr 6 days 04/29/23   Cleotilde Rogue, MD  propranolol (INDERAL) 10 MG tablet Take 10 mg by mouth 2 (two) times daily. 06/17/23   [provider]    Allergies: Bee venom, Morphine  and codeine , and Penicillins    Review of Systems  Constitutional:  Negative for fever.   HENT:  Positive for sore throat.     Updated Vital Signs BP (!) 152/98   Pulse 93   Temp 97.8 F (36.6 C) (Oral)   Resp 19   Ht 5' 11 (1.803 m)   Wt 48.5 kg   SpO2 95%   BMI 14.92 kg/m   Physical Exam Vitals and nursing note reviewed.  Constitutional:      General: He is not in acute distress.    Appearance: He is well-developed.     Comments: Chronically ill appearing  HENT:     Head: Normocephalic and atraumatic.     Mouth/Throat:     Comments: Opens mouth fully, no trismus.  Patchy white spots under the tongue.  Enlarged right-sided tonsil, unclear if this is an abscess. Able to visualize some of the posterior oropharynx  Swallows secretions without difficulty.  Does have muffled sounding voice. Eyes:     Conjunctiva/sclera: Conjunctivae normal.  Cardiovascular:     Rate and Rhythm: Normal rate and regular rhythm.     Heart sounds: No murmur heard. Pulmonary:     Effort: Pulmonary effort is normal. No respiratory distress.     Breath sounds: Rhonchi present.  Abdominal:     Palpations: Abdomen is soft.     Tenderness: There is no abdominal tenderness.  Musculoskeletal:        General: No  swelling.     Cervical back: Neck supple.  Skin:    General: Skin is warm and dry.     Capillary Refill: Capillary refill takes less than 2 seconds.  Neurological:     Mental Status: He is alert.  Psychiatric:        Mood and Affect: Mood normal.     (all labs ordered are listed, but only abnormal results are displayed) Labs Reviewed  COMPREHENSIVE METABOLIC PANEL WITH GFR - Abnormal; Notable for the following components:      Result Value   Chloride 92 (*)    CO2 34 (*)    Creatinine, Ser 0.47 (*)    All other components within normal limits  CBC WITH DIFFERENTIAL/PLATELET - Abnormal; Notable for the following components:   WBC 11.4 (*)    MCV 103.3 (*)    Neutro Abs 9.1 (*)    All other components within normal limits  RESP PANEL BY RT-PCR (RSV, FLU A&B, COVID)   RVPGX2  CULTURE, BLOOD (ROUTINE X 2)  CULTURE, BLOOD (ROUTINE X 2)  GROUP A STREP BY PCR  LACTIC ACID, PLASMA  LACTIC ACID, PLASMA    EKG: None  Radiology: CT Soft Tissue Neck W Contrast Result Date: 11/15/2023 EXAM: CT NECK WITH CONTRAST 11/15/2023 01:42:25 PM TECHNIQUE: CT of the neck was performed with the administration of 75 mL of iohexol  (OMNIPAQUE ) 300 MG/ML solution. Multiplanar reformatted images are provided for review. Automated exposure control, iterative reconstruction, and/or weight based adjustment of the mA/kV was utilized to reduce the radiation dose to as low as reasonably achievable. COMPARISON: CT neck with contrast 07/20/2019 at [facility]. The images are not currently available. CLINICAL HISTORY: Soft tissue infection suspected, neck, xray done. Pt to er, pt states that he was here a couple of weeks ago for the same, states that he was given abx for a sinus infection, but he is still having the sinus problems. FINDINGS: LIMITATIONS/ARTIFACTS: The study is mildly degraded by patient motion. AERODIGESTIVE TRACT: Status post partial glossectomy and neck dissection is noted. Diffuse mass-like mucosal thickening within the posterior oropharynx measures 6.3 x 3.2 x 4.3 cm, crossing midline. A 14 mm low density collection or lesion is present at the right glosso-tonsillar sulcus. SALIVARY GLANDS: A 4 mm calcification is present anteriorly in the right parotid gland. A subcutaneous cystic lesion creates some mass effect on the left parotid gland. The left parotid gland is otherwise unremarkable. The submandibular glands have been resected bilaterally. THYROID: Unremarkable. LYMPH NODES: A right posterolateral retropharyngeal lymph node measures 2.3 x 1.5 cm. No other cervical adenopathy is present. SOFT TISSUES: A subcutaneous moderate density cystic lesion over the left parotid gland measures 3.2 x 2.0 x 2.8 cm. A slightly higher density subcutaneous lesion is present anteriorly in  the left cheek measuring 1.7 x 1.4 cm. BRAIN, ORBITS, SINUSES AND MASTOIDS: No acute abnormality. LUNGS AND MEDIASTINUM: No acute abnormality. BONES: Chronic endplate sclerotic changes are present at C5-C6 with uncovertebral and foraminal narrowing bilaterally. An anterior osteophyte at C5 is fractured anteriorly. IMPRESSION: 1. Diffuse mass-like mucosal thickening within the posterior oropharynx measuring 6.3 x 3.2 x 4.3 cm, crossing midline, with a 14 mm low-density collection at the right glossotonsillar sulcus. 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 Recommend ent consultation 2. Right posterolateral retropharyngeal lymph node measuring 2.3 x 1.5 cm is consistent with metastatic disease. 3. Subcutaneous cystic lesion over the left parotid gland measuring 3.2 x 2.0 x 2.8 cm, and a subcutaneous  lesion in the left cheek measuring 1.7 x 1.4 cm. These are nonspecific these may represent dermoid inclusion cysts. Metastatic disease is considered less likely. Electronically signed by: Lonni Necessary MD 11/15/2023 02:04 PM EDT RP Workstation: [id number]   DG Chest Portable 1 View Result Date: 11/15/2023 EXAM: 1 VIEW(S) XRAY OF THE CHEST 11/15/2023 12:54:00 PM COMPARISON: 07/21/21 CLINICAL HISTORY: cough. Per chart: Pt to er, pt states that he was here a couple of weeks ago for the same, states that he was given abx for a sinus infection, but he is still having the sinus problems. FINDINGS: LUNGS AND PLEURA: Pulmonary hyperinflation. No focal pulmonary opacity. No pulmonary edema. No pleural effusion. No pneumothorax. HEART AND MEDIASTINUM: No acute abnormality of the cardiac and mediastinal silhouettes. BONES AND SOFT TISSUES: No acute osseous abnormality. IMPRESSION: 1. No acute cardiopulmonary process identified. Electronically signed by: Waddell Calk MD 11/15/2023 01:02 PM EDT RP Workstation: [id number]     Procedures   Medications Ordered in the ED  dexamethasone   (DECADRON ) injection 10 mg (10 mg Intravenous Given 11/15/23 1229)  clindamycin  (CLEOCIN ) IVPB 900 mg (0 mg Intravenous Stopped 11/15/23 1339)  lactated ringers bolus 500 mL (500 mLs Intravenous New Bag/Given 11/15/23 1234)  ketorolac  (TORADOL ) 15 MG/ML injection 15 mg (15 mg Intravenous Given 11/15/23 1228)  fentaNYL  (SUBLIMAZE ) injection 50 mcg (50 mcg Intravenous Given 11/15/23 1228)  iohexol  (OMNIPAQUE ) 300 MG/ML solution 75 mL (75 mLs Intravenous Contrast Given 11/15/23 1336)    Clinical Course as of 11/15/23 1542  Sun Nov 15, 2023  1506 Dr. Tobie recommends that if patient is breathing ok that he can follow up in his office tomorrow to discuss further treatment [AF]    Clinical Course User Index [AF] Veta Palma, PA-C                                 Medical Decision Making Amount and/or Complexity of Data Reviewed Labs: ordered. Radiology: ordered.  Risk Prescription drug management.     Differential diagnosis includes but is not limited to peritonsillar abscess, malignancy, COVID, flu, RSV, pneumonia, strep  ED Course:  Upon initial evaluation, patient is chronically ill-appearing, but no acute distress.  He is noted to have a muffled voice when talking.  On examination of the posterior oropharynx, patient has a enlarged piece of tissue in the posterior oropharynx.  This does not seem consistent with an abscess.  Patient is swallowing secretions and maintaining 95% oxygen saturation on room air.  No signs of airway compromise at this time.  Will obtain CT neck imaging for further evaluation of this area and patient's neck pain.  Labs Ordered: I Ordered, and personally interpreted labs.  The pertinent results include:   CBC with leukocytosis of 11.4 CMP unremarkable COVID, flu, RSV, strep negative Lactic acid within normal limits Blood cultures pending  Imaging Studies ordered: I ordered imaging studies including CT neck, chest x-ray I independently visualized  the imaging with scope of interpretation limited to determining acute life threatening conditions related to emergency care. Imaging showed  CT soft tissue neck: IMPRESSION:  1. Diffuse mass-like mucosal thickening within the posterior oropharynx  measuring 6.3 x 3.2 x 4.3 cm, crossing midline, with a 14 mm low-density  collection at the right glossotonsillar sulcus. 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 Recommend ent consultation  2. Right posterolateral retropharyngeal lymph node measuring 2.3 x 1.5 cm is  consistent with metastatic  disease.  3. Subcutaneous cystic lesion over the left parotid gland measuring 3.2 x 2.0 x  2.8 cm, and a subcutaneous lesion in the left cheek measuring 1.7 x 1.4 cm.  These are nonspecific these may represent dermoid inclusion cysts. Metastatic  disease is considered less likely.   Chest x -ray:  IMPRESSION:  1. No acute cardiopulmonary process identified.   I agree with the radiologist interpretation   Cardiac Monitoring: / EKG: The patient was maintained on a cardiac monitor.  I personally viewed and interpreted the cardiac monitored which showed an underlying rhythm of: Normal sinus rhythm   Consultations Obtained: I requested consultation with the ENT Dr. Tobie,  and discussed lab and imaging findings as well as pertinent plan - they recommend: Follow-up with his office tomorrow to discuss further management options if patient breathing ok. He does not recommend any antibiotics or steroids at this time  Medications Given: Clindamycin  LR bolus Decadron  Fentanyl  toradol   Upon re-evaluation, patient remains well-appearing with stable vitals.  He is still maintaining good oxygen saturation on room air.  He is swallowing without difficulty.  Protecting airway. He does not feel particularly short of breath, but reports more shortness of breath when he lays flat.  I discussed with the patient that his CT scan of the  neck does show recurrence of his cancer.  This seems to be what is causing his throat pain today and the muffled voice.  No signs of abscess or infection.  His COVID, flu, RSV, and strep testing is negative.  Chest x-ray without signs of pneumonia.   I asked patient if he would be able to attend a follow-up appointment with Dr. Tobie tomorrow, and he states that he will be able to attend this appointment.  We discussed that he needs to call Dr. Anthony office first thing tomorrow morning to schedule this appointment for tomorrow.  He understands that if he does not attend this appointment to receive treatment, his cancer could spread further into the airway and cause him to not be able to breath. He verbalizes understanding.  Stable and appropriate for discharge home  Impression: Oropharyngeal cancer  Disposition:  The patient was discharged home with instructions to call ENT Dr. Anthony office first thing tomorrow morning for an appointment tomorrow.  Attend his follow-up appointment tomorrow for further management of his oropharyngeal cancer. Return precautions given and patient verbalized understanding.    Record Review: External records from outside source obtained and reviewed including [facility] notes where he underwent resection of his oropharyngeal cancer in 2020.     This chart was dictated using voice recognition software, Dragon. Despite the best efforts of this provider to proofread and correct errors, errors may still occur which can change documentation meaning.       Final diagnoses:  Oropharyngeal carcinoma [facility])    ED Discharge Orders     None          Veta Palma, PA-C 11/15/23 1542    Melvenia Motto, MD 11/15/23 [phone]

## 2023-11-15 NOTE — Discharge Instructions (Addendum)
 Unfortunately, your CT scan showed that your mouth and throat cancer has returned.  This is what is causing your symptoms today.  You need to call the office of Dr. Tobie, the ENT doctor, to schedule an appointment for tomorrow. The phone number to reach his office [phone]. Call first thing tomorrow morning. He will discuss treatment options with you at this appointment.  His office is located at is [address], [address] [place] [address]  Call 911 or return immediately to the ER if you develop shortness of breath or difficulty breathing, any other new or concerning symptoms.

## 2023-11-16 ENCOUNTER — Encounter (HOSPITAL_COMMUNITY): Payer: Self-pay

## 2023-11-16 ENCOUNTER — Encounter (INDEPENDENT_AMBULATORY_CARE_PROVIDER_SITE_OTHER): Payer: Self-pay | Admitting: Otolaryngology

## 2023-11-16 ENCOUNTER — Other Ambulatory Visit: Payer: Self-pay

## 2023-11-16 ENCOUNTER — Ambulatory Visit (INDEPENDENT_AMBULATORY_CARE_PROVIDER_SITE_OTHER): Admitting: Otolaryngology

## 2023-11-16 VITALS — BP 121/84 | HR 109 | Ht 71.0 in | Wt 104.0 lb

## 2023-11-16 DIAGNOSIS — J392 Other diseases of pharynx: Secondary | ICD-10-CM

## 2023-11-16 DIAGNOSIS — F17200 Nicotine dependence, unspecified, uncomplicated: Secondary | ICD-10-CM

## 2023-11-16 DIAGNOSIS — F17210 Nicotine dependence, cigarettes, uncomplicated: Secondary | ICD-10-CM | POA: Diagnosis not present

## 2023-11-16 MED ORDER — PREDNISONE 20 MG PO TABS: 20.0000 mg | ORAL_TABLET | Freq: Every day | ORAL | 0 refills | Status: DC

## 2023-11-16 NOTE — Progress Notes (Signed)
 SDW call  Patient was given pre-op instructions over the phone. Patient verbalized understanding of instructions provided.     PCP - Raina Petite Cardiologist - denies Pulmonary:    PPM/ICD - denies Device Orders - na Rep Notified - na   Chest x-ray - 11/15/2023 EKG -  11/15/2023 Stress Test - ECHO -  Cardiac Cath -   Sleep Study/sleep apnea/CPAP: denies  Non-diabetic   Blood Thinner Instructions: denies Aspirin Instructions:denies   ERAS Protcol - Clears until 1330, no orders   Anesthesia review: Yes, HTN, MI, seen in ED 11/15/2023   Patient denies shortness of breath, fever, cough and chest pain over the phone call  Your procedure is scheduled on Tuesday November 17, 2023  Report to [facility] Main Entrance A at  1400 PM., then check in with the Admitting office.  Call this number if you have problems the morning of surgery:  [phone]   If you have any questions prior to your surgery date call [phone]: Open Monday-Friday 8am-4pm If you experience any cold or flu symptoms such as cough, fever, chills, shortness of breath, etc. between now and your scheduled surgery, please notify us  at the above number     Remember:  Do not eat after midnight the night before your surgery  You may drink clear liquids until  1330 the day of your surgery.   Clear liquids allowed are: Water, Non-Citrus Juices (without pulp), Carbonated Beverages, Clear Tea, Black Coffee ONLY (NO MILK, CREAM OR POWDERED CREAMER of any kind), and Gatorade   Take these medicines the morning of surgery with A SIP OF WATER:  Librium , prednisone , propranolol,   As needed: Flonase, robaxin   As of today, STOP taking any Aspirin (unless otherwise instructed by your surgeon) Aleve , Naproxen , Ibuprofen , Motrin , Advil , Goody's, BC's, all herbal medications, fish oil, and all vitamins.

## 2023-11-16 NOTE — Addendum Note (Signed)
 Addended by: Katrisha Segall on: 11/16/2023 02:05 PM   Modules accepted: Orders

## 2023-11-16 NOTE — Progress Notes (Addendum)
 Dear Dr. Benjamin, Here is my assessment for our mutual patient, Jerome Washington. Thank you for allowing me the opportunity to care for your patient. Please do not hesitate to contact me should you have any other questions. Sincerely, Dr. Eldora Blanch  Otolaryngology Clinic Note Referring provider: Dr. Benjamin HPI:  Jerome Washington is a 53 y.o. male kindly referred by Dr. Benjamin for evaluation of oropharynx mass  Initial visit (10/2023): H/o oral cavity SCCa s/p resection and then now having throat pain for past month. Seen in ED, dx with ear infection but not improved. Having hard time swallowing but lays flat fairly well. Lots of mucus. Did have h/o trach Does smoke, drinks fair amount of alcohol  Personal or FHx of bleeding dz or anesthesia difficulty: no  AP/AC: no  Tobacco: yes  PMHx: MI, HTN  Independent Review of Additional Tests or Records:  ED notes 11/15/2023 Jerome Washington - noted h/o SCCa mouth with throat pain for past week; difficulty swallowing, no fever/chills. Ct done, concerning for carcinoma; Dx: OP carcinoma;a Rx: ref to ENT, no signs of airway compromise. CBC and CMP 11/15/2023: WBC 11.4, Plt 379; BUN/Cr 8/0.47 CT Neck with contrast independently interpreted (11/15/2023) - agree with read - noted posterior oropharynx mass with left parotid cyst; also noted to have right posterolateral retropharyngeal lymph node, likely metastatic disease; no other noted necrotic neck LN Dr. 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 No pertinent surgical  history.  History reviewed. No pertinent family history.   Social Connections: Not on file      Current Outpatient Medications:    carbamide peroxide (DEBROX) 6.5 % OTIC solution, INSTILL 5 DROPS INTO AFFECTED EAR(S) BY OTIC ROUTE 2 TIMES PER DAY, Disp: , Rfl:    chlordiazePOXIDE  (LIBRIUM ) 25 MG capsule, 50mg  PO TID x 1D, then 25-50mg  PO BID X 1D, then 25-50mg  PO QD X 1D, Disp: 10 capsule, Rfl: 0   fluticasone (FLONASE) 50 MCG/ACT nasal spray, USE 1 SPRAY(S) IN EACH NOSTRIL EVERY 12 HOURS FOR ALLERGIES/NOSE CONGESTION, Disp: , Rfl:    gabapentin (NEURONTIN) 300 MG capsule, Take 300 mg by mouth 3 (three) times daily., Disp: , Rfl:    ibuprofen  (ADVIL ) 800 MG tablet, Take 800 mg by mouth every 6 (six) hours as needed., Disp: , Rfl:    loratadine (CLARITIN) 10 MG tablet, TAKE 1 TABLET BY MOUTH ONCE DAILY AT BEDTIME FOR ALLERGIES, Disp: , Rfl:    Menthol , Topical Analgesic, (BIOFREEZE) 4 % GEL, Apply 1 application topically 2 (two) times daily as needed., Disp: 118 mL, Rfl: 0   methocarbamol  (ROBAXIN ) 500 MG tablet, Take 1 tablet (500 mg total) by mouth 2 (two) times daily as needed for muscle spasms., Disp: 20 tablet, Rfl: 0   methocarbamol  (ROBAXIN ) 500 MG tablet, Take 500 mg by mouth 3 (three) times daily as needed., Disp: , Rfl:    methylPREDNISolone  (MEDROL  DOSEPAK) 4 MG TBPK tablet, Taper ovdr 6 days, Disp: 21 tablet, Rfl: 0   propranolol (INDERAL) 10 MG tablet, Take 10 mg by mouth 2 (two) times daily.,  Disp: , Rfl:    traZODone (DESYREL) 50 MG tablet, Take 50 mg by mouth daily., Disp: , Rfl:    Physical Exam:   BP 121/84 (BP Location: Right Arm, Patient Position: Sitting, Cuff Size: Normal)   Pulse (!) 109   Ht 5' 11 (1.803 m)   Wt 104 lb (47.2 kg)   SpO2 93%   BMI 14.51 kg/m   Salient findings:  CN II-XII intact Bilateral EAC clear and TM intact with well pneumatized middle ear spaces Anterior rhinoscopy: Septum intact; bilateral inferior turbinates without significant  hypertrophy No lesions of oral cavity - prior RFFF well healed, fairly good tongue mobility; OP with large mass, hyponasal voice No obviously palpable neck masses/lymphadenopathy/thyromegaly; THIN NECK; prior tracheostomy and ND scar well healed No respiratory distress or stridor currently; gurgly voice, lays flat; TFL was indicated to better evaluate the proximal airway, given the patient's history and exam findings, and is detailed below.  Seprately Identifiable Procedures:  Prior to initiating any procedures, risks/benefits/alternatives were explained to the patient and verbal consent obtained. Procedure Note Pre-procedure diagnosis:  oropharynx mass Post-procedure diagnosis: Same Procedure: Transnasal Fiberoptic Laryngoscopy, CPT 31575 - Mod 25 Indication: see above Complications: None apparent EBL: 0 mL  The procedure was undertaken to further evaluate the patient's complaint above, with mirror exam inadequate for appropriate examination due to gag reflex and poor patient tolerance  Procedure:  Patient was identified as correct patient. Verbal consent was obtained. The nose was sprayed with oxymetazoline and 4% lidocaine . The The flexible laryngoscope was passed through the nose to view the nasal cavity, pharynx (oropharynx, hypopharynx) and larynx.  The larynx was examined at rest and during multiple phonatory tasks. Documentation was obtained and reviewed with patient. The scope was removed. The patient tolerated the procedure well.  Findings: The nasal cavity and nasopharynx did not reveal any masses or lesions, mucosa appeared to be without obvious lesions. The tongue base, pharyngeal walls, piriform sinuses, vallecula, epiglottis and postcricoid region are normal in appearance EXCEPT: large b/l oropharynx mass with significant amount of secretions; epiglottis, AE fold, hypopharynx and VF appear without pathology -- able to sneak past with the camera. The visualized portion of the  subglottis and proximal trachea is widely patent. The vocal folds are mobile bilaterally. There are no lesions on the free edge of the vocal folds nor elsewhere in the larynx worrisome for malignancy.    Fiberoptic intubation should be feasible v/s awake tracheostomy Electronically signed by: [name], MD 11/16/2023 2:01 PM   Impression & Plans:  Jerome Washington is a 52 y.o. male with:  1. Mass of oropharynx   2. Tobacco use disorder    Likely 2nd primary. Large OP mass with RP lymph node. Need to establish safe airway. Will post for urgent trach tomorrow. He is currently doing ok but suspect not a lot of time.  He is in agreement We discussed DL/Bx as well --- once has safe airway and dx, can consider referral to [facility] v/s [facility].  Phone: [phone]  Given the patient's tobacco use, I also discussed cessation and options for cessation, including counseling. Counseled patient on the dangers of tobacco use, advised patient to stop smoking, and reviewed strategies to maximize success.  Total time spent with this was 4 minutes.    See below regarding exact medications prescribed this encounter including dosages and route: No orders of the defined types were placed in this encounter.     Thank you for allowing me the opportunity  to care for your patient. Please do not hesitate to contact me should you have any other questions.  Sincerely, Eldora Blanch, MD Otolaryngologist (ENT), [facility] Phone: [phone] Fax: [phone]  11/16/2023, 2:01 PM   I have personally spent 60 minutes involved in face-to-face and non-face-to-face activities for this patient on the day of the visit.  Professional time spent excludes any procedures performed but includes the following activities, in addition to those noted in the documentation: preparing to see the patient (review of outside documentation and results), performing a medically appropriate examination, counseling,  documenting in the electronic health record, independently interpreting results (CT)

## 2023-11-16 NOTE — Anesthesia Preprocedure Evaluation (Signed)
 Anesthesia Evaluation  Patient identified by MRN, date of birth, ID band Patient awake    Reviewed: Allergy & Precautions, H&P , NPO status , Patient's Chart, lab work & pertinent test results  History of Anesthesia Complications Negative for: history of anesthetic complications  Airway Mallampati: II  TM Distance: <3 FB Neck ROM: Limited  Mouth opening: Limited Mouth Opening Comment: Unable to tolerate laying flat Dental no notable dental hx.    Pulmonary neg pulmonary ROS, neg COPD, Current Smoker and Patient abstained from smoking.   Pulmonary exam normal breath sounds clear to auscultation       Cardiovascular hypertension, + Past MI  Normal cardiovascular exam Rhythm:Regular Rate:Normal     Neuro/Psych negative neurological ROS  negative psych ROS   GI/Hepatic Neg liver ROS,GERD  ,,  Endo/Other  negative endocrine ROS    Renal/GU negative Renal ROS  negative genitourinary   Musculoskeletal negative musculoskeletal ROS (+)    Abdominal   Peds negative pediatric ROS (+)  Hematology negative hematology ROS (+)   Anesthesia Other Findings Hx of oropharygeal cancer, s/p partial glossectomy. Hx of trach. Now with recurrent posterior oropharyngeal mass.   Sx: labored breathing, unable to eat solids, unable to lay flat  Reproductive/Obstetrics negative OB ROS                              Anesthesia Physical Anesthesia Plan  ASA: 4  Anesthesia Plan: MAC   Post-op Pain Management:    Induction:   PONV Risk Score and Plan: 0 and Ondansetron  and Dexamethasone   Airway Management Planned: Tracheostomy  Additional Equipment: None  Intra-op Plan:   Post-operative Plan: Possible Post-op intubation/ventilation  Informed Consent: I have reviewed the patients History and Physical, chart, labs and discussed the procedure including the risks, benefits and alternatives for the proposed  anesthesia with the patient or authorized representative who has indicated his/her understanding and acceptance.     Dental advisory given  Plan Discussed with: CRNA  Anesthesia Plan Comments: (See PAT note written 11/16/2023 by Jamion Carter, PA-C. 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 ED visit 11/15/2023 with concern for recurrent cancer. See imaging reports.   Per 11/16/2023 evaluation by Dr. Tobie, Fiberoptic intubation should be feasible v/s awake tracheostomy.  After review with surgical team: primary plan is awake trach)         Anesthesia Quick Evaluation

## 2023-11-16 NOTE — Progress Notes (Signed)
 Anesthesia Chart Review:  Case: [id number] Date/Time: 11/17/23 1615   Procedures:      CREATION, TRACHEOSTOMY - AWAKE Tracheostomy v/s Fiberoptic intubation, direct laryngoscopy with Biopsy     MICROLARYNGOSCOPY, WITH PROCEDURE USING LASER   Anesthesia type: Choice   Diagnosis: Mass of oropharynx [J39.2]   Pre-op diagnosis: Mass of oropharynx   [facility] ROOM 10 / [facility]   Surgeons: Tobie Eldora NOVAK, MD       DISCUSSION: Patient is a 53 year old male scheduled for the above procedure. Oral cancer s/p glossectomy in 2020. He presented to ED on 11/14/2024 with sinus symptoms, prolonged sore throat and difficulty swallowing. Muffled voice noted. Mass noted in posterior oropharynx and confirmed on CT.   He was seen by Dr. Tobie on 11/16/2023.  Transnasal fiberoptic laryngoscopy done showing: Findings: The nasal cavity and nasopharynx did not reveal any masses or lesions, mucosa appeared to be without obvious lesions. The tongue base, pharyngeal walls, piriform sinuses, vallecula, epiglottis and postcricoid region are normal in appearance EXCEPT: large b/l oropharynx mass with significant amount of secretions; epiglottis, AE fold, hypopharynx and VF appear without pathology -- able to sneak past with the camera. The visualized portion of the subglottis and proximal trachea is widely patent. The vocal folds are mobile bilaterally. There are no lesions on the free edge of the vocal folds nor elsewhere in the larynx worrisome for malignancy.    History includes smoking, HTN, MI (per [facility] Anesthesia evaluation 11/05/2018: Possible MI in 2011. Patient reports he had chest pain, went to ED and was given SL NTG and the chest pain resolved. Was told he had an MI but never followed up with Cardiology or had any other intervention. 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 Notes suggest moderate alcohol intake.  He had labs and EKG done during ED visit on 11/15/2023.  Anesthesia team to evaluate on the day of surgery.  Anesthesia team to evaluate on the day of surgery. Per 11/16/2023 evaluation by Dr. Tobie, Fiberoptic intubation should be feasible v/s awake tracheostomy.   VS: Ht 5' 11 (1.803 m)   Wt 47.2 kg   BMI 14.51 kg/m  BP Readings from Last 3 Encounters:  11/16/23 121/84  11/15/23 (!) 152/111  09/20/23 (!) 130/93   Pulse Readings from Last 3 Encounters:  11/16/23 (!) 109  11/15/23 63  09/20/23 73    PROVIDERS: [name], [name], NP is listed as PCP   LABS: Most recent results in [facility] include: Lab Results  Component Value Date   WBC 11.4 (H) 11/15/2023   HGB 14.6 11/15/2023   HCT 44.3 11/15/2023   PLT 379 11/15/2023   GLUCOSE 85 11/15/2023   ALT 6 11/15/2023   AST 22 11/15/2023   NA 135 11/15/2023   K 4.3 11/15/2023   CL 92 (L) 11/15/2023   CREATININE 0.47 (L) 11/15/2023   BUN 8 11/15/2023   CO2 34 (H) 11/15/2023  11/15/2023 blood culture showed no growth to date as of the time of this dictation.   IMAGES: CT Soft Tissue neck 11/15/2023: IMPRESSION: 1. Diffuse mass-like mucosal thickening within the posterior oropharynx measuring 6.3 x 3.2 x 4.3 cm, crossing midline, with a 14 mm low-density collection  at the right glossotonsillar sulcus. 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 Recommend ent consultation 2. Right posterolateral retropharyngeal lymph node measuring 2.3 x 1.5 cm is consistent with metastatic disease. 3. Subcutaneous cystic lesion over the left parotid gland measuring 3.2 x 2.0 x 2.8 cm, and a subcutaneous lesion in the left cheek measuring 1.7 x 1.4  cm. These are nonspecific these may represent dermoid inclusion cysts. Metastatic disease is considered less likely.   1V PCXR 11/14/2025: FINDINGS: - LUNGS AND PLEURA: Pulmonary hyperinflation. No focal pulmonary opacity. No pulmonary edema. No pleural effusion. No pneumothorax. - HEART AND MEDIASTINUM: No acute abnormality of the cardiac and mediastinal silhouettes. - BONES AND SOFT TISSUES: No acute osseous abnormality. IMPRESSION: 1. No acute cardiopulmonary process identified.    EKG: EKG 11/15/2023: Sinus rhythm Biatrial enlargement Anteroseptal infarct, old   CV: N/A  Past Medical History:  Diagnosis Date   Cancer (HCC)    mouth   Chronic back pain    Hypertension    Myocardial infarction (HCC)    Sclerosis, hereditary spinal (HCC)     Past Surgical History:  Procedure Laterality Date   NECK SURGERY     mouth cancer   TRACHEOSTOMY      MEDICATIONS: No current facility-administered medications for this encounter.    carbamide peroxide (DEBROX) 6.5 % OTIC solution   chlordiazePOXIDE  (LIBRIUM ) 25 MG capsule   fluticasone (FLONASE) 50 MCG/ACT nasal spray   gabapentin (NEURONTIN) 300 MG capsule   ibuprofen  (ADVIL ) 800 MG tablet   loratadine (CLARITIN) 10 MG tablet   Menthol , Topical Analgesic, (BIOFREEZE) 4 % GEL   methocarbamol  (ROBAXIN ) 500 MG tablet   methocarbamol  (ROBAXIN ) 500 MG tablet   methylPREDNISolone  (MEDROL  DOSEPAK) 4 MG TBPK tablet   predniSONE  (DELTASONE ) 20 MG tablet   propranolol (INDERAL) 10 MG tablet   traZODone (DESYREL) 50 MG tablet    Isaiah Ruder, PA-C Surgical Short Stay/Anesthesiology [facility] Phone [phone] [facility] Phone [phone] 11/16/2023 3:59 PM

## 2023-11-17 ENCOUNTER — Encounter (HOSPITAL_COMMUNITY)
Admission: AD | Disposition: A | Discharge status: Home Health | Payer: Self-pay | Source: Home / Self Care | Attending: Internal Medicine

## 2023-11-17 ENCOUNTER — Inpatient Hospital Stay (HOSPITAL_COMMUNITY)

## 2023-11-17 ENCOUNTER — Inpatient Hospital Stay (HOSPITAL_COMMUNITY)
Admission: AD | Admit: 2023-11-17 | Discharge: 2023-11-27 | DRG: 011 | Disposition: A | Discharge status: Home / Self Care | Attending: Internal Medicine | Admitting: Internal Medicine

## 2023-11-17 ENCOUNTER — Encounter (HOSPITAL_COMMUNITY): Payer: Self-pay

## 2023-11-17 ENCOUNTER — Inpatient Hospital Stay (HOSPITAL_COMMUNITY): Payer: Self-pay | Admitting: Vascular Surgery

## 2023-11-17 ENCOUNTER — Ambulatory Visit (HOSPITAL_COMMUNITY): Payer: Self-pay | Admitting: Vascular Surgery

## 2023-11-17 DIAGNOSIS — G8929 Other chronic pain: Secondary | ICD-10-CM | POA: Diagnosis present

## 2023-11-17 DIAGNOSIS — I252 Old myocardial infarction: Secondary | ICD-10-CM

## 2023-11-17 DIAGNOSIS — Z930 Tracheostomy status: Principal | ICD-10-CM

## 2023-11-17 DIAGNOSIS — I10 Essential (primary) hypertension: Secondary | ICD-10-CM | POA: Diagnosis present

## 2023-11-17 DIAGNOSIS — J690 Pneumonitis due to inhalation of food and vomit: Secondary | ICD-10-CM | POA: Diagnosis not present

## 2023-11-17 DIAGNOSIS — K219 Gastro-esophageal reflux disease without esophagitis: Secondary | ICD-10-CM | POA: Diagnosis present

## 2023-11-17 DIAGNOSIS — I2510 Atherosclerotic heart disease of native coronary artery without angina pectoris: Secondary | ICD-10-CM | POA: Diagnosis present

## 2023-11-17 DIAGNOSIS — Z885 Allergy status to narcotic agent status: Secondary | ICD-10-CM | POA: Diagnosis not present

## 2023-11-17 DIAGNOSIS — R1310 Dysphagia, unspecified: Secondary | ICD-10-CM | POA: Diagnosis present

## 2023-11-17 DIAGNOSIS — J392 Other diseases of pharynx: Secondary | ICD-10-CM

## 2023-11-17 DIAGNOSIS — M549 Dorsalgia, unspecified: Secondary | ICD-10-CM | POA: Diagnosis present

## 2023-11-17 DIAGNOSIS — Z85810 Personal history of malignant neoplasm of tongue: Secondary | ICD-10-CM

## 2023-11-17 DIAGNOSIS — Z681 Body mass index (BMI) 19 or less, adult: Secondary | ICD-10-CM

## 2023-11-17 DIAGNOSIS — Z79899 Other long term (current) drug therapy: Secondary | ICD-10-CM | POA: Diagnosis not present

## 2023-11-17 DIAGNOSIS — E876 Hypokalemia: Secondary | ICD-10-CM | POA: Diagnosis present

## 2023-11-17 DIAGNOSIS — L89151 Pressure ulcer of sacral region, stage 1: Secondary | ICD-10-CM | POA: Diagnosis present

## 2023-11-17 DIAGNOSIS — E440 Moderate protein-calorie malnutrition: Secondary | ICD-10-CM

## 2023-11-17 DIAGNOSIS — D380 Neoplasm of uncertain behavior of larynx: Secondary | ICD-10-CM | POA: Diagnosis not present

## 2023-11-17 DIAGNOSIS — I959 Hypotension, unspecified: Secondary | ICD-10-CM | POA: Diagnosis not present

## 2023-11-17 DIAGNOSIS — R0603 Acute respiratory distress: Secondary | ICD-10-CM

## 2023-11-17 DIAGNOSIS — C109 Malignant neoplasm of oropharynx, unspecified: Secondary | ICD-10-CM | POA: Diagnosis present

## 2023-11-17 DIAGNOSIS — E871 Hypo-osmolality and hyponatremia: Secondary | ICD-10-CM | POA: Diagnosis present

## 2023-11-17 DIAGNOSIS — J9601 Acute respiratory failure with hypoxia: Secondary | ICD-10-CM | POA: Diagnosis not present

## 2023-11-17 DIAGNOSIS — Z880 Allergy status to penicillin: Secondary | ICD-10-CM

## 2023-11-17 DIAGNOSIS — F17210 Nicotine dependence, cigarettes, uncomplicated: Secondary | ICD-10-CM | POA: Diagnosis present

## 2023-11-17 DIAGNOSIS — E43 Unspecified severe protein-calorie malnutrition: Secondary | ICD-10-CM | POA: Diagnosis present

## 2023-11-17 DIAGNOSIS — Z91030 Bee allergy status: Secondary | ICD-10-CM

## 2023-11-17 HISTORY — PX: TRACHEOSTOMY TUBE PLACEMENT: SHX814

## 2023-11-17 HISTORY — PX: MICROLARYNGOSCOPY WITH LASER: SHX5972

## 2023-11-17 HISTORY — DX: Malignant (primary) neoplasm, unspecified: C80.1

## 2023-11-17 LAB — MRSA NEXT GEN BY PCR, NASAL: MRSA by PCR Next Gen: NOT DETECTED

## 2023-11-17 SURGERY — CREATION, TRACHEOSTOMY
Anesthesia: Choice | Site: Throat

## 2023-11-17 MED ORDER — SUCCINYLCHOLINE CHLORIDE 200 MG/10ML IV SOSY
PREFILLED_SYRINGE | INTRAVENOUS | Status: AC
  Filled 2023-11-17: qty 10

## 2023-11-17 MED ORDER — ESMOLOL HCL 100 MG/10ML IV SOLN
INTRAVENOUS | Status: AC
  Filled 2023-11-17: qty 10

## 2023-11-17 MED ORDER — LIDOCAINE 2% (20 MG/ML) 5 ML SYRINGE
INTRAMUSCULAR | Status: AC
  Filled 2023-11-17: qty 5

## 2023-11-17 MED ORDER — MIDAZOLAM HCL (PF) 2 MG/2ML IJ SOLN
INTRAMUSCULAR | Status: DC | PRN
  Administered 2023-11-17 (×2): .5 mg via INTRAVENOUS

## 2023-11-17 MED ORDER — OXYMETAZOLINE HCL 0.05 % NA SOLN
NASAL | Status: DC | PRN
  Administered 2023-11-17: 1 via TOPICAL

## 2023-11-17 MED ORDER — LIDOCAINE-EPINEPHRINE 1 %-1:100000 IJ SOLN
INTRAMUSCULAR | Status: AC
  Filled 2023-11-17: qty 1

## 2023-11-17 MED ORDER — ROCURONIUM BROMIDE 10 MG/ML (PF) SYRINGE
PREFILLED_SYRINGE | INTRAVENOUS | Status: DC | PRN
  Administered 2023-11-17: 30 mg via INTRAVENOUS

## 2023-11-17 MED ORDER — DEXAMETHASONE SOD PHOSPHATE PF 10 MG/ML IJ SOLN
INTRAMUSCULAR | Status: DC | PRN
Start: 2023-11-17 — End: 2023-11-17
  Administered 2023-11-17: 10 mg via INTRAVENOUS

## 2023-11-17 MED ORDER — POLYETHYLENE GLYCOL 3350 17 G PO PACK: 17.0000 g | PACK | Freq: Every day | ORAL | Status: DC | PRN

## 2023-11-17 MED ORDER — PROPOFOL 10 MG/ML IV BOLUS
INTRAVENOUS | Status: AC
  Filled 2023-11-17: qty 20

## 2023-11-17 MED ORDER — OXYMETAZOLINE HCL 0.05 % NA SOLN
NASAL | Status: AC
  Filled 2023-11-17: qty 30

## 2023-11-17 MED ORDER — MIDAZOLAM HCL 2 MG/2ML IJ SOLN
INTRAMUSCULAR | Status: AC
Start: 2023-11-17 — End: 2023-11-17
  Filled 2023-11-17: qty 2

## 2023-11-17 MED ORDER — DOCUSATE SODIUM 100 MG PO CAPS: 100.0000 mg | ORAL_CAPSULE | Freq: Two times a day (BID) | ORAL | Status: DC | PRN

## 2023-11-17 MED ORDER — PHENYLEPHRINE HCL (PRESSORS) 10 MG/ML IV SOLN
INTRAVENOUS | Status: AC
  Filled 2023-11-17: qty 1

## 2023-11-17 MED ORDER — LIDOCAINE 2% (20 MG/ML) 5 ML SYRINGE
INTRAMUSCULAR | Status: DC | PRN
  Administered 2023-11-17 (×2): 20 mg via INTRAVENOUS

## 2023-11-17 MED ORDER — PROPOFOL 10 MG/ML IV BOLUS
INTRAVENOUS | Status: DC | PRN
  Administered 2023-11-17: 80 mg via INTRAVENOUS
  Administered 2023-11-17: 20 mg via INTRAVENOUS

## 2023-11-17 MED ORDER — PHENYLEPHRINE 80 MCG/ML (10ML) SYRINGE FOR IV PUSH (FOR BLOOD PRESSURE SUPPORT)
PREFILLED_SYRINGE | INTRAVENOUS | Status: DC | PRN
Start: 2023-11-17 — End: 2023-11-17
  Administered 2023-11-17: 160 ug via INTRAVENOUS
  Administered 2023-11-17: 240 ug via INTRAVENOUS
  Administered 2023-11-17: 160 ug via INTRAVENOUS
  Administered 2023-11-17: 80 ug via INTRAVENOUS
  Administered 2023-11-17: 160 ug via INTRAVENOUS

## 2023-11-17 MED ORDER — FENTANYL CITRATE (PF) 50 MCG/ML IJ SOSY
PREFILLED_SYRINGE | INTRAMUSCULAR | Status: AC
  Filled 2023-11-17: qty 1

## 2023-11-17 MED ORDER — FENTANYL CITRATE (PF) 250 MCG/5ML IJ SOLN
INTRAMUSCULAR | Status: DC | PRN
  Administered 2023-11-17: 25 ug via INTRAVENOUS

## 2023-11-17 MED ORDER — EPHEDRINE 5 MG/ML INJ
INTRAVENOUS | Status: AC
Start: 2023-11-17 — End: 2023-11-17
  Filled 2023-11-17: qty 5

## 2023-11-17 MED ORDER — CHLORHEXIDINE GLUCONATE 0.12 % MT SOLN
15.0000 mL | OROMUCOSAL | Status: AC
  Administered 2023-11-17: 15 mL via OROMUCOSAL
  Filled 2023-11-17: qty 15

## 2023-11-17 MED ORDER — PROPOFOL 10 MG/ML IV BOLUS
INTRAVENOUS | Status: AC
Start: 2023-11-17 — End: 2023-11-17
  Filled 2023-11-17: qty 20

## 2023-11-17 MED ORDER — ONDANSETRON HCL 4 MG/2ML IJ SOLN
INTRAMUSCULAR | Status: AC
  Filled 2023-11-17: qty 2

## 2023-11-17 MED ORDER — EPINEPHRINE HCL (NASAL) 0.1 % NA SOLN
NASAL | Status: AC
  Filled 2023-11-17: qty 30

## 2023-11-17 MED ORDER — ROCURONIUM BROMIDE 10 MG/ML (PF) SYRINGE
PREFILLED_SYRINGE | INTRAVENOUS | Status: AC
  Filled 2023-11-17: qty 10

## 2023-11-17 MED ORDER — KETAMINE HCL 50 MG/5ML IJ SOSY
PREFILLED_SYRINGE | INTRAMUSCULAR | Status: AC
  Filled 2023-11-17: qty 5

## 2023-11-17 MED ORDER — ONDANSETRON HCL 4 MG/2ML IJ SOLN
INTRAMUSCULAR | Status: DC | PRN
  Administered 2023-11-17: 4 mg via INTRAVENOUS

## 2023-11-17 MED ORDER — FENTANYL CITRATE (PF) 250 MCG/5ML IJ SOLN
INTRAMUSCULAR | Status: AC
  Filled 2023-11-17: qty 5

## 2023-11-17 MED ORDER — DEXMEDETOMIDINE HCL IN NACL 80 MCG/20ML IV SOLN
INTRAVENOUS | Status: DC | PRN
Start: 2023-11-17 — End: 2023-11-17
  Administered 2023-11-17: 8 ug via INTRAVENOUS
  Administered 2023-11-17 (×4): 4 ug via INTRAVENOUS

## 2023-11-17 MED ORDER — LACTATED RINGERS IV SOLN: INTRAVENOUS | Status: DC

## 2023-11-17 MED ORDER — SUGAMMADEX SODIUM 200 MG/2ML IV SOLN
INTRAVENOUS | Status: DC | PRN
  Administered 2023-11-17: 100 mg via INTRAVENOUS

## 2023-11-17 MED ORDER — DEXTROSE IN LACTATED RINGERS 5 % IV SOLN: INTRAVENOUS | Status: AC

## 2023-11-17 MED ORDER — LIDOCAINE-EPINEPHRINE 1 %-1:100000 IJ SOLN
INTRAMUSCULAR | Status: DC | PRN
  Administered 2023-11-17: 8 mL

## 2023-11-17 MED ORDER — FENTANYL CITRATE (PF) 50 MCG/ML IJ SOSY
25.0000 ug | PREFILLED_SYRINGE | INTRAMUSCULAR | Status: DC | PRN
Start: 2023-11-17 — End: 2023-11-27
  Administered 2023-11-17 – 2023-11-22 (×19): 25 ug via INTRAVENOUS
  Filled 2023-11-17 (×18): qty 1

## 2023-11-17 MED ORDER — CHLORHEXIDINE GLUCONATE CLOTH 2 % EX PADS
6.0000 | MEDICATED_PAD | Freq: Every day | CUTANEOUS | Status: DC
  Administered 2023-11-17 – 2023-11-27 (×11): 6 via TOPICAL

## 2023-11-17 MED ORDER — 0.9 % SODIUM CHLORIDE (POUR BTL) OPTIME
TOPICAL | Status: DC | PRN
  Administered 2023-11-17: 1000 mL

## 2023-11-17 MED ORDER — PHENYLEPHRINE HCL-NACL 20-0.9 MG/250ML-% IV SOLN
INTRAVENOUS | Status: DC | PRN
  Administered 2023-11-17: 30 ug/min via INTRAVENOUS

## 2023-11-17 MED ORDER — GLYCOPYRROLATE PF 0.2 MG/ML IJ SOSY
PREFILLED_SYRINGE | INTRAMUSCULAR | Status: DC | PRN
Start: 2023-11-17 — End: 2023-11-17
  Administered 2023-11-17: .2 mg via INTRAVENOUS

## 2023-11-17 SURGICAL SUPPLY — 36 items
BAG COUNTER SPONGE SURGICOUNT (BAG) ×2 IMPLANT
BALLN PULMONARY 10-12 (MISCELLANEOUS) IMPLANT
BALLOON PULM 12 13.5 15X75 (BALLOONS) IMPLANT
BLADE SURG 15 STRL LF DISP TIS (BLADE) IMPLANT
BNDG EYE OVAL 2 1/8 X 2 5/8 (GAUZE/BANDAGES/DRESSINGS) ×4 IMPLANT
CANISTER SUCTION 3000ML PPV (SUCTIONS) ×2 IMPLANT
CATH ROBINSON RED A/P 12FR (CATHETERS) IMPLANT
CNTNR URN SCR LID CUP LEK RST (MISCELLANEOUS) IMPLANT
COVER BACK TABLE 60X90IN (DRAPES) ×2 IMPLANT
COVER MAYO STAND STRL (DRAPES) ×2 IMPLANT
DRAPE HALF SHEET 40X57 (DRAPES) ×2 IMPLANT
DRSG MEPILEX BORD LITE 2X5 (GAUZE/BANDAGES/DRESSINGS) IMPLANT
GAUZE SPONGE 4X4 12PLY STRL (GAUZE/BANDAGES/DRESSINGS) ×2 IMPLANT
GLOVE BIO SURGEON STRL SZ 6.5 (GLOVE) ×2 IMPLANT
GOWN STRL REUS W/ TWL LRG LVL3 (GOWN DISPOSABLE) IMPLANT
KIT BASIN OR (CUSTOM PROCEDURE TRAY) ×2 IMPLANT
KIT TURNOVER KIT B (KITS) ×2 IMPLANT
NDL HYPO 25GX1X1/2 BEV (NEEDLE) IMPLANT
NEEDLE HYPO 25GX1X1/2 BEV (NEEDLE) ×2 IMPLANT
PAD ARMBOARD POSITIONER FOAM (MISCELLANEOUS) ×4 IMPLANT
PATTIES SURGICAL .5X1.5 (GAUZE/BANDAGES/DRESSINGS) ×2 IMPLANT
PENCIL BUTTON HOLSTER BLD 10FT (ELECTRODE) IMPLANT
POSITIONER HEAD DONUT 9IN (MISCELLANEOUS) IMPLANT
SET COLLECT BLD 25X3/4 12 (NEEDLE) ×2 IMPLANT
SOLN 0.9% NACL POUR BTL 1000ML (IV SOLUTION) ×2 IMPLANT
SOLN STERILE WATER BTL 1000 ML (IV SOLUTION) ×2 IMPLANT
SOLUTION ANTFG W/FOAM PAD STRL (MISCELLANEOUS) ×2 IMPLANT
SPONGE DRAIN TRACH 4X4 STRL 2S (GAUZE/BANDAGES/DRESSINGS) IMPLANT
SURGILUBE 2OZ TUBE FLIPTOP (MISCELLANEOUS) IMPLANT
SUT SILK 2 0 PERMA HAND 18 BK (SUTURE) IMPLANT
SUT SILK 2 0 SH CR/8 (SUTURE) IMPLANT
SUT VIC AB 3-0 SH 8-18 (SUTURE) IMPLANT
SYR TB 1ML LUER SLIP (SYRINGE) ×2 IMPLANT
TOWEL GREEN STERILE FF (TOWEL DISPOSABLE) ×2 IMPLANT
TUBE CONNECTING 12X1/4 (SUCTIONS) ×2 IMPLANT
TUBE TRACH 6.0 CUFF FLEX (MISCELLANEOUS) IMPLANT

## 2023-11-17 NOTE — H&P (Signed)
 Pre-Operative H&P - Day Of Surgery Patient Name: Jerome Washington [facility] Date:   11/17/2023  HPI: Jerome Washington is a 53 y.o. male who presents today for operative treatment of oropharynx mass. Patient denies recent significant changes to health or significant new medications or physiologic change in condition which would immediately impact plans. No new types of therapy has been initiated that would change the plan or the appropriateness of the plan.  Feels like he's breathing better after steroids  ROS:  A complete review of systems was obtained and is otherwise negative.   PMH:  Past Medical History:  Diagnosis Date   Cancer (HCC)    mouth   Chronic back pain    Hypertension    Myocardial infarction (HCC)    Sclerosis, hereditary spinal (HCC)     PSH:  Past Surgical History:  Procedure Laterality Date   NECK SURGERY     mouth cancer   TRACHEOSTOMY      MEDS:   Current Facility-Administered Medications:    lactated ringers infusion, , Intravenous, Continuous, Treen, Jon R, MD, Last Rate: 10 mL/hr at 11/17/23 1531, Continued from Pre-op at 11/17/23 1531  ALLERGIES: Bee venom, Morphine  and codeine , and Penicillins  EXAM: Vitals: BP (!) 133/94   Pulse 82   Temp 98.8 F (37.1 C) (Oral)   Resp 18   Ht 5' 11 (1.803 m)   Wt 47.2 kg   SpO2 98%   BMI 14.51 kg/m   General Awake, at baseline alertness.   HEENT No scleral icterus or conjunctival hemorrhage. Globe position appears normal. External ears  normal. Nose patent without rhinorrhea. No lymphadenopathy. No thyromegaly  Cardiovascular No cyanosis.  Pulmonary No audible stridor. Breathing easily when flat, gurgly voice  Neuro Symmetric facial movement.   Psychiatry Appropriate affect and mood.  Skin Neck scar well healed  Extermities Moves all extremities with normal range of motion.   Other Findings None.   Assessment & Plan: Jerome Washington has diagnoses of oropharynx mass and will go to the OR today for tracheostomy, direct  laryngoscopy with biopsy. Informed consent was obtained and available in EMR today. All questions have been answered, and risks/benefits/alternatives of procedure as noted in the consent were discussed in a quiet area. Questions were invited and answered. The patient expressed understanding, provided consent and wished to proceed despite risks.  Jerome Washington 11/17/2023 4:14 PM

## 2023-11-17 NOTE — Op Note (Addendum)
 Otolaryngology Operative note  Jerome Washington Date/Time of Admission: 11/17/2023  1:28 PM  CSN: [id number];FMW:[id number]  DOB: 1970/07/01 Age: 53 y.o. Location: [facility] OR    Pre-Op Diagnosis: Oropharynx Mass  Post-Op Diagnosis: Same  Procedure: Tracheostomy with Bjork Flap (CPT 31600) Suspension Microdirect Laryngoscopy with Biopsy with use of Operating Telescope (CPT [phone])  Surgeon: Jerome Blanch, MD  Anesthesia type:  Awake tracheostomy, then GETA  Anesthesiologist: Anesthesiologist: Jerome Thom SAUNDERS, MD CRNA: Jerome Arland HERO, CRNA   Staff: Circulator: Welford Stevenson NOVAK, RN Relief Scrub: Gwenn Mardy SQUIBB, RN Scrub Person: Lenon Keven CROME, RN RN First Assistant: Sherrine Moats, RN  Implants: * No implants in log *  Specimens: ID Type Source Tests Collected by Time Destination  1 : Oropharynx Tissue PATH ENT biopsy SURGICAL PATHOLOGY [place] Jerome NOVAK, MD 11/17/2023 1717   2 : Oropharynx Tissue PATH ENT biopsy SURGICAL PATHOLOGY [place] Jerome NOVAK, MD 11/17/2023 1721     EBL: 30cc  Drains: None  Post-op disposition and condition: PACU, hemodynamically stable  Findings: Straightfoward 6 cuffed tracheostomy placed; bjork flap performed; Large oropharynx mass almost 270 degrees; tongue base did not appear involved but difficult to see entirety given bulk of mass. Distal airway including epiglottis and larynx not involved.  Complications: None apparent  Indications and consent:  Jerome Washington is a 53 y.o. male with history of oral cavity cancer s/p resection now with a large oropharynx mass. He is in respiratory distress. The patient's options were discussed, including risks/benefits/alternatives for each option. Patient expressed understanding, and despite these risks, consented and decided to proceed with above procedures. Informed consent was signed before proceeding.  Procedure: After discussing risks, benefits, and alternatives, the patient was brought to the  operative suite and placed on the operative table in the supine position. The anterior neck was prepped and draped in sterile fashion. With the patient awake, neck landmarks were palpated and tracheostomy incision was marked between the thyroid notch and sternal notch. The incision site and the planned tracheostomy tract was injected with local anesthetic (1% Lidocaine  with 1:100000 epinephrine).  The incision was made using a 15 blade scalpel. Dissection proceeded deeper, staying midline, and the strap muscles were identified and retracted laterally. The anterior tracheal wall was identified and soft tissues was swept from the trachea.  The space between the second and third tracheal rings was palpated and a horizontal incision was made in this space with the scalpel and extended to either side with scissors. Two vertical cuts were also made to create a Bjork flap which was secured to the anterior neck skin using two half horizontal mattress 3-0 vicryl sutures.  A 2-0 silk superior stay suture was placed around the ring above the trach site.  A 6-0 trach was placed and the inner cannula was inserted.  The anesthesia circuit was attached and the patient was successfully ventilated. A mepilex dressing was placed and the dressing and the trach flange were secured to the neck skin using 2-0 silk in four quadrants.    After establishment of airway, general anesthesia was induced. The bed was rotated 90 degrees. The anterior commissure laryngoscope was then used to examine the oral cavity, oropharynx, and hypopharynx and larynx with findings above. The operating telescope (0 degree) was used to examine the areas described above. The oropharynx was extremely narrow because of the tumor. The laryngoscope was then removed and the Crowe-Davis mouth gag was inserted. Several biopsies of the mass were then obtained using cup forceps. Hemostasis was  achieved with suction cautery and afrin pledgets. The hypopharynx and  oropharynx were suctioned. All instruments were removed.  A trach collar was applied. The skin was cleaned off, and care was then returned to anesthesia and patient transported to PACU I was present and participated through the entirety of the procedure.   Plan: - Some bleeding from oropharynx expected - Patient is not able to be intubated from above. - Ok for clear liquids and (if tolerates), liquid meds tonight as tolerated - Can take cuff down while drinking but then please inflate cuff - The dressing is secured to the trach and the skin - can change as needed - Ok for remainder of care by ICU - Will need G-tube - ENT will follow, please page with questions   Jerome [place]

## 2023-11-17 NOTE — Transfer of Care (Signed)
 Immediate Anesthesia Transfer of Care Note  Patient: Jerome Washington  Procedure(s) Performed: CREATION, TRACHEOSTOMY (Neck) DIRECT LARYNOSCOPY WITH BIOPSY (Throat)  Patient Location: ICU  Anesthesia Type:General  Level of Consciousness: awake, alert , and patient cooperative  Airway & Oxygen Therapy: Patient Spontanous Breathing and Patient connected to tracheostomy mask oxygen  Post-op Assessment: Report given to RN and Post -op Vital signs reviewed and stable  Post vital signs: Reviewed and stable  Last Vitals:  Vitals Value Taken Time  BP 117/92 11/17/23 18:14  Temp 36.6 C 11/17/23 18:14  Pulse 107 11/17/23 18:14  Resp 12 11/17/23 18:14  SpO2 100 % 11/17/23 18:13    Last Pain:  Vitals:   11/17/23 1814  TempSrc: Axillary  PainSc: 0-No pain      Patients Stated Pain Goal: 0 (11/17/23 1427)  Complications: No notable events documented.

## 2023-11-17 NOTE — Consult Note (Signed)
   NAME:  Jerome Washington, MRN:  [id number], DOB:  05/24/1970, LOS: 0 ADMISSION DATE:  11/17/2023, CONSULTATION DATE: 11/17/2023 REFERRING MD: Eldora Blanch MD , CHIEF COMPLAINT:  Oropharyngeal mass. tracheostomy   History of Present Illness:  53 year old with h/o oral cavity SCCa s/p resection, hypertension, GERD, coronary artery disease.  Evaluated by ENT for throat pain with CT findings of oropharyngeal mass Admitted for urgent tracheostomy and biopsy by ENT PCCM consulted for help with postop management and observation in ICU  Pertinent  Medical History    has a past medical history of Cancer [facility]), Chronic back pain, Hypertension, Myocardial infarction (HCC), and Sclerosis, hereditary spinal (HCC).   Significant Hospital Events: Including procedures, antibiotic start and stop dates in addition to other pertinent events     Interim History / Subjective:    Objective    Blood pressure (!) 133/94, pulse (!) 104, temperature 98.8 F (37.1 C), temperature source Oral, resp. rate 18, height 5' 11 (1.803 m), weight 47.2 kg, SpO2 100%.    FiO2 (%):  [40 %] 40 %   Intake/Output Summary (Last 24 hours) at 11/17/2023 1818 Last data filed at 11/17/2023 1744 Gross per 24 hour  Intake --  Output 15 ml  Net -15 ml   Filed Weights   11/16/23 1528 11/17/23 1347  Weight: 47.2 kg 47.2 kg    Examination: Gen:      No acute distress HEENT:  EOMI, sclera anicteric Neck:     No masses; no thyromegaly, tracheostomy in place Lungs:    Clear to auscultation bilaterally; normal respiratory effort CV:         Regular rate and rhythm; no murmurs Abd:      + bowel sounds; soft, non-tender; no palpable masses, no distension Ext:    No edema; adequate peripheral perfusion Neuro: alert and oriented x 3 Psych: normal mood and affect   Labs/imaging reviewed Significant for BUN/creatinine 8/0.47 AST 22, ALT 6 WBC 11.4, hemoglobin 14.6, platelets 379  CT neck on 10/26 with 6.3 cm mass in the  posterior oropharynx concerning for recurrent oropharyngeal cancer  Resolved problem list   Assessment and Plan  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   Critical Care Time devoted to patient care services, exclusive of separately billable procedures, described in this note is 35 minutes.   Shakeda Pearse MD [facility] See Amion for pager  If no response to pager , please call [phone] until 7pm After 7:00 pm call Elink  [phone] 11/17/2023, 6:35 PM

## 2023-11-18 ENCOUNTER — Inpatient Hospital Stay (HOSPITAL_COMMUNITY)

## 2023-11-18 ENCOUNTER — Encounter (HOSPITAL_COMMUNITY): Payer: Self-pay | Admitting: Otolaryngology

## 2023-11-18 DIAGNOSIS — E43 Unspecified severe protein-calorie malnutrition: Secondary | ICD-10-CM | POA: Insufficient documentation

## 2023-11-18 DIAGNOSIS — Z930 Tracheostomy status: Secondary | ICD-10-CM | POA: Diagnosis not present

## 2023-11-18 LAB — GLUCOSE, CAPILLARY
Glucose-Capillary: 105 mg/dL — ABNORMAL HIGH (ref 70–99)
Glucose-Capillary: 139 mg/dL — ABNORMAL HIGH (ref 70–99)
Glucose-Capillary: 142 mg/dL — ABNORMAL HIGH (ref 70–99)

## 2023-11-18 LAB — HIV ANTIBODY (ROUTINE TESTING W REFLEX): HIV Screen 4th Generation wRfx: NONREACTIVE

## 2023-11-18 MED ORDER — ORAL CARE MOUTH RINSE
15.0000 mL | OROMUCOSAL | Status: DC
  Administered 2023-11-18 – 2023-11-27 (×35): 15 mL via OROMUCOSAL

## 2023-11-18 MED ORDER — HEPARIN SODIUM (PORCINE) 5000 UNIT/ML IJ SOLN
5000.0000 [IU] | Freq: Two times a day (BID) | INTRAMUSCULAR | Status: DC
  Administered 2023-11-18 – 2023-11-27 (×19): 5000 [IU] via SUBCUTANEOUS
  Filled 2023-11-18 (×18): qty 1

## 2023-11-18 MED ORDER — BOOST / RESOURCE BREEZE PO LIQD CUSTOM
1.0000 | Freq: Three times a day (TID) | ORAL | Status: DC
  Administered 2023-11-19: 1 via ORAL

## 2023-11-18 MED ORDER — CHLORHEXIDINE GLUCONATE 0.12 % MT SOLN: 15.0000 mL | Freq: Once | OROMUCOSAL | Status: DC

## 2023-11-18 MED ORDER — MELATONIN 3 MG PO TABS: 3.0000 mg | ORAL_TABLET | Freq: Every day | ORAL | Status: DC

## 2023-11-18 MED ORDER — CEFAZOLIN SODIUM-DEXTROSE 2-4 GM/100ML-% IV SOLN
2.0000 g | INTRAVENOUS | Status: AC
  Administered 2023-11-19: 2 g via INTRAVENOUS
  Filled 2023-11-18: qty 100

## 2023-11-18 MED ORDER — ORAL CARE MOUTH RINSE: 15.0000 mL | OROMUCOSAL | Status: DC | PRN

## 2023-11-18 NOTE — Plan of Care (Signed)

## 2023-11-18 NOTE — Progress Notes (Signed)
 Discussed patient with ENT. Needs G tube, ambulation.  BP 103/72   Pulse 72   Temp (!) 97.1 F (36.2 C) (Axillary)   Resp 19   Ht 5' 11 (1.803 m)   Wt 47.2 kg   SpO2 100%   BMI 14.51 kg/m  Trach in place, no bleeding Coughing up blood into suction tube Awake, alert, no respiratory distress. Trying to talk.    Plan: IR consulted for Gtube placement PT, OT, SLP consulted Needs to walk-- d/w RN ENT to change to cuffless trach in the coming days.  Stable to transfer out of ICU. TRH assuming care this morning.  Jerome SHAUNNA Gaskins, DO 11/18/23 9:58 AM [facility]  For contact information, see Amion. If no response to pager, please call PCCM consult pager. After hours, 7PM- 7AM, please call Elink.

## 2023-11-18 NOTE — Anesthesia Postprocedure Evaluation (Signed)
 Anesthesia Post Note  Patient: Jerome Washington  Procedure(s) Performed: CREATION, TRACHEOSTOMY (Neck) DIRECT LARYNOSCOPY WITH BIOPSY (Throat)     Patient location during evaluation: SICU Anesthesia Type: General Level of consciousness: awake Pain management: pain level controlled Vital Signs Assessment: post-procedure vital signs reviewed and stable Respiratory status: spontaneous breathing and patient connected to tracheostomy mask oxygen Cardiovascular status: stable Postop Assessment: no apparent nausea or vomiting Anesthetic complications: no   No notable events documented.  Last Vitals:  Vitals:   11/18/23 1607 11/18/23 2017  BP:  (!) 128/99  Pulse:  75  Resp:  17  Temp: 37.1 C 36.7 C  SpO2:  93%    Last Pain:  Vitals:   11/18/23 2017  TempSrc: Oral  PainSc:                  Jerome Washington

## 2023-11-18 NOTE — Plan of Care (Signed)

## 2023-11-18 NOTE — Progress Notes (Signed)
 S: NAEON, no fevers. Breathing well. Eager to have G-tube O: On trach mask -- 35%; neck soft, flat, no oral bleeding; 6 cuffed trach in place, cuff taken down Vitals:   11/18/23 0716 11/18/23 0800  BP:  108/65  Pulse: 98 (!) 104  Resp: 20   Temp: (!) 97.1 F (36.2 C)   SpO2: 100%    A/P: 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 now with significant oropharynx mass s/p awake trach and DL/Bx 89/71/7974  - Some bleeding from oropharynx expected - Patient is not able to be intubated from above. 6 cuffed trach in place - cuff down today; consult Speech for possible HME - Recommend G-tube given significant cachexia - Ok for clear liquids (if tolerates) - Can change as needed - Ok  for floor status - He will need 4 cuffless trach supplies and G-tube supplies; anticipate trach change to cuffless day after G-tube placement. Cannot get PEG tube due to airway; will need IR guided or Lap G  Page ENT with questions  [name]

## 2023-11-18 NOTE — Consult Note (Signed)
 Chief Complaint: Dysphagia. Request is for G tube placement   Referring Provider(s): Dr. Leita Gaskins   Supervising Physician: Dr. ONEIDA Specking  Patient Status: [facility] - In-pt  History of Present Illness: Jerome Washington is a 53 y.o. male History of  HTN, GERD, MI. 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 ,Presented to ED at [facility] on 10.26.25 with throat pain X 1 week and difficulty swallowing.  Found to have an oropharyngeal mass. S/p mass biopsy and  tracheostomy placement and on 10.28.25. Team is requesting a g tube placement for nutritional access.  Case reviewed by IR Attending Dr. Frederic Specking who approves anatomy for push in G tube placement.   Today patient reports some soreness around his trach site, no other complaints.    Patient is Full Code  Past Medical History:  Diagnosis Date   Cancer (HCC)    mouth   Chronic back pain    Hypertension    Myocardial infarction (HCC)    Sclerosis, hereditary spinal (HCC)     Past Surgical History:  Procedure Laterality Date   MICROLARYNGOSCOPY WITH LASER N/A 11/17/2023   Procedure: DIRECT LARYNOSCOPY WITH BIOPSY;  Surgeon: Tobie Eldora NOVAK, MD;  Location: [facility] OR;  Service: ENT;  Laterality: N/A;   NECK SURGERY     mouth cancer   TRACHEOSTOMY     TRACHEOSTOMY TUBE PLACEMENT N/A 11/17/2023   Procedure: CREATION, TRACHEOSTOMY;  Surgeon: Tobie Eldora NOVAK, MD;  Location: [facility] OR;  Service: ENT;  Laterality: N/A;  AWAKE Tracheostomy v/s Fiberoptic intubation, direct laryngoscopy with Biopsy    Allergies: Bee venom, Morphine  and codeine , and Penicillins  Medications: Prior to Admission medications   Medication Sig Start Date End Date Taking? Authorizing Provider  chlordiazePOXIDE  (LIBRIUM ) 25 MG capsule 50mg  PO TID x 1D, then 25-50mg  PO BID X 1D, then 25-50mg  PO QD X 1D Patient taking differently: Take 25 mg by mouth daily. 50mg  PO TID x 1D, then 25-50mg  PO BID X 1D, then 25-50mg  PO QD X 1D  01/21/18  Yes Mesner, Jason, MD  fluticasone (FLONASE) 50 MCG/ACT nasal spray Place 1 spray into both nostrils 2 (two) times daily as needed for allergies.   Yes [provider]  gabapentin (NEURONTIN) 300 MG capsule Take 300 mg by mouth 3 (three) times daily. 06/17/23  Yes [provider]  ibuprofen  (ADVIL ) 800 MG tablet Take 800 mg by mouth every 6 (six) hours as needed.   Yes [provider]  loratadine (CLARITIN) 10 MG tablet Take 10 mg by mouth at bedtime.   Yes [provider]  predniSONE  (DELTASONE ) 20 MG tablet Take 1 tablet (20 mg total) by mouth daily with breakfast for 5 days. 11/16/23 11/21/23 Yes Tobie Eldora B, MD  propranolol (INDERAL) 10 MG tablet Take 10 mg by mouth 2 (two) times daily. 06/17/23  Yes [provider]  traZODone (DESYREL) 50 MG tablet Take 50 mg by mouth at bedtime. 08/04/23  Yes [provider]  carbamide peroxide (DEBROX) 6.5 % OTIC solution INSTILL 5 DROPS INTO AFFECTED EAR(S) BY OTIC ROUTE 2 TIMES PER DAY Patient not taking: Reported on 11/18/2023 10/13/23   [provider]  Menthol , Topical Analgesic, (BIOFREEZE) 4 % GEL Apply 1 application topically 2 (two) times daily as needed. Patient not taking: Reported on 11/18/2023 05/26/17   Mesner, Selinda, MD  methocarbamol  (ROBAXIN ) 500 MG tablet Take 1 tablet (500 mg total) by mouth 2 (two) times daily as  needed for muscle spasms. Patient not taking: Reported on 11/18/2023 04/29/23   Cleotilde Rogue, MD  methocarbamol  (ROBAXIN ) 500 MG tablet Take 500 mg by mouth 3 (three) times daily as needed. Patient not taking: Reported on 11/18/2023    [provider]  methylPREDNISolone  (MEDROL  DOSEPAK) 4 MG TBPK tablet Taper ovdr 6 days Patient not taking: Reported on 11/18/2023 04/29/23   Cleotilde Rogue, MD     History reviewed. No pertinent family history.  Social History   Socioeconomic History   Marital status: Divorced    Spouse name: Not on file   Number of  children: Not on file   Years of education: Not on file   Highest education level: Not on file  Occupational History   Not on file  Tobacco Use   Smoking status: Every Day    Current packs/day: 0.50    Types: Cigarettes   Smokeless tobacco: Never  Vaping Use   Vaping status: Never Used  Substance and Sexual Activity   Alcohol use: Yes    Comment: daily 6 pack   Drug use: No   Sexual activity: Not Currently  Other Topics Concern   Not on file  Social History Narrative   Not on file   Social Drivers of Health   Financial Resource Strain: Not on file  Food Insecurity: No Food Insecurity (11/18/2023)   Hunger Vital Sign    Worried About Running Out of Food in the Last Year: Never true    Ran Out of Food in the Last Year: Never true  Transportation Needs: No Transportation Needs (11/18/2023)   PRAPARE - Administrator, Civil Service (Medical): No    Lack of Transportation (Non-Medical): No  Physical Activity: Not on file  Stress: Not on file  Social Connections: Not on file     Review of Systems: A 12 point ROS discussed and pertinent positives are indicated in the HPI above.  All other systems are negative.   Vital Signs: BP 124/74   Pulse 87   Temp 99.3 F (37.4 C) (Oral)   Resp 20   Ht 5' 11 (1.803 m)   Wt 104 lb (47.2 kg)   SpO2 96%   BMI 14.51 kg/m   Advance Care Plan: No documents on file  Physical Exam Vitals and nursing note reviewed.  Constitutional:      General: He is not in acute distress. HENT:     Mouth/Throat:     Mouth: Mucous membranes are moist.     Comments: + trach Cardiovascular:     Rate and Rhythm: Normal rate and regular rhythm.  Pulmonary:     Effort: Pulmonary effort is normal.     Breath sounds: Normal breath sounds.  Abdominal:     Palpations: Abdomen is soft.     Tenderness: There is no abdominal tenderness.  Musculoskeletal:     Right lower leg: No edema.     Left lower leg: No edema.  Skin:    General:  Skin is warm and dry.  Neurological:     Mental Status: He is alert and oriented to person, place, and time. Mental status is at baseline.     Imaging: CT ABDOMEN WO CONTRAST Result Date: 11/18/2023 CLINICAL DATA:  53 year old male, preprocedure planning for percutaneous gastrostomy tube placement. EXAM: CT ABDOMEN WITHOUT CONTRAST TECHNIQUE: Multidetector CT imaging of the abdomen was performed following the standard protocol without IV contrast. RADIATION DOSE REDUCTION: This exam was performed according to the departmental  dose-optimization program which includes automated exposure control, adjustment of the mA and/or kV according to patient size and/or use of iterative reconstruction technique. COMPARISON:  08/17/2022 FINDINGS: Lower chest: No acute abnormality. Hepatobiliary: No focal liver abnormality is seen. No gallstones, gallbladder wall thickening, or biliary dilatation. Pancreas: Unremarkable. No pancreatic ductal dilatation or surrounding inflammatory changes. Spleen: Normal in size without focal abnormality. Adrenals/Urinary Tract: Adrenal glands are unremarkable. Punctate left inferior pole nonobstructive renal calculus. The kidneys are otherwise normal. Stomach/Bowel: Stomach is within normal limits. Appendix appears normal. No evidence of bowel wall thickening, distention, or inflammatory changes. Vascular/Lymphatic: Aortic atherosclerosis. No enlarged abdominal lymph nodes. Other: No abdominal wall hernia or abnormality. Musculoskeletal: No acute or significant osseous findings. IMPRESSION: 1. Favorable anatomy for percutaneous gastrostomy tube placement. 2.  Aortic Atherosclerosis (ICD10-I70.0). Ester Sides, MD [facility] Radiology Electronically Signed   By: Ester Sides M.D.   On: 11/18/2023 15:04   DG CHEST PORT 1 VIEW Result Date: 11/17/2023 EXAM: 1 VIEW(S) XRAY OF THE CHEST 11/17/2023 07:09:00 PM COMPARISON: Comparison is made  with 11/15/2023. CLINICAL HISTORY: 212385 Status post tracheostomy (HCC) 212385. FINDINGS: LINES, TUBES AND DEVICES: Interval placement of tracheostomy tube tip about 3.6 cm superior to the carina. LUNGS AND PLEURA: Mild atelectasis at the left base. No focal pulmonary opacity. No pulmonary edema. No pleural effusion. No pneumothorax. HEART AND MEDIASTINUM: No acute abnormality of the cardiac and mediastinal silhouettes. BONES AND SOFT TISSUES: No acute osseous abnormality. IMPRESSION: 1. Tracheostomy tube placement as above. 2. Mild left basilar atelectasis. Electronically signed by: Luke Bun MD 11/17/2023 08:20 PM EDT RP Workstation: [id number]   CT Soft Tissue Neck W Contrast Result Date: 11/15/2023 EXAM: CT NECK WITH CONTRAST 11/15/2023 01:42:25 PM TECHNIQUE: CT of the neck was performed with the administration of 75 mL of iohexol  (OMNIPAQUE ) 300 MG/ML solution. Multiplanar reformatted images are provided for review. Automated exposure control, iterative reconstruction, and/or weight based adjustment of the mA/kV was utilized to reduce the radiation dose to as low as reasonably achievable. COMPARISON: CT neck with contrast 07/20/2019 at [facility]. The images are not currently available. CLINICAL HISTORY: Soft tissue infection suspected, neck, xray done. Pt to er, pt states that he was here a couple of weeks ago for the same, states that he was given abx for a sinus infection, but he is still having the sinus problems. FINDINGS: LIMITATIONS/ARTIFACTS: The study is mildly degraded by patient motion. AERODIGESTIVE TRACT: Status post partial glossectomy and neck dissection is noted. Diffuse mass-like mucosal thickening within the posterior oropharynx measures 6.3 x 3.2 x 4.3 cm, crossing midline. A 14 mm low density collection or lesion is present at the right glosso-tonsillar sulcus. SALIVARY GLANDS: A 4 mm calcification is present anteriorly in the right parotid gland. A subcutaneous cystic lesion  creates some mass effect on the left parotid gland. The left parotid gland is otherwise unremarkable. The submandibular glands have been resected bilaterally. THYROID: Unremarkable. LYMPH NODES: A right posterolateral retropharyngeal lymph node measures 2.3 x 1.5 cm. No other cervical adenopathy is present. SOFT TISSUES: A subcutaneous moderate density cystic lesion over the left parotid gland measures 3.2 x 2.0 x 2.8 cm. A slightly higher density subcutaneous lesion is present anteriorly in the left cheek measuring 1.7 x 1.4 cm. BRAIN, ORBITS, SINUSES AND MASTOIDS: No acute abnormality. LUNGS AND MEDIASTINUM: No acute abnormality. BONES: Chronic endplate sclerotic changes are present at C5-C6 with uncovertebral and foraminal narrowing bilaterally. An anterior osteophyte at C5 is fractured anteriorly.  IMPRESSION: 1. Diffuse mass-like mucosal thickening within the posterior oropharynx measuring 6.3 x 3.2 x 4.3 cm, crossing midline, with a 14 mm low-density collection at the right glossotonsillar sulcus. 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 Recommend ent consultation 2. Right posterolateral retropharyngeal lymph node measuring 2.3 x 1.5 cm is consistent with metastatic disease. 3. Subcutaneous cystic lesion over the left parotid gland measuring 3.2 x 2.0 x 2.8 cm, and a subcutaneous lesion in the left cheek measuring 1.7 x 1.4 cm. These are nonspecific these may represent dermoid inclusion cysts. Metastatic disease is considered less likely. Electronically signed by: Lonni Necessary MD 11/15/2023 02:04 PM EDT RP Workstation: [id number]   DG Chest Portable 1 View Result Date: 11/15/2023 EXAM: 1 VIEW(S) XRAY OF THE CHEST 11/15/2023 12:54:00 PM COMPARISON: 07/21/21 CLINICAL HISTORY: cough. Per chart: Pt to er, pt states that he was here a couple of weeks ago for the same, states that he was given abx for a sinus infection, but he is still having the sinus problems. FINDINGS:  LUNGS AND PLEURA: Pulmonary hyperinflation. No focal pulmonary opacity. No pulmonary edema. No pleural effusion. No pneumothorax. HEART AND MEDIASTINUM: No acute abnormality of the cardiac and mediastinal silhouettes. BONES AND SOFT TISSUES: No acute osseous abnormality. IMPRESSION: 1. No acute cardiopulmonary process identified. Electronically signed by: Waddell Calk MD 11/15/2023 01:02 PM EDT RP Workstation: [id number]    Labs:  CBC: Recent Labs    11/15/23 1111  WBC 11.4*  HGB 14.6  HCT 44.3  PLT 379    COAGS: No results for input(s): INR, APTT in the last 8760 hours.  BMP: Recent Labs    11/15/23 1111  NA 135  K 4.3  CL 92*  CO2 34*  GLUCOSE 85  BUN 8  CALCIUM 10.3  CREATININE 0.47*  GFRNONAA >60    LIVER FUNCTION TESTS: Recent Labs    11/15/23 1111  BILITOT 0.5  AST 22  ALT 6  ALKPHOS 121  PROT 7.4  ALBUMIN 4.0    TUMOR MARKERS: No results for input(s): AFPTM, CEA, CA199, CHROMGRNA in the last 8760 hours.  Assessment and Plan:  53 y.o. male inpatient. History of  HTN, GERD, MI. 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 ,Presented to ED at [facility] on 10.26.25 with throat pain X 1 week and difficulty swallowing.  Found to have an oropharyngeal mass. S/p mass biopsy and  tracheostomy placement and on 10.28.25. Team is requesting a g tube placement for nutritional access.  Case reviewed by IR Attending Dr. Frederic Specking who recommends a CT Abd for further evaluation of anatomy.    Plan for IR g tube tentatively for 11/19/23 - pt to be NPO at midnight  Risks and benefits discussed with the patient including, but not limited to the need for a barium enema during the procedure, bleeding, infection, peritonitis, or damage to adjacent structures. All of the patient's questions were answered, patient is agreeable to proceed. Consent signed and in chart.   Thank you for allowing our service to participate in  [facility] 's care.  Electronically Signed: Kimble VEAR Clas, PA-C   11/18/2023, 4:02 PM      I spent a total of 20 Minutes    in face to face in clinical consultation, greater than 50% of which was counseling/coordinating care for gastrostomy tube placement

## 2023-11-18 NOTE — Evaluation (Signed)
 Clinical/Bedside Swallow Evaluation Patient Details  Name: Jerome Washington MRN: [id number] Date of Birth: 1970/09/11  Today's Date: 11/18/2023 Time: SLP Start Time (ACUTE ONLY): 1608 SLP Stop Time (ACUTE ONLY): 1626 SLP Time Calculation (min) (ACUTE ONLY): 18 min  Past Medical History:  Past Medical History:  Diagnosis Date   Cancer (HCC)    mouth   Chronic back pain    Hypertension    Myocardial infarction (HCC)    Sclerosis, hereditary spinal (HCC)    Past Surgical History:  Past Surgical History:  Procedure Laterality Date   MICROLARYNGOSCOPY WITH LASER N/A 11/17/2023   Procedure: DIRECT LARYNOSCOPY WITH BIOPSY;  Surgeon: Tobie Eldora NOVAK, MD;  Location: [facility] OR;  Service: ENT;  Laterality: N/A;   NECK SURGERY     mouth cancer   TRACHEOSTOMY     TRACHEOSTOMY TUBE PLACEMENT N/A 11/17/2023   Procedure: CREATION, TRACHEOSTOMY;  Surgeon: Tobie Eldora NOVAK, MD;  Location: [facility] OR;  Service: ENT;  Laterality: N/A;  AWAKE Tracheostomy v/s Fiberoptic intubation, direct laryngoscopy with Biopsy   HPI:  Jerome Washington is a 53 y.o. male who presented 10/28 for operative treatment of oropharynx mass. 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 PO's limited to ice chips and thin liquids as per ENT who advised to advance to clear liquids if tolerating. Patient's swallow assessed at bedside with and without PMV in place. SpO2 remained at 93-96% and RR remained below 20 (with a few instances of spiking above 30 briefly). Swallow initiation appeared timely and no overt s/s aspiration observed. Patient denied any discomfort or difficulty with swallowing. SLP recommending initiate PO diet of clear liquids (thin consistency). SLP will follow for toleration and  advancement. SLP Visit Diagnosis: Dysphagia, unspecified (R13.10)    Aspiration Risk  Mild aspiration risk    Diet Recommendation Thin liquid    Liquid Administration via: Cup;Straw Medication Administration: Other (Comment) (as per MD recommendations) Supervision: Patient able to self feed Compensations: Slow rate;Small sips/bites Postural Changes: Seated upright at 90 degrees    Other  Recommendations Oral Care Recommendations: Oral care BID     Assistance Recommended at Discharge    Functional Status Assessment Patient has had a recent decline in their functional status and demonstrates the ability to make significant improvements in function in a reasonable and predictable amount of time.  Frequency and Duration min 2x/week          Prognosis Prognosis for improved oropharyngeal function: Good      Swallow Study   General Date of Onset: 11/17/23 HPI: Jerome Washington is a 53 y.o. male who presented 10/28 for operative treatment of oropharynx mass. 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 now with significant oropharynx mass s/p awake trach and DL/Bx 89/71/7974 Type of Study: Bedside Swallow Evaluation Previous Swallow Assessment: during previous hospitalization in 2020 Diet Prior to this Study: NPO Temperature Spikes Noted: No Respiratory Status: Trach Trach Size and Type: Cuff;Deflated;#6 Behavior/Cognition: Alert;Cooperative;Pleasant mood Oral Care Completed by SLP: Recent completion by staff Oral Cavity - Dentition: Missing dentition Vision: Functional for self-feeding Self-Feeding Abilities: Able to feed self Patient Positioning: Upright in bed Baseline Vocal Quality: Low vocal intensity;Hoarse;Breathy Volitional Cough: Strong Volitional Swallow: Able to elicit    Oral/Motor/Sensory Function  Ice Chips Ice chips: Within functional limits Presentation: Self Fed   Thin Liquid Thin Liquid: Within functional limits Presentation: Straw;Self Fed     Nectar Thick Nectar Thick Liquid: Not tested   Honey Thick Honey Thick Liquid: Not tested   Puree Puree: Not tested   Solid     Solid: Not tested      Norleen IVAR Blase, MA, CCC-SLP Speech Therapy

## 2023-11-18 NOTE — TOC CM/SW Note (Signed)
 Transition of Care [facility]) - Inpatient Brief Assessment   Patient Details  Name: DEGAN HANSER MRN: [id number] Date of Birth: July 28, 1970  [facility] of [facility]) CM/SW Contact:    Lauraine FORBES Saa, LCSWA Phone Number: 11/18/2023, 2:12 PM   Clinical Narrative:  2:12 PM Per chart review, patient resides at home alone. Patient has a PCP and insurance. Patient does not have SNF/HH/DME history. Patient's preferred pharmacy's are [facility], [facility], and [facility] [phone]. Per progressions, patient's family member requested hospital bed on behalf of the patient. RNCM made aware of request. TOC will continue to follow.  Transition of Care Asessment: Insurance and Status: Insurance coverage has been reviewed Patient has primary care physician: Yes Home environment has been reviewed: Private Residence Prior level of function:: N/A Prior/Current Home Services: No current home services Social Drivers of Health Review: SDOH reviewed no interventions necessary Readmission risk has been reviewed: Yes (Currently Green 11%) Transition of care needs: no transition of care needs at this time

## 2023-11-18 NOTE — Progress Notes (Signed)
 Initial Nutrition Assessment  DOCUMENTATION CODES:  Severe malnutrition in context of chronic illness, Underweight  INTERVENTION:  Monitor for timing of G-tube placement Once able to initiate tube feeds recommend: Osmolite 1.5 goal rate 47ml/hr ( per day) *initiate at 20ml and advance by 10ml q12h to goal rate Provides 1800 kcal, 75g protein and free water daily Monitor magnesium , potassium, and phosphorus daily for at least 3 days, MD to replete as needed, as pt is at risk for refeeding syndrome given severe malnutrition and inadequate oral intake Start Thiamine 100mg  daily x7 days Recommend swallow assessment as appropriate; SLP following for PMV trials and may require separate assessment Can consider transitioning to bolus tube feeds prior to discharge   NUTRITION DIAGNOSIS:  Severe Malnutrition related to chronic illness as evidenced by severe muscle depletion, severe fat depletion, percent weight loss.  GOAL:  Patient will meet greater than or equal to 90% of their needs  MONITOR:  Diet advancement, Labs, Weight trends  REASON FOR ASSESSMENT:  Other (Comment), Malnutrition Screening Tool (Low BMI)    ASSESSMENT:  Pt admitted for urgent trach placement d/t oropharynx mass. PMH significant for tobacco use, h/o oral cavity SCCa s/p resection, GERD, CAD.  10/28: admitted, OR for tracheostomy, direct laryngoscopy  with biopsy  ENT recommending PEG tube prior to discharge.  IR consulted for G-tube placement. Per RN, likely placement either today versus tomorrow.   SLP consulted for PMV trials however he would also likely benefit from swallow assessment however this was deferred today d/t tentative G-tube placement.    Spoke with pt at bedside. He is getting ready to walk with RN.  RN donned PMV however pt continues with copious secretions therefore did not speak. He was able to mouth and use hands to provide brief nutrition/weight history.   He recalls that he has  not eaten in 3 days and endorses hunger.  He states that his usual daily meal intake is 2 meals per day with snacks.  Over the last month d/t difficulty with swallowing and breathing, his intake has declined by at least 75% of his usual intake. He denies intake of protein supplements to augment oral intake.   Pt reports that his usual weight is about 135 lbs. He has noticed his weight declining but more significantly within the last month he reports a weight loss of about 25 lbs.   Limited weight history on file to review over the last year. Patients weight in April appears carried froward from July 2024 weight. Between 08/31-10/27, pt is documented to have had a weight loss of 13.2% which is clinically significant for time frame.   Medications: reviewed and include D5 in LR @ 39ml/hr  Labs:  CBG's 105-142 x24 hours  NUTRITION - FOCUSED PHYSICAL EXAM: Although pt report of declined oral intake x1 month, severity of deficits noted on exam and weight loss for time frame are suggestive of a more chronic period of undernutrition exacerbated by acute issue.  Flowsheet Row Most Recent Value  Orbital Region Severe depletion  Upper Arm Region Severe depletion  Thoracic and Lumbar Region Severe depletion  Buccal Region Severe depletion  Temple Region Severe depletion  Clavicle Bone Region Severe depletion  Clavicle and Acromion Bone Region Severe depletion  Scapular Bone Region Severe depletion  Dorsal Hand Severe depletion  Patellar Region Severe depletion  Anterior Thigh Region Severe depletion  Posterior Calf Region Severe depletion  Edema (RD Assessment) None  Hair Reviewed  Eyes Reviewed  Mouth Reviewed  Skin Reviewed  Nails Reviewed    Diet Order:   Diet Order             Diet NPO time specified  Diet effective now                   EDUCATION NEEDS:  Not appropriate for education at this time  Skin:  Skin Assessment: Skin Integrity Issues: Skin Integrity Issues::  Stage I Stage I: coccyx  Last BM:  10/28  Height:  Ht Readings from Last 1 Encounters:  11/17/23 5' 11 (1.803 m)    Weight:  Wt Readings from Last 1 Encounters:  11/17/23 47.2 kg   BMI:  Body mass index is 14.51 kg/m.  Estimated Nutritional Needs:   Kcal:  1700-1900  Protein:  75-90g  Fluid:  >/=1.7L  Royce Maris, RDN, LDN Clinical Nutrition See AMiON for contact information.

## 2023-11-18 NOTE — Evaluation (Signed)
 Passy-Muir Speaking Valve - Evaluation Patient Details  Name: RAJU COPPOLINO MRN: [id number] Date of Birth: Aug 28, 1970  Today's Date: 11/18/2023 Time: 0900-0942 SLP Time Calculation (min) (ACUTE ONLY): 42 min  Past Medical History:  Past Medical History:  Diagnosis Date   Cancer (HCC)    mouth   Chronic back pain    Hypertension    Myocardial infarction (HCC)    Sclerosis, hereditary spinal (HCC)    Past Surgical History:  Past Surgical History:  Procedure Laterality Date   NECK SURGERY     mouth cancer   TRACHEOSTOMY     HPI:  Lesslie is a 53 y.o. male who presented 10/28 for operative treatment of oropharynx mass. 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 Upon SLP arrival, patient awake, alert, and presenting with trach cuff deflated. SLP ensured full deflation and removed excess secretions from trach site via suction. Vital prior to donning PMSV are as follows: SpO2 100, HR 82, RR 21. SLP attempted to test for airway patency via digital trach occlusion, however unable to achieve full seal adequate for respiration through the upper airway. Donned PMSV with vitals remaining stable upon first attempts at respiration through the upper airway and phonation. Patient expectorated copious bloody secretions into the oral cavity and expectorated into cup throughout session. After initial bout of coughing, vocal quality clear and strong across all speech tasks and upper airway judged adequately patent. During initial expectoration of secretions, RR increased to 34 and pulse increased to 115, but otherwise remained stable and near baseline throughout 40 minute trial. Valve briefly doffed with no evidence of air trapping appreciated. While valve was donned, completed oral care via suction  toothbrush then administered ice chips per patient request. Patient tolerated with no overt difficulty. Swallow evaluation not requested/attempted as RN reports plan for potential procedure this date or next, but patient would be appropriate for swallow evaluation once medically available. Recommend use of PMSV during all interactions with staff, provided full supervision with CUFF FULLY DEFLATED. SLP will continue to follow.  SLP Visit Diagnosis: Aphonia (R49.1)    Recommendations for use/ supervision  Patient may use Passy-Muir Speech Valve: During all therapies with supervision;Intermittently with supervision PMSV Supervision: Full   SLP Assessment  Patient needs continued Speech Language Pathology Services   Assistance Recommended at Discharge PRN  Functional Status Assessment Patient has had a recent decline in their functional status and demonstrates the ability to make significant improvements in function in a reasonable and predictable amount of time.  Frequency and Duration min 2x/week  2 weeks    PMSV Trial PMSV was placed for: 40 Able to redirect subglottic air through upper airway: Yes Able to Attain Phonation: Yes Voice Quality: Normal Able to Expectorate Secretions: Yes Level of Secretion Expectoration with PMSV: Oral Breath Support for Phonation: Adequate Intelligibility: Intelligibility reduced Word: 75-100% accurate Phrase: 50-74% accurate Sentence: 50-74% accurate Conversation: 50-74% accurate Respirations During Trial:  (15-34) SpO2 During Trial: 100 % Pulse During Trial:  (82-115) Behavior: Alert;Cooperative;Expresses self well;Good eye contact   Tracheostomy Tube       Vent Dependency  FiO2 (%): 30 %    Cuff Deflation Trial Tolerated Cuff Deflation: Yes Length of Time for Cuff Deflation Trial:  (cuff deflated upon SLP arrival) Behavior: Alert;Cooperative;Expresses self well;Good eye contact  Rosina LABOR Francenia Chimenti 11/18/2023, 12:55 PM

## 2023-11-18 NOTE — TOC Initial Note (Signed)
 Transition of Care [facility]) - Initial/Assessment Note    Patient Details  Name: Jerome Washington MRN: [id number] Date of Birth: 1970-07-03  [facility]) CM/SW Contact:    Roxie KANDICE Stain, RN Phone Number: 11/18/2023, 2:19 PM  Clinical Narrative:                 Spoke to patient regarding transition needs.  Patient lives with mother. Patient requesting hospital bed. Plan for patient to get G-tube prior to discharge. Patient will need trach supplies. NCM will order trach supplies once trach size is decided. NCM will order tube feeding supplies after Dietician consult. ICM (Inpatient Care Management) will continue to follow    Expected Discharge Plan: Home/Self Care Barriers to Discharge: Continued Medical Work up   Patient Goals and CMS Choice Patient states their goals for this hospitalization and ongoing recovery are:: return home with mom          Expected Discharge Plan and Services   Discharge Planning Services: CM Consult   Living arrangements for the past 2 months: Single Family Home                                      Prior Living Arrangements/Services Living arrangements for the past 2 months: Single Family Home Lives with:: Parents (mother) Patient language and need for interpreter reviewed:: Yes Do you feel safe going back to the place where you live?: Yes      Need for Family Participation in Patient Care: Yes (Comment) Care giver support system in place?: Yes (comment)   Criminal Activity/Legal Involvement Pertinent to Current Situation/Hospitalization: No - Comment as needed  Activities of Daily Living   ADL Screening (condition at time of admission) Independently performs ADLs?: Yes (appropriate for developmental age) Is the patient deaf or have difficulty hearing?: No Does the patient have difficulty seeing, even when wearing glasses/contacts?: No Does the patient have difficulty concentrating, remembering, or making decisions?:  No  Permission Sought/Granted                  Emotional Assessment Appearance:: Appears stated age Attitude/Demeanor/Rapport: Gracious Affect (typically observed): Accepting Orientation: : Oriented to Self, Oriented to Place, Oriented to  Time, Oriented to Situation Alcohol / Substance Use: Not Applicable Psych Involvement: No (comment)  Admission diagnosis:  Mass of oropharynx [J39.2] Status post tracheostomy (HCC) [Z93.0] 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] Patient Active Problem List   Diagnosis Date Noted   Status post tracheostomy (HCC) 11/17/2023   Mass of oropharynx 11/17/2023   Respiratory distress 11/17/2023   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   Abnormal finding on evaluation procedure 07/08/2021   Degeneration of lumbar intervertebral disc 06/03/2021   Essential hypertension 06/03/2021   Gastroesophageal reflux disease without esophagitis 06/03/2021   Hardening of the aorta (main artery of the heart) 06/03/2021   Insomnia 06/03/2021   Lumbago with sciatica 06/03/2021   Other specified disorders of bone density and structure, unspecified site 06/03/2021   Polyneuropathy 06/03/2021   Spondylolisthesis 06/03/2021   Tobacco dependence due to cigarettes 06/03/2021   History of MI (myocardial infarction) 05/31/2019   Tobacco use 05/31/2019   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 [place] [address] Phone: [phone] Fax: [phone]  [facility] - [place], [place] - [address] [place] [address] Phone: [phone] Fax: [phone]     Social Drivers of Health (SDOH) Social History: SDOH Screenings   Food Insecurity: No Food Insecurity  (11/18/2023)  Housing: Low Risk  (11/18/2023)  Transportation Needs: No Transportation Needs (11/18/2023)  Utilities: Not At Risk (11/18/2023)  Tobacco Use: High Risk (11/17/2023)   SDOH Interventions:     Readmission Risk Interventions     No data to display

## 2023-11-18 NOTE — Progress Notes (Signed)
 PROGRESS NOTE    Jerome Washington  FMW:[id number] DOB: 06/01/1970 DOA: 11/17/2023 PCP: Benjamin [name], NP   Brief Narrative:  This 53 yrs old Male with PMH significant for Squamous cell carcinoma, s/p resection ,Chronic back pain, Hereditary  spinal sclerosis presented to the ED with oropharyngeal mass.  Patient was evaluated by ENT for throat pain. Patient was admitted for urgent tracheostomy and biopsy by ENT.  PCCM was consulted for help with the postoperative management and observation in the ICU.  Patient has been doing well. PCCM pickup 11/18/2023.  Plan is for IR PEG tube placement.  Assessment & Plan:   Principal Problem:   Status post tracheostomy (HCC) Active Problems:   Mass of oropharynx   Respiratory distress   Oropharyngeal cancer (HCC)  Oropharyngeal mass: Concern for recurrent squamous cell cancer. Patient underwent tracheostomy,  status post biopsy by ENT. Patient now on trach collar, wean oxygen down as tolerated. Continue routine trach care per ENT.  Moderate malnutrition: Nutritional consulted. IR consulted for G-tube placement. PT/OT/speech consulted  DVT prophylaxis:  Code Status: Full code Family Communication: No family at bed side. Disposition Plan:   Status is: Inpatient Remains inpatient appropriate because: Severity of illness.    Consultants:  ENT PCCM  Procedures: Tracheostomy /   Antimicrobials:  Anti-infectives (From admission, onward)    None       Subjective: Patient was seen and examined at bedside.  Overnight events noted. Patient is status post tracheostomy.  Eager to have a G-tube.  Objective: Vitals:   11/18/23 0716 11/18/23 0800 11/18/23 0900 11/18/23 1000  BP:  108/65 103/72 133/86  Pulse: 98 (!) 104 72 100  Resp: 20 (!) 25 19 (!) 44  Temp: (!) 97.1 F (36.2 C)     TempSrc: Axillary     SpO2: 100% 98% 100% 90%  Weight:      Height:        Intake/Output Summary (Last 24 hours) at 11/18/2023  1010 Last data filed at 11/18/2023 1000 Gross per 24 hour  Intake 1942 ml  Output 815 ml  Net 1127 ml   Filed Weights   11/16/23 1528 11/17/23 1347  Weight: 47.2 kg 47.2 kg    Examination:  General exam: Appears calm and comfortable, not in any acute distress. Trach collar noted. Respiratory system: CTA Bilaterally. Respiratory effort normal. RR 14 Cardiovascular system: S1 & S2 heard, RRR. No JVD, murmurs, rubs, gallops or clicks. Gastrointestinal system: Abdomen is non distended, soft and non tender. Normal bowel sounds heard. Central nervous system: Alert and oriented x 3. No focal neurological deficits. Extremities: No edema, No cyanosis, No clubbing Skin: No rashes, lesions or ulcers Psychiatry: Judgement and insight appear normal. Mood & affect appropriate.     Data Reviewed: I have personally reviewed following labs and imaging studies  CBC: Recent Labs  Lab 11/15/23 1111  WBC 11.4*  NEUTROABS 9.1*  HGB 14.6  HCT 44.3  MCV 103.3*  PLT 379   Basic Metabolic Panel: Recent Labs  Lab 11/15/23 1111  NA 135  K 4.3  CL 92*  CO2 34*  GLUCOSE 85  BUN 8  CREATININE 0.47*  CALCIUM 10.3   GFR: Estimated Creatinine Clearance: 73.8 mL/min (A) (by C-G formula based on SCr of 0.47 mg/dL (L)). Liver Function Tests: Recent Labs  Lab 11/15/23 1111  AST 22  ALT 6  ALKPHOS 121  BILITOT 0.5  PROT 7.4  ALBUMIN 4.0   No results for input(s): LIPASE, AMYLASE in  the last 168 hours. No results for input(s): AMMONIA in the last 168 hours. Coagulation Profile: No results for input(s): INR, PROTIME in the last 168 hours. Cardiac Enzymes: No results for input(s): CKTOTAL, CKMB, CKMBINDEX, TROPONINI in the last 168 hours. BNP (last 3 results) No results for input(s): PROBNP in the last 8760 hours. HbA1C: No results for input(s): HGBA1C in the last 72 hours. CBG: Recent Labs  Lab 11/17/23 1825 11/18/23 0012 11/18/23 0356  GLUCAP 105* 139*  142*   Lipid Profile: No results for input(s): CHOL, HDL, LDLCALC, TRIG, CHOLHDL, LDLDIRECT in the last 72 hours. Thyroid Function Tests: No results for input(s): TSH, T4TOTAL, FREET4, T3FREE, THYROIDAB in the last 72 hours. Anemia Panel: No results for input(s): VITAMINB12, FOLATE, FERRITIN, TIBC, IRON, RETICCTPCT in the last 72 hours. Sepsis Labs: Recent Labs  Lab 11/15/23 1111 11/15/23 1309  LATICACIDVEN 0.8 0.8    Recent Results (from the past 240 hours)  Resp panel by RT-PCR (RSV, Flu A&B, Covid) Anterior Nasal Swab     Status: None   Collection Time: 11/15/23 11:11 AM   Specimen: Anterior Nasal Swab  Result Value Ref Range Status   SARS Coronavirus 2 by RT PCR NEGATIVE NEGATIVE Final    Comment: (NOTE) SARS-CoV-2 target nucleic acids are NOT DETECTED.  The SARS-CoV-2 RNA is generally detectable in upper respiratory specimens during the acute phase of infection. The lowest concentration of SARS-CoV-2 viral copies this assay can detect is 138 copies/mL. A negative result does not preclude SARS-Cov-2 infection and should not be used as the sole basis for treatment or other patient management decisions. A negative result may occur with  improper specimen collection/handling, submission of specimen other than nasopharyngeal swab, presence of viral mutation(s) within the areas targeted by this assay, and inadequate number of viral copies(<138 copies/mL). A negative result must be combined with clinical observations, patient history, and epidemiological information. The expected result is Negative.  Fact Sheet for Patients:  bloggercourse.com  Fact Sheet for Healthcare Providers:  seriousbroker.it  This test is no t yet approved or cleared by the [place]  FDA and  has been authorized for detection and/or diagnosis of SARS-CoV-2 by FDA under an Emergency Use Authorization (EUA). This  EUA will remain  in effect (meaning this test can be used) for the duration of the COVID-19 declaration under Section 564(b)(1) of the Act, 21 U.S.C.section 360bbb-3(b)(1), unless the authorization is terminated  or revoked sooner.       Influenza A by PCR NEGATIVE NEGATIVE Final   Influenza B by PCR NEGATIVE NEGATIVE Final    Comment: (NOTE) The Xpert Xpress SARS-CoV-2/FLU/RSV plus assay is intended as an aid in the diagnosis of influenza from Nasopharyngeal swab specimens and should not be used as a sole basis for treatment. Nasal washings and aspirates are unacceptable for Xpert Xpress SARS-CoV-2/FLU/RSV testing.  Fact Sheet for Patients: bloggercourse.com  Fact Sheet for Healthcare Providers: seriousbroker.it  This test is not yet approved or cleared by the [place]  FDA and has been authorized for detection and/or diagnosis of SARS-CoV-2 by FDA under an Emergency Use Authorization (EUA). This EUA will remain in effect (meaning this test can be used) for the duration of the COVID-19 declaration under Section 564(b)(1) of the Act, 21 U.S.C. section 360bbb-3(b)(1), unless the authorization is terminated or revoked.     Resp Syncytial Virus by PCR NEGATIVE NEGATIVE Final    Comment: (NOTE) Fact Sheet for Patients: bloggercourse.com  Fact Sheet for Healthcare Providers: seriousbroker.it  This test  is not yet approved or cleared by the [place]  FDA and has been authorized for detection and/or diagnosis of SARS-CoV-2 by FDA under an Emergency Use Authorization (EUA). This EUA will remain in effect (meaning this test can be used) for the duration of the COVID-19 declaration under Section 564(b)(1) of the Act, 21 U.S.C. section 360bbb-3(b)(1), unless the authorization is terminated or revoked.  Performed at [facility], [address], [place], [place] [address]    Group A Strep by PCR     Status: None   Collection Time: 11/15/23 11:23 AM   Specimen: Anterior Nasal Swab; Sterile Swab  Result Value Ref Range Status   Group A Strep by PCR NOT DETECTED NOT DETECTED Final    Comment: Performed at [facility], [address], [place], [place] [address]  Blood Culture (routine x 2)     Status: None (Preliminary result)   Collection Time: 11/15/23 12:24 PM   Specimen: Right Antecubital; Blood  Result Value Ref Range Status   Specimen Description   Final    RIGHT ANTECUBITAL BOTTLES DRAWN AEROBIC AND ANAEROBIC   Special Requests   Final    Blood Culture results may not be optimal due to an inadequate volume of blood received in culture bottles   Culture   Final    NO GROWTH 3 DAYS Performed at [facility], [address] [place], [place] [address]    Report Status PENDING  Incomplete  Blood Culture (routine x 2)     Status: None (Preliminary result)   Collection Time: 11/15/23 12:27 PM   Specimen: BLOOD RIGHT HAND  Result Value Ref Range Status   Specimen Description BLOOD RIGHT HAND AEROBIC BOTTLE ONLY  Final   Special Requests   Final    Blood Culture results may not be optimal due to an inadequate volume of blood received in culture bottles   Culture   Final    NO GROWTH 3 DAYS Performed at [facility], [address], [place], [place] [address]    Report Status PENDING  Incomplete  MRSA Next Gen by PCR, Nasal     Status: None   Collection Time: 11/17/23  6:28 PM   Specimen: Nasal Mucosa; Nasal Swab  Result Value Ref Range Status   MRSA by PCR Next Gen NOT DETECTED NOT DETECTED Final    Comment: (NOTE) The GeneXpert MRSA Assay (FDA approved for NASAL specimens only), is one component of a comprehensive MRSA colonization surveillance program. It is not intended to diagnose MRSA infection nor to guide or monitor treatment for MRSA infections. Test performance is not FDA approved in patients less than 75 years old. Performed at [facility], [address], [place], [place] [address]    Radiology Studies: DG CHEST PORT 1 VIEW Result Date: 11/17/2023 EXAM: 1 VIEW(S) XRAY OF THE CHEST 11/17/2023 07:09:00 PM COMPARISON: Comparison is made with 11/15/2023. CLINICAL HISTORY: 212385 Status post tracheostomy (HCC) 212385. FINDINGS: LINES, TUBES AND DEVICES: Interval placement of tracheostomy tube tip about 3.6 cm superior to the carina. LUNGS AND PLEURA: Mild atelectasis at the left base. No focal pulmonary opacity. No pulmonary edema. No pleural effusion. No pneumothorax. HEART AND MEDIASTINUM: No acute abnormality of the cardiac and mediastinal silhouettes. BONES AND SOFT TISSUES: No acute osseous abnormality. IMPRESSION: 1. Tracheostomy tube placement as above. 2. Mild left basilar atelectasis. Electronically signed by: Luke Bun MD 11/17/2023 08:20 PM EDT RP Workstation: [id number]   Scheduled Meds:  chlorhexidine  15 mL Mouth/Throat  Once   Chlorhexidine Gluconate Cloth  6 each Topical Daily   mouth rinse  15 mL Mouth Rinse 4 times per day   Continuous Infusions:  dextrose 5% lactated ringers 75 mL/hr at 11/18/23 1000   lactated ringers 10 mL/hr at 11/17/23 1626     LOS: 1 day    Time spent: 50 MINS    Darcel Dawley, MD [facility]   If 7PM-7AM, please contact night-coverage

## 2023-11-19 ENCOUNTER — Inpatient Hospital Stay (HOSPITAL_COMMUNITY)

## 2023-11-19 ENCOUNTER — Telehealth (INDEPENDENT_AMBULATORY_CARE_PROVIDER_SITE_OTHER): Payer: Self-pay | Admitting: Otolaryngology

## 2023-11-19 ENCOUNTER — Telehealth (INDEPENDENT_AMBULATORY_CARE_PROVIDER_SITE_OTHER): Payer: Self-pay

## 2023-11-19 DIAGNOSIS — Z930 Tracheostomy status: Secondary | ICD-10-CM | POA: Diagnosis not present

## 2023-11-19 DIAGNOSIS — E43 Unspecified severe protein-calorie malnutrition: Secondary | ICD-10-CM | POA: Diagnosis not present

## 2023-11-19 DIAGNOSIS — J392 Other diseases of pharynx: Secondary | ICD-10-CM | POA: Diagnosis not present

## 2023-11-19 HISTORY — PX: IR GASTROSTOMY TUBE MOD SED: IMG625

## 2023-11-19 LAB — COMPREHENSIVE METABOLIC PANEL WITH GFR
ALT: 8 U/L (ref 0–44)
AST: 17 U/L (ref 15–41)
Albumin: 2.6 g/dL — ABNORMAL LOW (ref 3.5–5.0)
Alkaline Phosphatase: 57 U/L (ref 38–126)
Anion gap: 9 (ref 5–15)
BUN: 6 mg/dL (ref 6–20)
CO2: 30 mmol/L (ref 22–32)
Calcium: 9.3 mg/dL (ref 8.9–10.3)
Chloride: 93 mmol/L — ABNORMAL LOW (ref 98–111)
Creatinine, Ser: 0.49 mg/dL — ABNORMAL LOW (ref 0.61–1.24)
GFR, Estimated: >60 mL/min (ref >60)
Glucose, Bld: 110 mg/dL — ABNORMAL HIGH (ref 70–99)
Potassium: 3.8 mmol/L (ref 3.5–5.1)
Sodium: 132 mmol/L — ABNORMAL LOW (ref 135–145)
Total Bilirubin: 0.9 mg/dL (ref 0.0–1.2)
Total Protein: 5.8 g/dL — ABNORMAL LOW (ref 6.5–8.1)

## 2023-11-19 LAB — PROTIME-INR
INR: 0.9 (ref 0.8–1.2)
Prothrombin Time: 12.7 s (ref 11.4–15.2)

## 2023-11-19 LAB — CBC
HCT: 35.1 % — ABNORMAL LOW (ref 39.0–52.0)
Hemoglobin: 11.4 g/dL — ABNORMAL LOW (ref 13.0–17.0)
MCH: 33.1 pg (ref 26.0–34.0)
MCHC: 32.5 g/dL (ref 30.0–36.0)
MCV: 102 fL — ABNORMAL HIGH (ref 80.0–100.0)
Platelets: 321 K/uL (ref 150–400)
RBC: 3.44 MIL/uL — ABNORMAL LOW (ref 4.22–5.81)
RDW: 11.9 % (ref 11.5–15.5)
WBC: 9.8 K/uL (ref 4.0–10.5)
nRBC: 0 % (ref 0.0–0.2)

## 2023-11-19 LAB — PHOSPHORUS: Phosphorus: 2.2 mg/dL — ABNORMAL LOW (ref 2.5–4.6)

## 2023-11-19 LAB — MAGNESIUM: Magnesium: 1.6 mg/dL — ABNORMAL LOW (ref 1.7–2.4)

## 2023-11-19 LAB — GLUCOSE, CAPILLARY: Glucose-Capillary: 130 mg/dL — ABNORMAL HIGH (ref 70–99)

## 2023-11-19 MED ORDER — CEFAZOLIN SODIUM-DEXTROSE 2-4 GM/100ML-% IV SOLN
INTRAVENOUS | Status: AC
  Filled 2023-11-19: qty 100

## 2023-11-19 MED ORDER — POTASSIUM PHOSPHATES 15 MMOLE/5ML IV SOLN
30.0000 mmol | Freq: Once | INTRAVENOUS | Status: AC
  Administered 2023-11-19: 30 mmol via INTRAVENOUS
  Filled 2023-11-19: qty 10

## 2023-11-19 MED ORDER — PROSOURCE TF20 ENFIT COMPATIBL EN LIQD: 60.0000 mL | Freq: Every day | ENTERAL | Status: DC

## 2023-11-19 MED ORDER — LIDOCAINE VISCOUS HCL 2 % MT SOLN
15.0000 mL | OROMUCOSAL | Status: AC
  Administered 2023-11-19: 15 mL via OROMUCOSAL

## 2023-11-19 MED ORDER — IOHEXOL 300 MG/ML  SOLN
50.0000 mL | Freq: Once | INTRAMUSCULAR | Status: AC | PRN
  Administered 2023-11-19: 15 mL

## 2023-11-19 MED ORDER — FENTANYL CITRATE (PF) 100 MCG/2ML IJ SOLN
INTRAMUSCULAR | Status: AC | PRN
  Administered 2023-11-19 (×2): 50 ug via INTRAVENOUS

## 2023-11-19 MED ORDER — FENTANYL CITRATE (PF) 100 MCG/2ML IJ SOLN
INTRAMUSCULAR | Status: AC
  Filled 2023-11-19: qty 2

## 2023-11-19 MED ORDER — MAGNESIUM SULFATE 2 GM/50ML IV SOLN
2.0000 g | Freq: Once | INTRAVENOUS | Status: AC
  Administered 2023-11-19: 2 g via INTRAVENOUS
  Filled 2023-11-19: qty 50

## 2023-11-19 MED ORDER — LIDOCAINE-EPINEPHRINE 1 %-1:100000 IJ SOLN
INTRAMUSCULAR | Status: AC
  Filled 2023-11-19: qty 1

## 2023-11-19 MED ORDER — MIDAZOLAM HCL (PF) 2 MG/2ML IJ SOLN
INTRAMUSCULAR | Status: AC | PRN
  Administered 2023-11-19 (×2): 1 mg via INTRAVENOUS

## 2023-11-19 MED ORDER — OSMOLITE 1.5 CAL PO LIQD
1000.0000 mL | ORAL | Status: DC
  Administered 2023-11-19: 1000 mL
  Filled 2023-11-19 (×3): qty 1000

## 2023-11-19 MED ORDER — THIAMINE MONONITRATE 100 MG PO TABS
100.0000 mg | ORAL_TABLET | Freq: Every day | ORAL | Status: AC
  Administered 2023-11-19 – 2023-11-23 (×5): 100 mg
  Filled 2023-11-19 (×5): qty 1

## 2023-11-19 MED ORDER — MIDAZOLAM HCL 2 MG/2ML IJ SOLN
INTRAMUSCULAR | Status: AC
  Filled 2023-11-19: qty 2

## 2023-11-19 MED ORDER — LIDOCAINE-EPINEPHRINE 1 %-1:100000 IJ SOLN
20.0000 mL | Freq: Once | INTRAMUSCULAR | Status: AC
  Administered 2023-11-19: 20 mL via INTRADERMAL

## 2023-11-19 NOTE — Telephone Encounter (Signed)
 Jerome Washington called in from the hospital with questions on the patient's diet.  She wanted to know she can start him back on a liquid diet after his procedure today?  Also she wants to know if/when she can advance his diet as long as he is doing well in therapy with her?  Thanks!

## 2023-11-19 NOTE — Procedures (Signed)
 Interventional Radiology Procedure Note  Procedure: Placement of percutaneous 50F balloon-retention gastrostomy tube.  Complications: None  EBL:  < 5 mL  Recommendations: - NPO for 4 hours after placement - Maintain G-tube to low wall suction for 4 hours after placement - May advance diet as tolerated and begin using tube 4 hours after placement - Gastropexy sutures are absorbable and do not require removal  Marliss Coots, MD Pager: [phone]

## 2023-11-19 NOTE — Telephone Encounter (Signed)
 Spoke to [place] from [facility] and informed her of providers recommendations.

## 2023-11-19 NOTE — Progress Notes (Signed)
 PROGRESS NOTE        PATIENT DETAILS Name: Jerome Washington Age: 53 y.o. Sex: male Date of Birth: [birth year] Admit Date: 11/17/2023 Admitting Physician Lonna Coder, MD ERE:[id number], [name], NP  Brief Summary: Patient is a 53 y.o.  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  Subsequently admitted to the ICU postoperatively-stabilized and then transferred to [facility].   Significant events: 10/28>> admit by ENT-tracheostomy/biopsy-transferred to ICU 10/29>> transferred to [facility] 10/30>> G-tube by IR  Significant studies: 10/26>> CT soft tissue neck: 6.3 x 3.2 x 4.3 mass in the posterior oropharynx-enlarged right posterior lateral retropharyngeal lymph node.  Significant microbiology data: None  Procedures: 10/28>> tracheotomy-laryngoscopy with biopsy. 10/30>> G-tube by IR  Consults: PCCM ENT  Subjective: Lying comfortably in bed-denies any chest pain or shortness of breath.  No complaints.  Objective: Vitals: Blood pressure 123/78, pulse 82, temperature 98.2 F (36.8 C), temperature source Oral, resp. rate 20, height 5' 11 (1.803 m), weight 50.8 kg, SpO2 100%.   Exam: Gen Exam:Alert awake-not in any distress HEENT:atraumatic, normocephalic Chest: B/L clear to auscultation anteriorly CVS:S1S2 regular Abdomen:soft non tender, non distended Extremities:no edema Neurology: Non focal Skin: no rash  Pertinent Labs/Radiology:    Latest Ref Rng & Units 11/19/2023    2:14 AM 11/15/2023   11:11 AM 08/17/2022    1:01 PM  CBC  WBC 4.0 - 10.5 K/uL 9.8  11.4  5.0   Hemoglobin 13.0 - 17.0 g/dL 88.5  85.3  84.9   Hematocrit 39.0 - 52.0 % 35.1  44.3  44.5   Platelets 150 - 400 K/uL 321  379  211     Lab Results  Component  Value Date   NA 132 (L) 11/19/2023   K 3.8 11/19/2023   CL 93 (L) 11/19/2023   CO2 30 11/19/2023      Assessment/Plan: 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 (Active)    Underweight: Estimated body mass index is 15.62 kg/m as calculated from the following:   Height as of this encounter: 5' 11 (1.803 m).   Weight as of this encounter: 50.8 kg.   Code status:   Code Status: Full Code   DVT Prophylaxis: heparin injection 5,000 Units Start: 11/18/23 1330 SCDs Start: 11/17/23 1828   Family Communication: None at bedside   Disposition Plan: Status is: Inpatient Remains inpatient appropriate because: Severity of illness   Planned Discharge Destination:Home   Diet: Diet Order  Diet NPO time specified  Diet effective midnight                     Antimicrobial agents: Anti-infectives (From admission, onward)    Start     Dose/Rate Route Frequency Ordered Stop   11/19/23 0600  ceFAZolin (ANCEF) IVPB 2g/100 mL premix        2 g 200 mL/hr over 30 Minutes Intravenous To  Radiology 11/18/23 1544 11/19/23 0859        MEDICATIONS: Scheduled Meds:  chlorhexidine  15 mL Mouth/Throat Once   Chlorhexidine Gluconate Cloth  6 each Topical Daily   feeding supplement  1 Container Oral TID BM   heparin injection (subcutaneous)  5,000 Units Subcutaneous Q12H   mouth rinse  15 mL Mouth Rinse 4 times per day   Continuous Infusions:  lactated ringers 10 mL/hr at 11/17/23 1626   potassium PHOSPHATE IVPB (in mmol)     PRN Meds:.docusate sodium, fentaNYL  (SUBLIMAZE ) injection, fentaNYL , midazolam PF, mouth rinse, polyethylene glycol   I have personally reviewed following labs and imaging studies  LABORATORY DATA: CBC: Recent Labs  Lab 11/15/23 1111 11/19/23 0214  WBC 11.4* 9.8  NEUTROABS 9.1*  --   HGB 14.6 11.4*  HCT 44.3 35.1*  MCV 103.3* 102.0*  PLT 379 321    Basic Metabolic Panel: Recent Labs  Lab 11/15/23 1111 11/19/23 0214  NA 135 132*  K 4.3 3.8  CL 92* 93*  CO2 34* 30  GLUCOSE 85 110*  BUN 8 6  CREATININE 0.47* 0.49*  CALCIUM 10.3 9.3  MG  --  1.6*  PHOS  --  2.2*    GFR: Estimated Creatinine Clearance: 79.4 mL/min (A) (by C-G formula based on SCr of 0.49 mg/dL (L)).  Liver Function Tests: Recent Labs  Lab 11/15/23 1111 11/19/23 0214  AST 22 17  ALT 6 8  ALKPHOS 121 57  BILITOT 0.5 0.9  PROT 7.4 5.8*  ALBUMIN 4.0 2.6*   No results for input(s): LIPASE, AMYLASE in the last 168 hours. No results for input(s): AMMONIA in the last 168 hours.  Coagulation Profile: Recent Labs  Lab 11/19/23 0214  INR 0.9    Cardiac Enzymes: No results for input(s): CKTOTAL, CKMB, CKMBINDEX, TROPONINI in the last 168 hours.  BNP (last 3 results) No results for input(s): PROBNP in the last 8760 hours.  Lipid Profile: No results for input(s): CHOL, HDL, LDLCALC, TRIG, CHOLHDL, LDLDIRECT in the last 72 hours.  Thyroid Function Tests: No results for input(s): TSH, T4TOTAL, FREET4, T3FREE,  THYROIDAB in the last 72 hours.  Anemia Panel: No results for input(s): VITAMINB12, FOLATE, FERRITIN, TIBC, IRON, RETICCTPCT in the last 72 hours.  Urine analysis:    Component Value Date/Time   COLORURINE YELLOW 08/17/2022 1230   APPEARANCEUR CLEAR 08/17/2022 1230   LABSPEC 1.008 08/17/2022 1230   PHURINE 5.0 08/17/2022 1230   GLUCOSEU NEGATIVE 08/17/2022 1230   HGBUR NEGATIVE 08/17/2022 1230   BILIRUBINUR NEGATIVE 08/17/2022 1230   KETONESUR NEGATIVE 08/17/2022 1230   PROTEINUR NEGATIVE 08/17/2022 1230   UROBILINOGEN 0.2 05/30/2011 1345   NITRITE NEGATIVE 08/17/2022 1230   LEUKOCYTESUR NEGATIVE 08/17/2022 1230    Sepsis Labs: Lactic Acid, Venous    Component Value Date/Time   LATICACIDVEN 0.8 11/15/2023 1309    MICROBIOLOGY: Recent Results (from the past 240 hours)  Resp panel by RT-PCR (RSV, Flu A&B, Covid) Anterior Nasal Swab     Status: None   Collection Time: 11/15/23 11:11 AM  Specimen: Anterior Nasal Swab  Result Value Ref Range Status   SARS Coronavirus 2 by RT PCR NEGATIVE NEGATIVE Final    Comment: (NOTE) SARS-CoV-2 target nucleic acids are NOT DETECTED.  The SARS-CoV-2 RNA is generally detectable in upper respiratory specimens during the acute phase of infection. The lowest concentration of SARS-CoV-2 viral copies this assay can detect is 138 copies/mL. A negative result does not preclude SARS-Cov-2 infection and should not be used as the sole basis for treatment or other patient management decisions. A negative result may occur with  improper specimen collection/handling, submission of specimen other than nasopharyngeal swab, presence of viral mutation(s) within the areas targeted by this assay, and inadequate number of viral copies(<138 copies/mL). A negative result must be combined with clinical observations, patient history, and epidemiological information. The expected result is Negative.  Fact Sheet for Patients:   bloggercourse.com  Fact Sheet for Healthcare Providers:  seriousbroker.it  This test is no t yet approved or cleared by the [place]  FDA and  has been authorized for detection and/or diagnosis of SARS-CoV-2 by FDA under an Emergency Use Authorization (EUA). This EUA will remain  in effect (meaning this test can be used) for the duration of the COVID-19 declaration under Section 564(b)(1) of the Act, 21 U.S.C.section 360bbb-3(b)(1), unless the authorization is terminated  or revoked sooner.       Influenza A by PCR NEGATIVE NEGATIVE Final   Influenza B by PCR NEGATIVE NEGATIVE Final    Comment: (NOTE) The Xpert Xpress SARS-CoV-2/FLU/RSV plus assay is intended as an aid in the diagnosis of influenza from Nasopharyngeal swab specimens and should not be used as a sole basis for treatment. Nasal washings and aspirates are unacceptable for Xpert Xpress SARS-CoV-2/FLU/RSV testing.  Fact Sheet for Patients: bloggercourse.com  Fact Sheet for Healthcare Providers: seriousbroker.it  This test is not yet approved or cleared by the [place]  FDA and has been authorized for detection and/or diagnosis of SARS-CoV-2 by FDA under an Emergency Use Authorization (EUA). This EUA will remain in effect (meaning this test can be used) for the duration of the COVID-19 declaration under Section 564(b)(1) of the Act, 21 U.S.C. section 360bbb-3(b)(1), unless the authorization is terminated or revoked.     Resp Syncytial Virus by PCR NEGATIVE NEGATIVE Final    Comment: (NOTE) Fact Sheet for Patients: bloggercourse.com  Fact Sheet for Healthcare Providers: seriousbroker.it  This test is not yet approved or cleared by the [place]  FDA and has been authorized for detection and/or diagnosis of SARS-CoV-2 by FDA under an Emergency Use  Authorization (EUA). This EUA will remain in effect (meaning this test can be used) for the duration of the COVID-19 declaration under Section 564(b)(1) of the Act, 21 U.S.C. section 360bbb-3(b)(1), unless the authorization is terminated or revoked.  Performed at [facility], [address], [place], [place] [address]   Group A Strep by PCR     Status: None   Collection Time: 11/15/23 11:23 AM   Specimen: Anterior Nasal Swab; Sterile Swab  Result Value Ref Range Status   Group A Strep by PCR NOT DETECTED NOT DETECTED Final    Comment: Performed at [facility], [address] [place], [place] [address]  Blood Culture (routine x 2)     Status: None (Preliminary result)   Collection Time: 11/15/23 12:24 PM   Specimen: Right Antecubital; Blood  Result Value Ref Range Status   Specimen Description   Final    RIGHT ANTECUBITAL BOTTLES DRAWN AEROBIC AND  ANAEROBIC   Special Requests   Final    Blood Culture results may not be optimal due to an inadequate volume of blood received in culture bottles   Culture   Final    NO GROWTH 4 DAYS Performed at [facility], [address] [place], [place] [address]    Report Status PENDING  Incomplete  Blood Culture (routine x 2)     Status: None (Preliminary result)   Collection Time: 11/15/23 12:27 PM   Specimen: BLOOD RIGHT HAND  Result Value Ref Range Status   Specimen Description BLOOD RIGHT HAND AEROBIC BOTTLE ONLY  Final   Special Requests   Final    Blood Culture results may not be optimal due to an inadequate volume of blood received in culture bottles   Culture   Final    NO GROWTH 4 DAYS Performed at [facility], [address], [place], [place] [address]    Report Status PENDING  Incomplete  MRSA Next Gen by PCR, Nasal     Status: None   Collection Time: 11/17/23  6:28 PM   Specimen: Nasal Mucosa; Nasal Swab  Result Value Ref Range Status   MRSA by PCR Next Gen NOT DETECTED NOT DETECTED Final    Comment: (NOTE) The GeneXpert MRSA  Assay (FDA approved for NASAL specimens only), is one component of a comprehensive MRSA colonization surveillance program. It is not intended to diagnose MRSA infection nor to guide or monitor treatment for MRSA infections. Test performance is not FDA approved in patients less than 28 years old. Performed at [facility], [address], [place], [place] [address]     RADIOLOGY STUDIES/RESULTS: CT ABDOMEN WO CONTRAST Result Date: 11/18/2023 CLINICAL DATA:  53 year old male, preprocedure planning for percutaneous gastrostomy tube placement. EXAM: CT ABDOMEN WITHOUT CONTRAST TECHNIQUE: Multidetector CT imaging of the abdomen was performed following the standard protocol without IV contrast. RADIATION DOSE REDUCTION: This exam was performed according to the departmental dose-optimization program which includes automated exposure control, adjustment of the mA and/or kV according to patient size and/or use of iterative reconstruction technique. COMPARISON:  08/17/2022 FINDINGS: Lower chest: No acute abnormality. Hepatobiliary: No focal liver abnormality is seen. No gallstones, gallbladder wall thickening, or biliary dilatation. Pancreas: Unremarkable. No pancreatic ductal dilatation or surrounding inflammatory changes. Spleen: Normal in size without focal abnormality. Adrenals/Urinary Tract: Adrenal glands are unremarkable. Punctate left inferior pole nonobstructive renal calculus. The kidneys are otherwise normal. Stomach/Bowel: Stomach is within normal limits. Appendix appears normal. No evidence of bowel wall thickening, distention, or inflammatory changes. Vascular/Lymphatic: Aortic atherosclerosis. No enlarged abdominal lymph nodes. Other: No abdominal wall hernia or abnormality. Musculoskeletal: No acute or significant osseous findings. IMPRESSION: 1. Favorable anatomy for percutaneous gastrostomy tube placement. 2.  Aortic Atherosclerosis (ICD10-I70.0). Ester Sides, MD [facility] Radiology Electronically Signed   By: Ester Sides M.D.   On: 11/18/2023 15:04   DG CHEST PORT 1 VIEW Result Date: 11/17/2023 EXAM: 1 VIEW(S) XRAY OF THE CHEST 11/17/2023 07:09:00 PM COMPARISON: Comparison is made with 11/15/2023. CLINICAL HISTORY: 212385 Status post tracheostomy (HCC) 212385. FINDINGS: LINES, TUBES AND DEVICES: Interval placement of tracheostomy tube tip about 3.6 cm superior to the carina. LUNGS AND PLEURA: Mild atelectasis at the left base. No focal pulmonary opacity. No pulmonary edema. No pleural effusion. No pneumothorax. HEART AND MEDIASTINUM: No acute abnormality of the cardiac and mediastinal silhouettes. BONES AND SOFT TISSUES: No acute osseous abnormality. IMPRESSION: 1. Tracheostomy tube placement as above.  2. Mild left basilar atelectasis. Electronically signed by: Luke Bun MD 11/17/2023 08:20 PM EDT RP Workstation: [id number]     LOS: 2 days   Donalda Applebaum, MD  [facility]    To contact the attending provider between 7A-7P or the covering provider during after hours 7P-7A, please log into the web site [email or web address] and access using universal [facility] password for that web site. If you do not have the password, please call the hospital operator.  11/19/2023, 9:04 AM

## 2023-11-19 NOTE — Progress Notes (Signed)
 Came to room for morning trach eval, pt is currently out of room at this time.

## 2023-11-19 NOTE — Evaluation (Signed)
 Occupational Therapy Evaluation/Discharge Patient Details Name: Jerome Washington MRN: [id number] DOB: Apr 23, 1970 Today's Date: 11/19/2023   History of Present Illness   Patient is a 53 y.o.  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  Subsequently admitted to the ICU postoperatively. 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     Clinical Impressions PTA, pt lives with mother and typically Independent with ADLs and mobility with recent use of cane outside of the home. Pt presents now at baseline for ADLs/mobility, Modified Independent without safety concerns or LOB. VSS on RA. Pt denies concerns managing daily tasks at home. No further skilled OT services needed at this time. Recommend continued OOB activity on unit with mobility specialists.      If plan is discharge home, recommend the following:   Other (comment) (PRN)     Functional Status Assessment   Patient has not had a recent decline in their functional status     Equipment Recommendations   None recommended by OT     Recommendations for Other Services         Precautions/Restrictions   Precautions Precautions: Fall Recall of Precautions/Restrictions: Intact Precaution/Restrictions Comments: Trach Restrictions Weight Bearing Restrictions Per Provider Order: No     Mobility Bed Mobility Overal bed mobility: Modified Independent                  Transfers Overall transfer level: Independent Equipment used: None                      Balance Overall balance assessment: No apparent balance deficits (not formally assessed)                                         ADL either performed or assessed with clinical judgement   ADL Overall ADL's : Modified independent;At baseline                                        General ADL Comments: pt able to easily transfer and mobilize out on unit without AD. Pt denies concerns with ADLs. Anticipate pt could be independent for bathroom tasks while admitted.     Vision Ability to See in Adequate Light: 0 Adequate Patient Visual Report: No change from baseline Vision Assessment?: No apparent visual deficits     Perception         Praxis         Pertinent Vitals/Pain Pain Assessment Pain Assessment: Faces Faces Pain Scale: Hurts a little bit Pain Location: G tube site Pain Descriptors / Indicators: Sore Pain Intervention(s): Monitored during session     Extremity/Trunk Assessment Upper Extremity Assessment Upper Extremity Assessment: Overall WFL for tasks assessed;Right hand dominant   Lower Extremity Assessment Lower Extremity Assessment: Defer to PT evaluation   Cervical / Trunk Assessment Cervical / Trunk Assessment: Normal   Communication Communication Communication: Impaired Factors Affecting Communication: Passey - Muir valve;Trach/intubated   Cognition Arousal: Alert Behavior During Therapy: WFL for tasks assessed/performed Cognition: No apparent impairments  Following commands: Intact       Cueing  General Comments   Cueing Techniques: Verbal cues  VSS on RA   Exercises     Shoulder Instructions      Home Living Family/patient expects to be discharged to:: Private residence Living Arrangements: Parent Available Help at Discharge: Family Type of Home: House Home Access: Ramped entrance     Home Layout: One level     Bathroom Shower/Tub: Chief Strategy Officer: Handicapped height     Home Equipment: Cane - single Librarian, Academic (2 wheels);Shower seat - built in;Grab bars - tub/shower;Hand held shower head          Prior Functioning/Environment Prior Level of Function : Independent/Modified Independent              Mobility Comments: sometimes using cane outside the house ADLs Comments: Indep with ADLs. Mom does most household IADLs    OT Problem List:     OT Treatment/Interventions:        OT Goals(Current goals can be found in the care plan section)   Acute Rehab OT Goals Patient Stated Goal: learn how to care for trach, home soon OT Goal Formulation: All assessment and education complete, DC therapy   OT Frequency:       Co-evaluation              AM-PAC OT 6 Clicks Daily Activity     Outcome Measure Help from another person eating meals?: None Help from another person taking care of personal grooming?: None Help from another person toileting, which includes using toliet, bedpan, or urinal?: None Help from another person bathing (including washing, rinsing, drying)?: None Help from another person to put on and taking off regular upper body clothing?: None Help from another person to put on and taking off regular lower body clothing?: None 6 Click Score: 24   End of Session Equipment Utilized During Treatment: Gait belt Nurse Communication: Mobility status  Activity Tolerance: Patient tolerated treatment well Patient left: in bed;with call bell/phone within reach;with bed alarm set  OT Visit Diagnosis: Muscle weakness (generalized) (M62.81)                Time: 8856-8798 OT Time Calculation (min): 18 min Charges:  OT General Charges $OT Visit: 1 Visit OT Evaluation $OT Eval Low Complexity: 1 Low  Mliss NOVAK, OTR/L Acute Rehab Services Office: [phone]   Mliss Fish 11/19/2023, 12:45 PM

## 2023-11-19 NOTE — Progress Notes (Signed)
 ENT office called, left message to clarify if MD wants trach HME or PMV since both can't be worn simultaneous.  Waiting to hear back from MD.

## 2023-11-19 NOTE — Evaluation (Signed)
 Physical Therapy Brief Evaluation and Discharge Note Patient Details Name: NED KAKAR MRN: [id number] DOB: [birth year] Today's Date: 11/19/2023   History of Present Illness  Patient is a 53 y.o.  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  Subsequently admitted to the ICU postoperatively. 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  Clinical Impression  Pt presents with admitting diagnosis above. Co-treat with OT. Pt today was able to ambulate around unit with SPC/no AD independently. Pt also able to perform DGI scoring 22/24 indicating that he is not a high fall risk. PTA pt was fully independent. Pt presents at or near baseline mobility. Pt has no further acute PT needs and will be signing off. Re consult PT if mobility status changes. Pt would benefit from continued mobility with mobility specialist during acute stay.        PT Assessment Patient does not need any further PT services  Assistance Needed at Discharge  PRN    Equipment Recommendations None recommended by PT  Recommendations for Other Services       Precautions/Restrictions Precautions Precautions: Fall Recall of Precautions/Restrictions: Intact Precaution/Restrictions Comments: Trach Restrictions Weight Bearing Restrictions Per Provider Order: No        Mobility  Bed Mobility   Supine/Sidelying to sit: Modified independent (Device/Increased time) Sit to supine/sidelying: Modified independent (Device/Increased time)    Transfers Overall transfer level: Independent Equipment used: None                    Ambulation/Gait Ambulation/Gait assistance: Independent, Modified independent (Device/Increase time) Gait Distance (Feet): 600 Feet Assistive device: None, Straight cane Gait Pattern/deviations: WFL(Within Functional  Limits) Gait Speed: Pace WFL General Gait Details: Pt initally started out with [facility] however quickly picked it up and ambulated without it. DGI completed. no LOB noted  Home Activity Instructions    Stairs            Modified Rankin (Stroke Patients Only)        Balance Overall balance assessment: No apparent balance deficits (not formally assessed)               Standardized Balance Assessment Standardized Balance Assessment : Dynamic Gait Index Dynamic Gait Index Level Surface: Normal Change in Gait Speed: Normal Gait with Horizontal Head Turns: Normal Gait with Vertical Head Turns: Normal Gait and Pivot Turn: Mild Impairment Step Over Obstacle: Normal Step Around Obstacles: Normal Steps: Mild Impairment (Clinical reason; did not perform) Total Score: 22      Pertinent Vitals/Pain PT - Brief Vital Signs All Vital Signs Stable: Yes Pain Assessment Pain Assessment: Faces Faces Pain Scale: Hurts a little bit Pain Location: G tube site Pain Descriptors / Indicators: Sore Pain Intervention(s): Monitored during session, Repositioned     Home Living Family/patient expects to be discharged to:: Private residence Living Arrangements: Parent Available Help at Discharge: Family;Available 24 hours/day Home Environment: Ramped entrance   Home Equipment: Cane - single Librarian, Academic (2 wheels);Shower seat - built in;Grab bars - tub/shower;Hand held shower head        Prior Function Level of Independence: Independent      UE/LE Assessment   UE ROM/Strength/Tone/Coordination: WFL    LE ROM/Strength/Tone/Coordination: [facility] Hights      Communication   Communication Communication: Impaired Factors Affecting Communication: Passey - Muir valve;Trach/intubated     Cognition Overall Cognitive Status:  Appears within functional limits for tasks assessed/performed       General Comments General comments (skin integrity, edema, etc.): VSS on RA     Exercises     Assessment/Plan    PT Problem List         PT Visit Diagnosis Other abnormalities of gait and mobility (R26.89)    No Skilled PT Patient at baseline level of functioning;Patient is independent with all acitivity/mobility   Co-evaluation                AMPAC 6 Clicks Help needed turning from your back to your side while in a flat bed without using bedrails?: None Help needed moving from lying on your back to sitting on the side of a flat bed without using bedrails?: None Help needed moving to and from a bed to a chair (including a wheelchair)?: None Help needed standing up from a chair using your arms (e.g., wheelchair or bedside chair)?: None Help needed to walk in hospital room?: None Help needed climbing 3-5 steps with a railing? : A Little 6 Click Score: 23      End of Session Equipment Utilized During Treatment: Gait belt Activity Tolerance: Patient tolerated treatment well Patient left: in bed;with call bell/phone within reach;with bed alarm set Nurse Communication: Mobility status PT Visit Diagnosis: Other abnormalities of gait and mobility (R26.89)     Time: 8854-8797 PT Time Calculation (min) (ACUTE ONLY): 17 min  Charges:   PT Evaluation $PT Eval Low Complexity: 1 Low      Haly Feher B, PT, DPT Acute Rehab Services [phone]   Sueellen Buddle  11/19/2023, 12:18 PM

## 2023-11-19 NOTE — Progress Notes (Signed)
 Per disucssion w/ Dr Tobie, okay for pt to use PMV instead of HME for now.  VSS, sat 96% on room air ATC, pt denies SOB- no distress noted.

## 2023-11-19 NOTE — Progress Notes (Signed)
 Speech Language Pathology Treatment: Dysphagia  Patient Details Name: Jerome Washington MRN: [id number] DOB: Jun 04, 1970 Today's Date: 11/19/2023 Time: 8493-8474 SLP Time Calculation (min) (ACUTE ONLY): 19 min  Assessment / Plan / Recommendation Clinical Impression  Pt seen for swallowing assessment to potentially advance diet after PEG tube placement and to check for tolerance of PMSV. Pt reports mixed recommendations from staff on whether he can wear PMSV or not. Pts trach is cuffed with cuff deflated. He is on humidified medical air, but was not wearing his trach collar upon SLP arrival. Without PMSV in place pt can partially phonate, but vocal quality breathy and pt poorly intelligible given impaired resonance and decreased lingual ROM. Pt was using digital occlusion to improve intelligibility. SLP placed PMSV and pt wore this for over 10 minutes with no discomfort. Pt able to place and remove valve independently. Given that he is not using a ventilator and is out of ICU, there is no reason for any further cuff inflation. ENT also planning to place a cuffless trach soon. Pt may wear PMSV all waking hours and remove for sleep. He is able to place and remove valve. Signs placed  and safety reviewed with pt.   Regarding swallowing pt endorses significant difficulty swallowing any solid foods prior to admit. He reported globus and expectoration. When observed with thin liquids pt did not cough but some subjective abnormalities were observed (loud swishing and multiple swallows). Advise MBS tomorrow am prior to advancing oral diet. Pt may continue ice chips today.    HPI HPI: Patient is a 53 y.o. male who presented 10/28 for operative treatment of oropharynx mass. Pt underwent tracheostomy on 10/28 due to respiratory distress. 6 cuffed trach initially placed, plan for 4 cuffless eventually. ENT reports Large oropharynx mass almost 270 degrees; tongue base did not appear involved but difficult to see  entirety given bulk of mass. Distal airway including epiglottis and larynx not involved. Pt has a  prior diagnosis SCC of the mouth s/p glossectomy and radical neck dissection on 10.20.20. Pt has reported recent difficulty swallowing and has been observed to be malnourished. Pt had PEG tube placed on 11/18/23 to supplement nutrition.      SLP Plan  MBS          Recommendations  Diet recommendations: NPO;Other(comment) (ice chips) Medication Administration: Via alternative means                              MBS     Tyreek Clabo, Consuelo Fitch  11/19/2023, 3:32 PM

## 2023-11-19 NOTE — Plan of Care (Signed)

## 2023-11-19 NOTE — Progress Notes (Signed)
 ENT Note: NO issues with trach. Planning for G-tube today.  As such, will plan to change tracheostomy to 6 cuffless tomorrow and patient.  If possible, please have RT bring 6 cuffless tracheostomy to bedside.  Once tracheostomy changed and patient has G-tube and tracheostomy teaching and supplies, ok for discharge.    Woodruff Skirvin B Parmvir Boomer

## 2023-11-19 NOTE — Telephone Encounter (Signed)
 Sarah at respiratory therapy at [facility] called asking to talk to you about getting the exact treatment plan for this Pt would like you to call her back at   [phone]

## 2023-11-19 NOTE — Progress Notes (Signed)
 [facility] night cross cover note:   Morning labs today notable for sodium of 132, relative to 135 on 11/15/23, mag 1.6, and phos 2.2 in this patient is currently n.p.o. in anticipation of G-tube placement this morning.  I subsequently ordered magnesium  sulfate 2 g IV over 2 hours.  Anticipate phosphorus supplementation following G-tube placement this morning.    Eva Pore, DO Hospitalist

## 2023-11-19 NOTE — Progress Notes (Addendum)
 Nutrition Follow-up  DOCUMENTATION CODES:   Severe malnutrition in context of chronic illness, Underweight  INTERVENTION:  Initiate tube feeds via PEG: Osmolite 1.5 goal rate 59ml/hr ( per day) *initiate at 20ml and advance by 10ml q10h to goal rate  Provides 1800 kcal, 75g protein and free water daily  If tolerating continuous feedings at goal rate,transition to bolus feeds Administer 1/2 carton of formula for first two feeds and continue to increase by 1/2 carton every 2 feeds until goal amount is tolerated   Recommended bolus schedule once goal rate achieved and out of refeeding window: Osmolite 1.5 (237 ml) - 5 cartons daily across 4 feedings 2 cartons once daily + 1 carton TID 30 ml free water flush before and after each bolus (240 ml per day)  Provides  1775 kcals, 75g protein and 1145 ml free water per day (FWF + TF)  Monitor magnesium , potassium, and phosphorus daily for at least 3 days, MD to replete as needed, as pt is at risk for refeeding syndrome given severe malnutrition and inadequate oral intake Start Thiamine 100mg  daily x5 days Recommend swallow assessment as appropriate; SLP following for PMV trials and may require separate assessment   NUTRITION DIAGNOSIS:  Severe Malnutrition related to chronic illness as evidenced by severe muscle depletion, severe fat depletion, percent weight loss. - remains applicable  GOAL:  Patient will meet greater than or equal to 90% of their needs - to be met via TF at goal rate  MONITOR:   Diet advancement, Labs, Weight trends  REASON FOR ASSESSMENT:   Other (Comment), Malnutrition Screening Tool (Low BMI)    ASSESSMENT:   Pt admitted for urgent trach placement d/t oropharynx mass. PMH significant for tobacco use, h/o oral cavity SCCa s/p resection, GERD, CAD.  10/28: admitted, OR for tracheostomy, direct laryngoscopy  with biopsy 10/29: transfer out of ICU 10/30:G-tube placed; TF initiated   G-tube placed  by IR this morning with ability to begin using later today. TF orders entered. Goal is to transition to bolus feedings prior to discharge. He is at high refeeding risk and will need time to titrate up to goal rate and monitor for refeeding as well as assess tolerance.  Notably, SLP consulted for PMV trials however he would also likely benefit from swallow assessment. This was deferred until after G-tube placed. Per SLP note this afternoon, scheduled for MBS tomorrow to assess ability to advance diet.    Spoke with pt at bedside this afternoon. He is donning PMV and able to produce speech. States he is in some pain post procedure and states pain medication is effective in managing. Tube feeds started around 1PM. Should achieve goal rate by around 7PM tomorrow evening. If tolerating, may transition to bolus schedule. Recommendations above.    Admit Weight: 47.2 kg Current Weight: 50.8 kg   Weight stable with slight increase in trend likely related to transfer to different unit. No edema on exam. Bowels stable.   Medications: 2g Mg sulfate x1, 30mmol K+ PHOS x1  Has required phosphorus and potassium repletion. Monitoring for refeeding s/p initiation of tube feeds. Noted to have been ordered dextrose on 10/28 with first PHOS/Mg lab draws this morning. Unsure if this is refeeding or r/t prolonged poor PO intake.   Labs:  Na+ 132 (L) K+ 3.8 (wdl) PHOS 2.2 (L) Mg 1.6 (L) CBG's 85-110 x72 hours   Diet Order:   Diet Order  Diet NPO time specified  Diet effective midnight                  EDUCATION NEEDS:   Not appropriate for education at this time  Skin:  Skin Assessment: Skin Integrity Issues: Skin Integrity Issues:: Stage I Stage I: coccyx  Last BM:  10/28  Height:  Ht Readings from Last 1 Encounters:  11/17/23 5' 11 (1.803 m)   Weight:  Wt Readings from Last 1 Encounters:  11/19/23 50.8 kg    Ideal Body Weight:  78.2 kg  BMI:  Body mass index is 15.62  kg/m.  Estimated Nutritional Needs:   Kcal:  1700-1900  Protein:  75-90g  Fluid:  >/=1.7L  Blair Deaner MS, RD, LDN Registered Dietitian Clinical Nutrition RD Inpatient Contact Info in Amion

## 2023-11-20 ENCOUNTER — Inpatient Hospital Stay (HOSPITAL_COMMUNITY)

## 2023-11-20 DIAGNOSIS — J392 Other diseases of pharynx: Secondary | ICD-10-CM | POA: Diagnosis not present

## 2023-11-20 DIAGNOSIS — Z930 Tracheostomy status: Secondary | ICD-10-CM | POA: Diagnosis not present

## 2023-11-20 DIAGNOSIS — E43 Unspecified severe protein-calorie malnutrition: Secondary | ICD-10-CM | POA: Diagnosis not present

## 2023-11-20 LAB — PROCALCITONIN: Procalcitonin: <0.1 ng/mL

## 2023-11-20 LAB — CBC
HCT: 38.7 % — ABNORMAL LOW (ref 39.0–52.0)
Hemoglobin: 12.8 g/dL — ABNORMAL LOW (ref 13.0–17.0)
MCH: 33.2 pg (ref 26.0–34.0)
MCHC: 33.1 g/dL (ref 30.0–36.0)
MCV: 100.5 fL — ABNORMAL HIGH (ref 80.0–100.0)
Platelets: 346 K/uL (ref 150–400)
RBC: 3.85 MIL/uL — ABNORMAL LOW (ref 4.22–5.81)
RDW: 11.9 % (ref 11.5–15.5)
WBC: 8.7 K/uL (ref 4.0–10.5)
nRBC: 0 % (ref 0.0–0.2)

## 2023-11-20 LAB — GLUCOSE, CAPILLARY
Glucose-Capillary: 117 mg/dL — ABNORMAL HIGH (ref 70–99)
Glucose-Capillary: 118 mg/dL — ABNORMAL HIGH (ref 70–99)
Glucose-Capillary: 123 mg/dL — ABNORMAL HIGH (ref 70–99)
Glucose-Capillary: 140 mg/dL — ABNORMAL HIGH (ref 70–99)
Glucose-Capillary: 143 mg/dL — ABNORMAL HIGH (ref 70–99)
Glucose-Capillary: 147 mg/dL — ABNORMAL HIGH (ref 70–99)

## 2023-11-20 LAB — BLOOD GAS, ARTERIAL
Acid-Base Excess: 10.1 mmol/L — ABNORMAL HIGH (ref 0.0–2.0)
Bicarbonate: 34.3 mmol/L — ABNORMAL HIGH (ref 20.0–28.0)
Drawn by: 40415
O2 Saturation: 87.1 %
Patient temperature: 37.1
pCO2 arterial: 43 mmHg (ref 32–48)
pH, Arterial: 7.51 — ABNORMAL HIGH (ref 7.35–7.45)
pO2, Arterial: 53 mmHg — ABNORMAL LOW (ref 83–108)

## 2023-11-20 LAB — CULTURE, BLOOD (ROUTINE X 2)
Culture: NO GROWTH
Culture: NO GROWTH

## 2023-11-20 LAB — BASIC METABOLIC PANEL WITH GFR
Anion gap: 11 (ref 5–15)
BUN: 7 mg/dL (ref 6–20)
CO2: 32 mmol/L (ref 22–32)
Calcium: 9.7 mg/dL (ref 8.9–10.3)
Chloride: 93 mmol/L — ABNORMAL LOW (ref 98–111)
Creatinine, Ser: 0.62 mg/dL (ref 0.61–1.24)
GFR, Estimated: >60 mL/min (ref >60)
Glucose, Bld: 106 mg/dL — ABNORMAL HIGH (ref 70–99)
Potassium: 3.5 mmol/L (ref 3.5–5.1)
Sodium: 136 mmol/L (ref 135–145)

## 2023-11-20 LAB — MAGNESIUM: Magnesium: 1.9 mg/dL (ref 1.7–2.4)

## 2023-11-20 LAB — BRAIN NATRIURETIC PEPTIDE: B Natriuretic Peptide: 45 pg/mL (ref 0.0–100.0)

## 2023-11-20 LAB — SURGICAL PATHOLOGY

## 2023-11-20 LAB — PHOSPHORUS: Phosphorus: 4 mg/dL (ref 2.5–4.6)

## 2023-11-20 MED ORDER — IPRATROPIUM-ALBUTEROL 0.5-2.5 (3) MG/3ML IN SOLN
3.0000 mL | Freq: Three times a day (TID) | RESPIRATORY_TRACT | Status: DC
  Administered 2023-11-20 – 2023-11-26 (×19): 3 mL via RESPIRATORY_TRACT
  Filled 2023-11-20 (×17): qty 3

## 2023-11-20 MED ORDER — MAGNESIUM SULFATE 2 GM/50ML IV SOLN
2.0000 g | Freq: Once | INTRAVENOUS | Status: AC
  Administered 2023-11-20: 2 g via INTRAVENOUS
  Filled 2023-11-20: qty 50

## 2023-11-20 MED ORDER — ALBUTEROL SULFATE (2.5 MG/3ML) 0.083% IN NEBU: 2.5000 mg | INHALATION_SOLUTION | RESPIRATORY_TRACT | Status: DC | PRN

## 2023-11-20 NOTE — Progress Notes (Signed)
 ENT Note: NO issues with trach but on rounds, patient was noted to be desaturating into 70s on trach collar.  Jerome Washington was NOT CHANGED as a result. I did suction the trach and suction passed easily with small amount of secretions. Good airway movement from trach. He denies SOB or symptoms.  Given this, recommend possible ABG and/or CXR -- nurse was notifying primary team.  Will plan to change tracheostomy to 6 cuffless later today if more stable    [name] B Johncarlos Holtsclaw

## 2023-11-20 NOTE — Progress Notes (Addendum)
 Speech Language Pathology Treatment: Jeanett Due Speaking valve  Patient Details Name: Jerome Washington MRN: [id number] DOB: 05-01-1970 Today's Date: 11/20/2023 Time: 9092-9077 SLP Time Calculation (min) (ACUTE ONLY): 15 min  Assessment / Plan / Recommendation Clinical Impression  Visited pt at bedside due to notes about desaturation and hold on plan for MBS. Pt alert, no distress, PMSV not in place, O2 sats around 90. Pt had wet respirations. Pt says he was told not to use his PMSV by MD. SLP placed valve to check for resulting desat with extended use. Pt able to orally expectorate secretions with valve in place, which he said felt good. SLP observed pt at rest for 10 minutes of PMSV use. Sats varied from 90-94 with a good waveform. Removed valve at end of session. Given that pt appears stable and there is concern for a potential aspiration pna on CXR SLP will proceed with MBS to investigate if Oral intake is recommended for pt over the weekend or not.   Pt agrees to keep PMSV off until trach change, but did not see any evidence that PMSV is causing desaturation. Pt seemed better able to clear secretions with PMSV in place. Pt should bring PMSV to MBS so he can be tested with it on and off.    HPI HPI: Patient is a 53 y.o. male who presented 10/28 for operative treatment of oropharynx mass. Pt underwent tracheostomy on 10/28 due to respiratory distress. 6 cuffed trach initially placed, plan for 4 cuffless eventually. ENT reports Large oropharynx mass almost 270 degrees; tongue base did not appear involved but difficult to see entirety given bulk of mass. Distal airway including epiglottis and larynx not involved. Pt has a  prior diagnosis SCC of the mouth s/p glossectomy and radical neck dissection on 10.20.20. Pt has reported recent difficulty swallowing and has been observed to be malnourished. Pt had PEG tube placed on 11/18/23 to supplement nutrition.      SLP Plan  MBS           Recommendations         Patient may use Passy-Muir Speech Valve: with SLP only PMSV Supervision: Full           Oral care BID   PRN       MBS     Eleri Ruben, Consuelo Fitch  11/20/2023, 9:27 AM

## 2023-11-20 NOTE — Progress Notes (Signed)
 Nutrition Brief Note  Discussed with MD and team during rounds. Plan is to hopefully discharge soon once tolerating enteral nutrition via G-tube as pt is high risk for refeeding. RD recommended remaining inpatient until patient is able to tolerate goal enteral feeds and condense to bolus feeds and tolerate. RN reports that she just increased him to 40 mL/hr and he has a goal of 50 mL/hr of Osmolite 1.5. Once pt is at goal and tolerating for >/= 12 hours, can consider transitioning to bolus feeds. Once starting bolus feeds, start slowly and titrate to full bolus regimen as tolerated.    INTERVENTION:  Continue advancing continuous tube feeds via PEG: Osmolite 1.5 goal rate 31ml/hr ( per day) *initiate at 20ml and advance by 10ml q10h to goal rate Provides 1800 kcal, 75g protein and free water daily   If tolerating continuous feedings at goal rate, transition to bolus feeds Administer 1/2 carton of formula for first two feeds and continue to increase by 1/2 carton every 2 feeds until goal amount is tolerated    Recommended bolus schedule once goal rate achieved and out of refeeding window: Osmolite 1.5 (237 ml) - 5 cartons daily across 4 feedings 2 cartons once daily + 1 carton TID 30 ml free water flush before and after each bolus (240 ml per day) Provides  1775 kcals, 75g protein and 1145 ml free water per day (FWF + TF)   Monitor magnesium , potassium, and phosphorus daily for at least 3 days, MD to replete as needed, as pt is at risk for refeeding syndrome given severe malnutrition and inadequate oral intake Continue Thiamine 100mg  daily x5 days Recommend swallow assessment as appropriate; SLP following for PMV trials and may require separate assessment Handout added to discharge instructions regarding care and administration of bolus feeds.     RD team will continue to follow while admitted.   Nestora Glatter RD, LDN Clinical Dietitian

## 2023-11-20 NOTE — Discharge Instructions (Addendum)
 Bolus or Syringe Tube Feeding Instructions   A feeding tube can give you nutrition and fluids when cancer or its treatments stop you from eating enough by mouth or absorbing enough nutrition through your gastrointestinal (GI) tract. This handout explains how to use your feeding tube for hydration and nutrition.  How to Flush Your Feeding Tube Even if you are not using your feeding tube for nutrition, it is important to flush it with 60 milliliters (mL) or about 2 ounces or  cup of water once a day to keep the feeding tube clean and working. This will keep a clog from forming inside the feeding tube. Supplies needed: 60-mL syringe, water, clean container, towel Wash your hands with soap and water for 20 to 30 seconds. Place a towel down on your lap under the feeding tube to catch any spills or splatters. Fill a clean container with warm or room-temperature water. Place the tip of a 60-mL syringe in the water (make sure you take the clear plastic cap off the syringe), and pull the plunger back to draw 30 to 60 mL of water into the syringe. Pinch, bend, or use the clamp at the end of the feeding tube to keep the contents from leaking out. Open the cap on the feeding tube, then insert the tip of the syringe into the feeding tube. Unpinch or unclamp the tube and gently push the plunger of the syringe down to allow the water to flow into the tube When the syringe is empty, pinch or bend the tube to keep the contents from leaking out. Remove the syringe from the tube and close the cap on the feeding tube.  Basic Tips for Using a Feeding Tube  - Always wash your hands with soap and water for 20 to 30 seconds before touching the feeding tube and giving feedings.  - Always flush the feeding tube with water before and after each feeding and each medication.  - Keep your head and chest raised higher than your stomach when feeding. Sit at about a 45-degree angle. Do not lie down flat. Avoid lying down for 30  to 60 minutes after a feeding.   How to Clean Around Your Feeding Tube  Follow these directions every day to clean around your feeding tube:  Wash your hands with mild soap and water and dry them with a clean towel.  Wet a soft, clean cloth or gauze with warm, mild, soapy water.  Gently wipe the skin around the tube with the cloth. Also clean the base of the tube.  Carefully clean the skin under the bumper with the cloth. Do not pull on the feeding tube.  Look for redness, swelling, bleeding, or leakage around the tube. If you notice any of these things, report them to your health care team immediately. Do not wait another day.  Rinse the area you cleaned with clear, warm water. (It is also safe to rinse off in the shower.)  Pat the area dry with another clean, soft cloth.  Apply a protective skin barrier or antibacterial ointment to the area around the tube if you have been told to do so by your health care team.  Gently push the base of the tube against the skin and twist the tube in a circle. This step keeps the tube from sticking to the inside of the stomach.  When you are finished, wash your hands again with mild soap and water and dry them with a clean towel.   How  to Give Bolus (Syringe) Tube Feedings  Bolus (syringe) tube feedings are usually given multiple times a day, several hours apart. Generally, they are used for feeding tubes placed into the stomach (G-tubes). Bolus tube feeds should not be used for feeding tubes placed into the small intestine. This will cause GI intolerance, such as nausea, bloating, or diarrhea.  Supplies needed: 60-mL syringe, water, formula, clean container, towel  Follow these steps for a bolus tube feeding:  Wash your hands with mild soap and water for 20 to 30 seconds.  Place a towel down on your lap under the feeding tube to catch any spills or splatters.  Gently shake the room-temperature formula container, then open the can or carton of formula you need  for the feeding.  Flush the feeding tube with at least 30 mL of warm water before the feeding. (See the How to Flush Your Feeding Tube section for instructions.)  Remove the plunger from the syringe and reinsert the syringe tip into the feeding tube.  Keep the syringe in the feeding tube and pour formula into the syringe until the syringe is halfway full (about 2 ounces or 20 mL). Allow the syringe contents to flow into the feeding tube. Continue filling the syringe halfway with formula until the amount prescribed for the feeding has been given. Go slowly: It should take about 15 to 20 minutes for one carton (8 ounces) of formula.  Once the feeding is finished, flush the tube with at least 30 mL of warm water.  Pinch the tube, remove the syringe, and close the cap on the feeding tube.  Tape the tube to your stomach with medical paper tape.  Cover and store opened containers of leftover formula, if any, in the refrigerator to prevent contamination or spoilage. Before the next feeding, allow the refrigerated formula to warm up to room temperature before using it for another bolus tube feeding.      Follow with Primary MD Jerome Washington, Jerome Elizabeth, NP in 7 days   Get CBC, CMP, Magnesium , 2 view Chest X ray -  checked next visit with your primary MD    Activity: As tolerated with Full fall precautions use walker/cane & assistance as needed  Disposition Home    Diet: N.p.o. by mouth.  All medications and diet via the PEG tube  Special Instructions: If you have smoked or chewed Tobacco  in the last 2 yrs please stop smoking, stop any regular Alcohol  and or any Recreational drug use.  On your next visit with your primary care physician please Get Medicines reviewed and adjusted.  Please request your Prim.MD to go over all Hospital Tests and Procedure/Radiological results at the follow up, please get all Hospital records sent to your Prim MD by signing hospital release before you go home.  If  you experience worsening of your admission symptoms, develop shortness of breath, life threatening emergency, suicidal or homicidal thoughts you must seek medical attention immediately by calling 911 or calling your MD immediately  if symptoms less severe.  You Must read complete instructions/literature along with all the possible adverse reactions/side effects for all the Medicines you take and that have been prescribed to you. Take any new Medicines after you have completely understood and accpet all the possible adverse reactions/side effects.   Do not drive when taking Pain medications.  Do not take more than prescribed Pain, Sleep and Anxiety Medications  Wear Seat belts while driving.

## 2023-11-20 NOTE — Progress Notes (Signed)
 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 ENT subsequently came in and changed out the patient's trach.  Oxygen saturation improving, although he does still remain mildly hypoxic, most recently with oxygen saturations in the high 80s on 50% FiO2 via trach collar.  Patient completely asymptomatic at this time, including no shortness of breath.  Additional vital signs appear stable, including afebrile, with respiratory rate in the low 20s, heart rates in the 90s with most recent blood pressure 111/73.  Morning labs notable for phosphorus level 4.0, magnesium  1.9.  I subsequently ordered stat chest x-ray, ABG, bnp, procalcitonin level, EKG, and ordered 2 g of IV magnesium  sulfate to be administered over 2 hours.  Additionally, the patient is on continuous tube feeds running at 30 cc/h.  Will hold these tube feeds for now.    Eva Pore, DO Hospitalist

## 2023-11-20 NOTE — Procedures (Signed)
 ENT Procedure Note: Trach changed from 6 cuffed Shiley to 6 uncuffed. Easy change, patient tolerated it well. Good voice.  He is ok to go home once supplies are delivered from ENT standpoint and teaching for trach and G-tube is done. If he is inpatient and his path results are back, I will speak with him during the week about the results. Page ENT with questions [name]

## 2023-11-20 NOTE — Progress Notes (Signed)
 SLP Cancellation Note  Patient Details Name: Jerome Washington MRN: [id number] DOB: 1970-12-05   Cancelled treatment:       Reason Eval/Treat Not Completed: Medical issues which prohibited therapy. Attempted to bring pt to [facility], Pt could not tolerate activity with desaturation. No PO given, RT to bedside to safely assist pt back. Keep pt NPO over the weekend. Will f/u Monday for ability to tolerate swallowing testing.    Ayodele Hartsock, Consuelo Fitch 11/20/2023, 2:01 PM

## 2023-11-20 NOTE — Progress Notes (Signed)
 PROGRESS NOTE        PATIENT DETAILS Name: Jerome Washington Age: 53 y.o. Sex: male Date of Birth: 17-Apr-1970 Admit Date: 11/17/2023 Admitting Physician Lonna Coder, MD ERE:[id number], [name], NP  Brief Summary: Patient is a 53 y.o.  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  Subsequently admitted to the ICU postoperatively-stabilized and then transferred to [facility].   Significant events: 10/28>> admit by ENT-tracheostomy/biopsy-transferred to ICU 10/29>> transferred to [facility] 10/30>> G-tube by IR  Significant studies: 10/26>> CT soft tissue neck: 6.3 x 3.2 x 4.3 mass in the posterior oropharynx-enlarged right posterior lateral retropharyngeal lymph node.  Significant microbiology data: None  Procedures: 10/28>> tracheotomy-laryngoscopy with biopsy. 10/30>> G-tube by IR  Consults: PCCM ENT  Subjective: Feels fine this morning-not short of breath-did not have a trach collar when I was in the room-O2 saturations were in the 90s.  Objective: Vitals: Blood pressure 120/75, pulse 91, temperature 98.4 F (36.9 C), temperature source Oral, resp. rate 18, height 5' 11 (1.803 m), weight 51.6 kg, SpO2 97%.   Exam: Gen Exam:Alert awake-not in any distress HEENT:atraumatic, normocephalic Chest: B/L clear to auscultation anteriorly CVS:S1S2 regular Abdomen:soft non tender, non distended Extremities:no edema Neurology: Non focal Skin: no rash  Pertinent Labs/Radiology:    Latest Ref Rng & Units 11/20/2023    3:15 AM 11/19/2023    2:14 AM 11/15/2023   11:11 AM  CBC  WBC 4.0 - 10.5 K/uL 8.7  9.8  11.4   Hemoglobin 13.0 - 17.0 g/dL 87.1  88.5  85.3   Hematocrit 39.0 - 52.0 % 38.7  35.1  44.3   Platelets 150 - 400 K/uL 346   321  379     Lab Results  Component Value Date   NA 136 11/20/2023   K 3.5 11/20/2023   CL 93 (L) 11/20/2023   CO2 32 11/20/2023      Assessment/Plan: 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  Stable this morning-lungs clear-CXR without pneumonia.  BNP not elevated. G-tube placed by IR on 10/30 G-tube feeds still not at [facility] he is at goal-he will need to be switched to bolus feeds in anticipation of discharge.   At risk for refeeding syndrome so needs to remain inpatient for another day or so before we can contemplate discharge.  Hypophosphatemia/hypomagnesemia Stable today At risk for refeeding syndrome-follow phosphorus levels for a few more days.  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 (Active)    Underweight: Estimated body mass index is 15.87 kg/m as calculated from the following:   Height  as of this encounter: 5' 11 (1.803 m).   Weight as of this encounter: 51.6 kg.   Code status:   Code Status: Full Code   DVT Prophylaxis: heparin injection 5,000 Units Start: 11/18/23 1330 SCDs Start: 11/17/23 1828   Family Communication: None at bedside   Disposition Plan: Status is:  Inpatient Remains inpatient appropriate because: Severity of illness   Planned Discharge Destination:Home   Diet: Diet Order             Diet NPO time specified  Diet effective midnight                     Antimicrobial agents: Anti-infectives (From admission, onward)    Start     Dose/Rate Route Frequency Ordered Stop   11/19/23 0600  ceFAZolin (ANCEF) IVPB 2g/100 mL premix        2 g 200 mL/hr over 30 Minutes Intravenous To Radiology 11/18/23 1544 11/19/23 0859        MEDICATIONS: Scheduled Meds:  chlorhexidine  15 mL Mouth/Throat Once   Chlorhexidine Gluconate Cloth  6 each Topical Daily   heparin injection (subcutaneous)  5,000 Units Subcutaneous Q12H   ipratropium-albuterol  3 mL Nebulization TID   mouth rinse  15 mL Mouth Rinse 4 times per day   thiamine  100 mg Per Tube Daily   Continuous Infusions:  feeding supplement (OSMOLITE 1.5 CAL) 50 mL/hr at 11/20/23 0852   lactated ringers 10 mL/hr at 11/17/23 1626   PRN Meds:.albuterol, docusate sodium, fentaNYL  (SUBLIMAZE ) injection, mouth rinse, polyethylene glycol   I have personally reviewed following labs and imaging studies  LABORATORY DATA: CBC: Recent Labs  Lab 11/15/23 1111 11/19/23 0214 11/20/23 0315  WBC 11.4* 9.8 8.7  NEUTROABS 9.1*  --   --   HGB 14.6 11.4* 12.8*  HCT 44.3 35.1* 38.7*  MCV 103.3* 102.0* 100.5*  PLT 379 321 346    Basic Metabolic Panel: Recent Labs  Lab 11/15/23 1111 11/19/23 0214 11/20/23 0315  NA 135 132* 136  K 4.3 3.8 3.5  CL 92* 93* 93*  CO2 34* 30 32  GLUCOSE 85 110* 106*  BUN 8 6 7   CREATININE 0.47* 0.49* 0.62  CALCIUM 10.3 9.3 9.7  MG  --  1.6* 1.9  PHOS  --  2.2* 4.0    GFR: Estimated Creatinine Clearance: 80.6 mL/min (by C-G formula based on SCr of 0.62 mg/dL).  Liver Function Tests: Recent Labs  Lab 11/15/23 1111 11/19/23 0214  AST 22 17  ALT 6 8  ALKPHOS 121 57  BILITOT 0.5 0.9  PROT 7.4 5.8*  ALBUMIN 4.0 2.6*   No results for  input(s): LIPASE, AMYLASE in the last 168 hours. No results for input(s): AMMONIA in the last 168 hours.  Coagulation Profile: Recent Labs  Lab 11/19/23 0214  INR 0.9    Cardiac Enzymes: No results for input(s): CKTOTAL, CKMB, CKMBINDEX, TROPONINI in the last 168 hours.  BNP (last 3 results) No results for input(s): PROBNP in the last 8760 hours.  Lipid Profile: No results for input(s): CHOL, HDL, LDLCALC, TRIG, CHOLHDL, LDLDIRECT in the last 72 hours.  Thyroid Function Tests: No results for input(s): TSH, T4TOTAL, FREET4, T3FREE, THYROIDAB in the last 72 hours.  Anemia Panel: No results for input(s): VITAMINB12, FOLATE, FERRITIN, TIBC, IRON, RETICCTPCT in the last 72 hours.  Urine analysis:    Component Value Date/Time   COLORURINE YELLOW 08/17/2022 1230   APPEARANCEUR CLEAR 08/17/2022 1230   LABSPEC 1.008  08/17/2022 1230   PHURINE 5.0 08/17/2022 1230   GLUCOSEU NEGATIVE 08/17/2022 1230   HGBUR NEGATIVE 08/17/2022 1230   BILIRUBINUR NEGATIVE 08/17/2022 1230   KETONESUR NEGATIVE 08/17/2022 1230   PROTEINUR NEGATIVE 08/17/2022 1230   UROBILINOGEN 0.2 05/30/2011 1345   NITRITE NEGATIVE 08/17/2022 1230   LEUKOCYTESUR NEGATIVE 08/17/2022 1230    Sepsis Labs: Lactic Acid, Venous    Component Value Date/Time   LATICACIDVEN 0.8 11/15/2023 1309    MICROBIOLOGY: Recent Results (from the past 240 hours)  Resp panel by RT-PCR (RSV, Flu A&B, Covid) Anterior Nasal Swab     Status: None   Collection Time: 11/15/23 11:11 AM   Specimen: Anterior Nasal Swab  Result Value Ref Range Status   SARS Coronavirus 2 by RT PCR NEGATIVE NEGATIVE Final    Comment: (NOTE) SARS-CoV-2 target nucleic acids are NOT DETECTED.  The SARS-CoV-2 RNA is generally detectable in upper respiratory specimens during the acute phase of infection. The lowest concentration of SARS-CoV-2 viral copies this assay can detect is 138 copies/mL. A negative  result does not preclude SARS-Cov-2 infection and should not be used as the sole basis for treatment or other patient management decisions. A negative result may occur with  improper specimen collection/handling, submission of specimen other than nasopharyngeal swab, presence of viral mutation(s) within the areas targeted by this assay, and inadequate number of viral copies(<138 copies/mL). A negative result must be combined with clinical observations, patient history, and epidemiological information. The expected result is Negative.  Fact Sheet for Patients:  bloggercourse.com  Fact Sheet for Healthcare Providers:  seriousbroker.it  This test is no t yet approved or cleared by the [place]  FDA and  has been authorized for detection and/or diagnosis of SARS-CoV-2 by FDA under an Emergency Use Authorization (EUA). This EUA will remain  in effect (meaning this test can be used) for the duration of the COVID-19 declaration under Section 564(b)(1) of the Act, 21 U.S.C.section 360bbb-3(b)(1), unless the authorization is terminated  or revoked sooner.       Influenza A by PCR NEGATIVE NEGATIVE Final   Influenza B by PCR NEGATIVE NEGATIVE Final    Comment: (NOTE) The Xpert Xpress SARS-CoV-2/FLU/RSV plus assay is intended as an aid in the diagnosis of influenza from Nasopharyngeal swab specimens and should not be used as a sole basis for treatment. Nasal washings and aspirates are unacceptable for Xpert Xpress SARS-CoV-2/FLU/RSV testing.  Fact Sheet for Patients: bloggercourse.com  Fact Sheet for Healthcare Providers: seriousbroker.it  This test is not yet approved or cleared by the [place]  FDA and has been authorized for detection and/or diagnosis of SARS-CoV-2 by FDA under an Emergency Use Authorization (EUA). This EUA will remain in effect (meaning this test can be used)  for the duration of the COVID-19 declaration under Section 564(b)(1) of the Act, 21 U.S.C. section 360bbb-3(b)(1), unless the authorization is terminated or revoked.     Resp Syncytial Virus by PCR NEGATIVE NEGATIVE Final    Comment: (NOTE) Fact Sheet for Patients: bloggercourse.com  Fact Sheet for Healthcare Providers: seriousbroker.it  This test is not yet approved or cleared by the [place]  FDA and has been authorized for detection and/or diagnosis of SARS-CoV-2 by FDA under an Emergency Use Authorization (EUA). This EUA will remain in effect (meaning this test can be used) for the duration of the COVID-19 declaration under Section 564(b)(1) of the Act, 21 U.S.C. section 360bbb-3(b)(1), unless the authorization is terminated or revoked.  Performed at [facility], [address] [place], [place] [address]   Group A Strep by PCR     Status: None   Collection Time: 11/15/23 11:23 AM   Specimen: Anterior Nasal Swab; Sterile Swab  Result Value Ref Range Status   Group A Strep by PCR NOT DETECTED NOT DETECTED Final    Comment: Performed at [facility], [address], [place], [place] [address]  Blood Culture (routine x 2)     Status: None   Collection Time: 11/15/23 12:24 PM   Specimen: Right Antecubital; Blood  Result Value Ref Range Status   Specimen Description   Final    RIGHT ANTECUBITAL BOTTLES DRAWN AEROBIC AND ANAEROBIC   Special Requests   Final    Blood Culture results may not be optimal due to an inadequate volume of blood received in culture bottles   Culture   Final    NO GROWTH 5 DAYS Performed at [facility], [address], [place], [place] [address]    Report Status 11/20/2023 FINAL  Final  Blood Culture (routine x 2)     Status: None   Collection Time: 11/15/23 12:27 PM   Specimen: BLOOD RIGHT HAND  Result Value Ref Range Status   Specimen Description BLOOD RIGHT HAND AEROBIC BOTTLE ONLY  Final    Special Requests   Final    Blood Culture results may not be optimal due to an inadequate volume of blood received in culture bottles   Culture   Final    NO GROWTH 5 DAYS Performed at [facility], [address] [place], [place] [address]    Report Status 11/20/2023 FINAL  Final  MRSA Next Gen by PCR, Nasal     Status: None   Collection Time: 11/17/23  6:28 PM   Specimen: Nasal Mucosa; Nasal Swab  Result Value Ref Range Status   MRSA by PCR Next Gen NOT DETECTED NOT DETECTED Final    Comment: (NOTE) The GeneXpert MRSA Assay (FDA approved for NASAL specimens only), is one component of a comprehensive MRSA colonization surveillance program. It is not intended to diagnose MRSA infection nor to guide or monitor treatment for MRSA infections. Test performance is not FDA approved in patients less than 6 years old. Performed at [facility], [address] [place], [place] [address]     RADIOLOGY STUDIES/RESULTS: DG Chest Port 1 View Result Date: 11/20/2023 EXAM: 1 VIEW(S) XRAY OF THE CHEST 11/20/2023 07:41:00 AM COMPARISON: 11/17/2023 CLINICAL HISTORY: 200808 Hypoxia 200808 FINDINGS: LINES, TUBES AND DEVICES: Tracheostomy tube in place. LUNGS AND PLEURA: Elevated left hemidiaphragm. Mild patchy left lung base opacity, slightly increased. No pulmonary edema. No pleural effusion. No pneumothorax. HEART AND MEDIASTINUM: No acute abnormality of the cardiac and mediastinal silhouettes. BONES AND SOFT TISSUES: Similar mild subcutaneous emphysema in right supraclavicular region. No acute osseous abnormality. IMPRESSION: 1. Mildly increased left basilar opacity, which may reflect atelectasis or aspiration/pneumonia. 2. Elevated left hemidiaphragm. 3. Tracheostomy tube in place. 4. Similar mild subcutaneous emphysema in the right supraclavicular region. Electronically signed by: Selinda Blue MD 11/20/2023 08:36 AM EDT RP Workstation: [id number]   IR GASTROSTOMY TUBE MOD SED Result Date:  11/19/2023 INDICATION: 53 year old male with history of head neck cancer requiring percutaneous enteric access. EXAM: PERC PLACEMENT GASTROSTOMY MEDICATIONS: Ancef 2 gm IV; Antibiotics were administered within 1 hour of the procedure. ANESTHESIA/SEDATION: Versed 2 mg IV; Fentanyl  100 mcg IV Moderate Sedation Time:  10 The patient was continuously monitored during the procedure by the interventional radiology nurse under my direct  supervision. CONTRAST:  15mL OMNIPAQUE  IOHEXOL  300 MG/ML SOLN - administered into the gastric lumen. FLUOROSCOPY TIME:  Twelve mGy reference air kerma COMPLICATIONS: None immediate. PROCEDURE: Informed written consent was obtained from the patient after a thorough discussion of the procedural risks, benefits and alternatives. All questions were addressed. Maximal Sterile barrier Technique was utilized including caps, mask, sterile gowns, sterile gloves, sterile drape, hand hygiene and skin antiseptic. A timeout was performed prior to the initiation of the procedure. The patient was placed on the procedure table in the supine position. Pre-procedure abdominal film confirmed visualization of the transverse colon. An angled 5-French catheter was passed through the nares into the stomach. The patient was prepped and draped in usual sterile fashion. The stomach was insufflated with air via the indwelling nasogastric tube. Under fluoroscopy, a puncture site was selected and local analgesia achieved with 1% lidocaine  infiltrated subcutaneously. Under fluoroscopic guidance, a gastropexy needle was passed into the stomach and the T-bar suture was released. Entry into the stomach was confirmed with fluoroscopy, aspiration of air, and injection of contrast material. This was repeated with an additional gastropexy suture (for a total of 2 fasteners). At the center of these gastropexy sutures, a dermatotomy was performed. An 18 gauge needle was passed into the stomach at the site of this dermatotomy,  and position within the gastric lumen again confirmed under fluoroscopy using aspiration of air and contrast injection. An Amplatz guidewire was passed through this needle and intraluminal placement within the stomach was confirmed by fluoroscopy. The needle was removed. Over the guidewire, the percutaneous tract was dilated using a 10 mm non-compliant balloon. The balloon was deflated, then pushed into the gastric lumen followed in concert by the 20 Fr gastrostomy tube. The retention balloon of the percutaneous gastrostomy tube was inflated with 10 mL of sterile water. The tube was withdrawn until the retention balloon was at the edge of the gastric lumen. The external bumper was brought to the abdominal wall. Contrast was injected through the gastrostomy tube, confirming intraluminal positioning. The patient tolerated the procedure well without any immediate post-procedural complications. IMPRESSION: Technically successful placement of 20 Fr gastrostomy tube. Ester Sides, MD [facility] Radiology Electronically Signed   By: Ester Sides M.D.   On: 11/19/2023 16:22   CT ABDOMEN WO CONTRAST Result Date: 11/18/2023 CLINICAL DATA:  53 year old male, preprocedure planning for percutaneous gastrostomy tube placement. EXAM: CT ABDOMEN WITHOUT CONTRAST TECHNIQUE: Multidetector CT imaging of the abdomen was performed following the standard protocol without IV contrast. RADIATION DOSE REDUCTION: This exam was performed according to the departmental dose-optimization program which includes automated exposure control, adjustment of the mA and/or kV according to patient size and/or use of iterative reconstruction technique. COMPARISON:  08/17/2022 FINDINGS: Lower chest: No acute abnormality. Hepatobiliary: No focal liver abnormality is seen. No gallstones, gallbladder wall thickening, or biliary dilatation. Pancreas: Unremarkable. No pancreatic ductal dilatation or  surrounding inflammatory changes. Spleen: Normal in size without focal abnormality. Adrenals/Urinary Tract: Adrenal glands are unremarkable. Punctate left inferior pole nonobstructive renal calculus. The kidneys are otherwise normal. Stomach/Bowel: Stomach is within normal limits. Appendix appears normal. No evidence of bowel wall thickening, distention, or inflammatory changes. Vascular/Lymphatic: Aortic atherosclerosis. No enlarged abdominal lymph nodes. Other: No abdominal wall hernia or abnormality. Musculoskeletal: No acute or significant osseous findings. IMPRESSION: 1. Favorable anatomy for percutaneous gastrostomy tube placement. 2.  Aortic Atherosclerosis (ICD10-I70.0). Ester Sides, MD [facility] Radiology Electronically Signed   By: Ester  Suttle M.D.   On: 11/18/2023 15:04     LOS: 3 days   Donalda Applebaum, MD  [facility]    To contact the attending provider between 7A-7P or the covering provider during after hours 7P-7A, please log into the web site [email or web address] and access using universal [place] password for that web site. If you do not have the password, please call the hospital operator.  11/20/2023, 10:52 AM

## 2023-11-20 NOTE — Plan of Care (Signed)

## 2023-11-20 NOTE — TOC Initial Note (Addendum)
 Transition of Care [facility]) - Initial/Assessment Note    Patient Details  Name: Jerome Washington MRN: [id number] Date of Birth: Jun 17, 1970  Transition of [facility]) CM/SW Contact:    [name], RN Phone Number: 11/20/2023, 12:14 PM  Clinical Narrative:                  Spoke to patient at the bedside. He is a new trach and Gtube.   DME Will need trach supplies needs orders [facility] able to accept for trach set up. Awaiting orders, requested from MD, will likely take until Tuesday to get supplies to the house. Spoke w [place] at [phone] [facility] Department not available till Monday Hospital Bed- order placed- will need ordered closer to DC   Will need tube feeding supplies- goal for bolus feeds, will be here over the weekend. Referral made to [facility] w [employer]. Will need orders once bolus feed regimen is established.  Patient lives with his mother who states will be able to come to hospital before DC to learn trach care and tube feeding.  Consult to trach team placed.   Home Health has many barriers to being established, including payor source, area of service, and level of care  [employer] agencies that do not take new trachs: [facility]  [facility]- out of area [facility]- does not take [employer]- does not take insurance   *Spoke w Myer Moellers [phone] fax/[phone] of [name] personal care services division and scanned him demographics and records. He will submit to insurance for home nursing care. Insurance may not result for up to two weeks, however a hospital discharge before determination is made by insurance will NOT cancell the request; patient may DC to home if support is established before insurance determination is made at the discretion of the attending.    Expected Discharge Plan: Home w Home Health Services Barriers to Discharge: Continued Medical Work up   Patient Goals and CMS Choice Patient states their goals for  this hospitalization and ongoing recovery are:: to go home CMS Medicare.gov Compare Post Acute Care list provided to:: Patient Choice offered to / list presented to : Patient      Expected Discharge Plan and Services   Discharge Planning Services: CM Consult Post Acute Care Choice: Durable Medical Equipment, Home Health Living arrangements for the past 2 months: Single Family Home                 DME Arranged: Tube feeding   Date DME Agency Contacted: 11/20/23 Time DME Agency Contacted: 1214 Representative spoke with at DME Agency: [name]            Prior Living Arrangements/Services Living arrangements for the past 2 months: Single Family Home Lives with:: Parents (mother) Patient language and need for interpreter reviewed:: Yes Do you feel safe going back to the place where you live?: Yes      Need for Family Participation in Patient Care: Yes (Comment) Care giver support system in place?: Yes (comment)   Criminal Activity/Legal Involvement Pertinent to Current Situation/Hospitalization: No - Comment as needed  Activities of Daily Living   ADL Screening (condition at time of admission) Independently performs ADLs?: Yes (appropriate for developmental age) Is the patient deaf or have difficulty hearing?: No Does the patient have difficulty seeing, even when wearing glasses/contacts?: No Does the patient have difficulty concentrating, remembering, or making decisions?: No  Permission Sought/Granted  Emotional Assessment Appearance:: Appears stated age Attitude/Demeanor/Rapport: Gracious Affect (typically observed): Accepting Orientation: : Oriented to Self, Oriented to Place, Oriented to  Time, Oriented to Situation Alcohol / Substance Use: Not Applicable Psych Involvement: No (comment)  Admission diagnosis:  Mass of oropharynx [J39.2] Status post tracheostomy (HCC) [Z93.0] 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] Patient Active Problem List    Diagnosis Date Noted   Protein-calorie malnutrition, severe 11/18/2023   Status post tracheostomy (HCC) 11/17/2023   Mass of oropharynx 11/17/2023   Respiratory distress 11/17/2023   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   Abnormal finding on evaluation procedure 07/08/2021   Degeneration of lumbar intervertebral disc 06/03/2021   Essential hypertension 06/03/2021   Gastroesophageal reflux disease without esophagitis 06/03/2021   Hardening of the aorta (main artery of the heart) 06/03/2021   Insomnia 06/03/2021   Lumbago with sciatica 06/03/2021   Other specified disorders of bone density and structure, unspecified site 06/03/2021   Polyneuropathy 06/03/2021   Spondylolisthesis 06/03/2021   Tobacco dependence due to cigarettes 06/03/2021   History of MI (myocardial infarction) 05/31/2019   Tobacco use 05/31/2019   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 [place] [address] Phone: [phone] Fax: [phone]  [facility] - [place], [place] - [address] [place] [address] Phone: [phone] Fax: [phone]     Social Drivers of Health (SDOH) Social History: SDOH Screenings   Food Insecurity: No Food Insecurity (11/18/2023)  Housing: Low Risk  (11/18/2023)  Transportation Needs: No Transportation Needs (11/18/2023)  Utilities: Not At Risk (11/18/2023)  Tobacco Use: High Risk (11/17/2023)   SDOH Interventions:     Readmission Risk Interventions     No data to display

## 2023-11-21 ENCOUNTER — Inpatient Hospital Stay (HOSPITAL_COMMUNITY)

## 2023-11-21 DIAGNOSIS — Z930 Tracheostomy status: Secondary | ICD-10-CM | POA: Diagnosis not present

## 2023-11-21 LAB — CBC WITH DIFFERENTIAL/PLATELET
Abs Immature Granulocytes: 0.04 K/uL (ref 0.00–0.07)
Basophils Absolute: 0 K/uL (ref 0.0–0.1)
Basophils Relative: 0 %
Eosinophils Absolute: 0 K/uL (ref 0.0–0.5)
Eosinophils Relative: 0 %
HCT: 36.7 % — ABNORMAL LOW (ref 39.0–52.0)
Hemoglobin: 12.3 g/dL — ABNORMAL LOW (ref 13.0–17.0)
Immature Granulocytes: 0 %
Lymphocytes Relative: 7 %
Lymphs Abs: 0.9 K/uL (ref 0.7–4.0)
MCH: 33.2 pg (ref 26.0–34.0)
MCHC: 33.5 g/dL (ref 30.0–36.0)
MCV: 99.2 fL (ref 80.0–100.0)
Monocytes Absolute: 1.3 K/uL — ABNORMAL HIGH (ref 0.1–1.0)
Monocytes Relative: 11 %
Neutro Abs: 9.7 K/uL — ABNORMAL HIGH (ref 1.7–7.7)
Neutrophils Relative %: 82 %
Platelets: 344 K/uL (ref 150–400)
RBC: 3.7 MIL/uL — ABNORMAL LOW (ref 4.22–5.81)
RDW: 11.9 % (ref 11.5–15.5)
WBC: 11.9 K/uL — ABNORMAL HIGH (ref 4.0–10.5)
nRBC: 0 % (ref 0.0–0.2)

## 2023-11-21 LAB — C-REACTIVE PROTEIN: CRP: 3.7 mg/dL — ABNORMAL HIGH (ref <1.0)

## 2023-11-21 LAB — BASIC METABOLIC PANEL WITH GFR
Anion gap: 12 (ref 5–15)
BUN: 13 mg/dL (ref 6–20)
CO2: 29 mmol/L (ref 22–32)
Calcium: 10.5 mg/dL — ABNORMAL HIGH (ref 8.9–10.3)
Chloride: 94 mmol/L — ABNORMAL LOW (ref 98–111)
Creatinine, Ser: 0.64 mg/dL (ref 0.61–1.24)
GFR, Estimated: >60 mL/min (ref >60)
Glucose, Bld: 143 mg/dL — ABNORMAL HIGH (ref 70–99)
Potassium: 3.6 mmol/L (ref 3.5–5.1)
Sodium: 135 mmol/L (ref 135–145)

## 2023-11-21 LAB — PROCALCITONIN: Procalcitonin: <0.1 ng/mL

## 2023-11-21 LAB — GLUCOSE, CAPILLARY
Glucose-Capillary: 107 mg/dL — ABNORMAL HIGH (ref 70–99)
Glucose-Capillary: 121 mg/dL — ABNORMAL HIGH (ref 70–99)
Glucose-Capillary: 124 mg/dL — ABNORMAL HIGH (ref 70–99)
Glucose-Capillary: 125 mg/dL — ABNORMAL HIGH (ref 70–99)
Glucose-Capillary: 137 mg/dL — ABNORMAL HIGH (ref 70–99)
Glucose-Capillary: 138 mg/dL — ABNORMAL HIGH (ref 70–99)

## 2023-11-21 LAB — PHOSPHORUS: Phosphorus: 4.3 mg/dL (ref 2.5–4.6)

## 2023-11-21 LAB — MAGNESIUM: Magnesium: 2 mg/dL (ref 1.7–2.4)

## 2023-11-21 MED ORDER — VANCOMYCIN HCL IN DEXTROSE 1-5 GM/200ML-% IV SOLN
1000.0000 mg | Freq: Once | INTRAVENOUS | Status: AC
  Administered 2023-11-21: 1000 mg via INTRAVENOUS
  Filled 2023-11-21: qty 200

## 2023-11-21 MED ORDER — METOCLOPRAMIDE HCL 5 MG/ML IJ SOLN
5.0000 mg | INTRAMUSCULAR | Status: AC
  Administered 2023-11-21: 5 mg via INTRAVENOUS
  Filled 2023-11-21: qty 2

## 2023-11-21 MED ORDER — KETOROLAC TROMETHAMINE 15 MG/ML IJ SOLN
15.0000 mg | INTRAMUSCULAR | Status: AC
  Administered 2023-11-21: 15 mg via INTRAVENOUS
  Filled 2023-11-21: qty 1

## 2023-11-21 MED ORDER — DIPHENHYDRAMINE HCL 50 MG/ML IJ SOLN
25.0000 mg | INTRAMUSCULAR | Status: AC
  Administered 2023-11-21: 25 mg via INTRAVENOUS
  Filled 2023-11-21: qty 1

## 2023-11-21 MED ORDER — SODIUM CHLORIDE 0.9 % IV SOLN
2.0000 g | Freq: Three times a day (TID) | INTRAVENOUS | Status: DC
  Administered 2023-11-21 (×2): 2 g via INTRAVENOUS
  Filled 2023-11-21 (×3): qty 12.5

## 2023-11-21 MED ORDER — VANCOMYCIN HCL 750 MG/150ML IV SOLN
750.0000 mg | Freq: Two times a day (BID) | INTRAVENOUS | Status: DC
  Administered 2023-11-21: 750 mg via INTRAVENOUS
  Filled 2023-11-21 (×2): qty 150

## 2023-11-21 MED ORDER — SODIUM CHLORIDE 0.9 % IV SOLN
3.0000 g | Freq: Four times a day (QID) | INTRAVENOUS | Status: AC
  Administered 2023-11-21 – 2023-11-25 (×17): 3 g via INTRAVENOUS
  Filled 2023-11-21 (×18): qty 8

## 2023-11-21 MED ORDER — LACTATED RINGERS IV SOLN: INTRAVENOUS | Status: DC

## 2023-11-21 MED ORDER — OSMOLITE 1.5 CAL PO LIQD
1000.0000 mL | ORAL | Status: DC
  Administered 2023-11-21: 1000 mL
  Filled 2023-11-21 (×6): qty 1000

## 2023-11-21 NOTE — Progress Notes (Signed)
 RT went in to assess patient and RN was in the room with him, changing his pusle ox. Sat in the 70's. Pt was feeliing SOB and had called for help. I bagged/lavaged him several times and was unable to get sat above 80%. Called RRT. After more suctioning pt was placed back on HHF to trach 60L 100%. Sat did not resolve right away, RRT called for xray and also for MD, Dr Shona came to bedside to assess and he was up to 96% at this time. But this took approx 30 minutes for his sat to come up. Also, during this time he repositioned himself in bed with his left lung up and arm above head. This may be the factor that changed his oxygenation. He went up to 96% pretty quickly after repositioning. Dr Shona ordering antibiotics as CXR showed mild infiltrates. Will cont to monitor.

## 2023-11-21 NOTE — Significant Event (Signed)
 Rapid Response Event Note   Reason for Call :  Called by RT d/t SpO2-66% on 40L 40% HHFNC(thru trach).  On RRT arrival, RT bagging and lavaging pt, SpO2-80%.  Initial Focused Assessment:  Pt lying in bed with eyes open. He is alert and oriented, c/o SOB/HA and being hot. His is tachypneic with mildly increased WOB. Lungs equal with rhonchi t/o. Skin warm/dry.   HR-110, BP-110/72, RR-28, SpO2-81% being bagged on 100% via ambubag(confirmed with placing oxygen probe on different location)  Moderate amount thick tan secretions suctioned out of trach. EtCO2 placed on trach to confirm placement.  PCXR done.  After PCXR done, pt turned himself to R side. SpO2 increased to 96%.   Interventions:  Bagged/lavaged>HHFNC thru trach PCXR-1. Increased left lower lobe airspace opacity since prior study. Appearance concerning for pneumonia. 2. Resolved right supraclavicular subcutaneous emphysema. Migraine cocktail-toradol /reglan/benadryl Abx-maxipime/vanco Plan of Care:  SpO2 better after interventions. Abx started for questionable pna. Continue to monitor pt closely. Please call RRT if further assistance needed.   Event Summary:   MD Notified: Dr. Shona notified and came to bedside.  Call Time:2345 Arrival Time:2336 End Upfz:9948  Tish Graeme Piety, RN

## 2023-11-21 NOTE — Plan of Care (Signed)

## 2023-11-21 NOTE — Progress Notes (Signed)
 Pharmacy Antibiotic Note  Jerome Washington is a 53 y.o. male with oropharyngeal mass s/p trach 10/28 with pneumonia.  Pharmacy has been consulted for Vancomycin and Cefepime  dosing.  Plan: Vancomycin 1000 mg IV now then 750 mg IV q12h Cefepime 2 g IV q8h  Height: 5' 11 (180.3 cm) Weight: 51.6 kg (113 lb 12.1 oz) IBW/kg (Calculated) : 75.3  Temp (24hrs), Avg:98.6 F (37 C), Min:98 F (36.7 C), Max:99.7 F (37.6 C)  Recent Labs  Lab 11/15/23 1111 11/15/23 1309 11/19/23 0214 11/20/23 0315  WBC 11.4*  --  9.8 8.7  CREATININE 0.47*  --  0.49* 0.62  LATICACIDVEN 0.8 0.8  --   --     Estimated Creatinine Clearance: 80.6 mL/min (by C-G formula based on SCr of 0.62 mg/dL).    Allergies  Allergen Reactions   Bee Venom Shortness Of Breath and Swelling   Morphine  And Codeine  Nausea And Vomiting   Penicillins      Jerome Washington 11/21/2023 12:49 AM

## 2023-11-21 NOTE — Plan of Care (Signed)
 Jerome Washington

## 2023-11-21 NOTE — Progress Notes (Signed)
 Overnight cross coverage, TRH, hospitalist service.  Was notified by rapid response team about the patient having acute respiratory decompensation with O2 saturation dropping in the 60s on 40 L with FiO2 of 40% on heated high flow nasal cannula through trach.  He required bagging via Ambu bag and lavaging with improvement of his O2 saturation in the 80s.  Portable chest x-ray done at bedside revealed left lower lobe infiltrates suggestive of pneumonia.  After changing position in the bed, turning to his right side, his O2 saturation improved to 96%.  Started on IV antibiotics, IV vancomycin and cefepime until pneumonia can be ruled out.  On physical exam, lungs are with diffuse rales bilaterally.  Mild expiratory wheezing noted upon auscultation of left lung anteriorly.  Uncuffed trach is in place.  Abdomen is soft, bowel sounds are present.  Has a G-tube for tube feedings.  No lower extremity edema.  No tenderness with palpation of calves bilaterally.  The patient is alert and oriented x 3.    Will continue to closely monitor and treat as indicated.  Appreciate rapid response team assistance.  Critical care time: 30 minutes

## 2023-11-21 NOTE — Progress Notes (Signed)
 RT called to room for low sat. Upon entering sat was on the low 70's. RT increased Oxygen to 100% and flow to 60L On HHF trach. Sat did not improve. RT then bag lavaged him several times and suctioned out thick white/clear secretions. After some time, his sat came up and was maintaining 90% on the HHF.

## 2023-11-21 NOTE — Progress Notes (Signed)
 PROGRESS NOTE        PATIENT DETAILS Name: Jerome Washington Age: 53 y.o. Sex: male Date of Birth: 1970/02/24 Admit Date: 11/17/2023 Admitting Physician Lonna Coder, MD ERE:[id number], [name], NP  Brief Summary: Patient is a 53 y.o.  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  Subsequently admitted to the ICU postoperatively-stabilized and then transferred to [facility].   Significant events: 10/28>> admit by ENT-tracheostomy/biopsy-transferred to ICU 10/29>> transferred to [facility] 10/30>> G-tube by IR  Significant studies: 10/26>> CT soft tissue neck: 6.3 x 3.2 x 4.3 mass in the posterior oropharynx-enlarged right posterior lateral retropharyngeal lymph node.  Significant microbiology data: None  Procedures: 10/28>> tracheotomy-laryngoscopy with biopsy. 10/30>> G-tube by IR  Consults: PCCM ENT  Subjective: Patient in bed, appears comfortable, denies any headache, no fever, no chest pain or pressure, no shortness of breath , no abdominal pain. No focal weakness.  Had respiratory distress late last night.  Objective: Vitals: Blood pressure (!) 100/57, pulse 77, temperature 98.3 F (36.8 C), temperature source Oral, resp. rate 20, height 5' 11 (1.803 m), weight 45.8 kg, SpO2 98%.   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  Stable this morning-lungs clear-CXR without pneumonia.  BNP not elevated. G-tube placed by IR on 10/30 G-tube feeds still not at [facility] he is at goal-he will need to be switched to bolus feeds in anticipation of discharge, electrolytes stable on tube feed  Episode of aspiration pneumonia late at night 11/20/2023.  On antibiotics.  Chest PT, elevate head of the bed, dropped tube feeding to 35 cc an hour, and monitor cultures and clinically, much better morning of 11/21/2023.  Hypophosphatemia/hypomagnesemia Stable today At risk for refeeding syndrome-follow phosphorus levels for a few more days.  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 (Active)    Underweight: Estimated body mass index is 14.08 kg/m as calculated from the following:   Height as of this encounter: 5' 11 (1.803 m).   Weight as of this encounter: 45.8 kg.   Code status:   Code Status: Full Code   DVT Prophylaxis: heparin injection 5,000 Units Start: 11/18/23 1330 SCDs Start: 11/17/23 1828   Family Communication: None at bedside   Disposition Plan: Status is: Inpatient Remains  inpatient appropriate because: Severity of illness   Planned Discharge Destination:Home   Diet: Diet Order             Diet  NPO time specified  Diet effective midnight                     MEDICATIONS: Scheduled Meds:  chlorhexidine  15 mL Mouth/Throat Once   Chlorhexidine Gluconate Cloth  6 each Topical Daily   heparin injection (subcutaneous)  5,000 Units Subcutaneous Q12H   ipratropium-albuterol  3 mL Nebulization TID   mouth rinse  15 mL Mouth Rinse 4 times per day   thiamine  100 mg Per Tube Daily   Continuous Infusions:  ceFEPime (MAXIPIME) IV 2 g (11/21/23 0836)   feeding supplement (OSMOLITE 1.5 CAL) 50 mL/hr at 11/20/23 2200   lactated ringers 75 mL/hr at 11/21/23 0305   vancomycin 750 mg (11/21/23 0831)   PRN Meds:.albuterol, docusate sodium, fentaNYL  (SUBLIMAZE ) injection, mouth rinse, polyethylene glycol   I have personally reviewed following labs and imaging studies  LABORATORY DATA: CBC: Recent Labs  Lab 11/15/23 1111 11/19/23 0214 11/20/23 0315 11/21/23 0318  WBC 11.4* 9.8 8.7 11.9*  NEUTROABS 9.1*  --   --  9.7*  HGB 14.6 11.4* 12.8* 12.3*  HCT 44.3 35.1* 38.7* 36.7*  MCV 103.3* 102.0* 100.5* 99.2  PLT 379 321 346 344    Basic Metabolic Panel: Recent Labs  Lab 11/15/23 1111 11/19/23 0214 11/20/23 0315 11/21/23 0319  NA 135 132* 136 135  K 4.3 3.8 3.5 3.6  CL 92* 93* 93* 94*  CO2 34* 30 32 29  GLUCOSE 85 110* 106* 143*  BUN 8 6 7 13   CREATININE 0.47* 0.49* 0.62 0.64  CALCIUM 10.3 9.3 9.7 10.5*  MG  --  1.6* 1.9 2.0  PHOS  --  2.2* 4.0 4.3    GFR: Estimated Creatinine Clearance: 71.6 mL/min (by C-G formula based on SCr of 0.64 mg/dL).  Liver Function Tests: Recent Labs  Lab 11/15/23 1111 11/19/23 0214  AST 22 17  ALT 6 8  ALKPHOS 121 57  BILITOT 0.5 0.9  PROT 7.4 5.8*  ALBUMIN 4.0 2.6*   No results for input(s): LIPASE, AMYLASE in the last 168 hours. No results for input(s): AMMONIA in the last 168 hours.  Coagulation Profile: Recent Labs  Lab 11/19/23 0214  INR 0.9    Cardiac Enzymes: No results for input(s): CKTOTAL, CKMB,  CKMBINDEX, TROPONINI in the last 168 hours.  BNP (last 3 results) No results for input(s): PROBNP in the last 8760 hours.  Lipid Profile: No results for input(s): CHOL, HDL, LDLCALC, TRIG, CHOLHDL, LDLDIRECT in the last 72 hours.  Thyroid Function Tests: No results for input(s): TSH, T4TOTAL, FREET4, T3FREE, THYROIDAB in the last 72 hours.  Anemia Panel: No results for input(s): VITAMINB12, FOLATE, FERRITIN, TIBC, IRON, RETICCTPCT in the last 72 hours.  Urine analysis:    Component Value Date/Time   COLORURINE YELLOW 08/17/2022 1230   APPEARANCEUR CLEAR 08/17/2022 1230   LABSPEC 1.008 08/17/2022 1230   PHURINE 5.0 08/17/2022 1230   GLUCOSEU NEGATIVE 08/17/2022 1230   HGBUR NEGATIVE 08/17/2022 1230   BILIRUBINUR NEGATIVE 08/17/2022 1230   KETONESUR NEGATIVE 08/17/2022 1230   PROTEINUR NEGATIVE 08/17/2022 1230   UROBILINOGEN 0.2 05/30/2011 1345   NITRITE NEGATIVE 08/17/2022 1230   LEUKOCYTESUR NEGATIVE 08/17/2022 1230    Sepsis Labs: Lactic Acid, Venous    Component Value Date/Time   LATICACIDVEN 0.8 11/15/2023 1309  MICROBIOLOGY: Recent Results (from the past 240 hours)  Resp panel by RT-PCR (RSV, Flu A&B, Covid) Anterior Nasal Swab     Status: None   Collection Time: 11/15/23 11:11 AM   Specimen: Anterior Nasal Swab  Result Value Ref Range Status   SARS Coronavirus 2 by RT PCR NEGATIVE NEGATIVE Final    Comment: (NOTE) SARS-CoV-2 target nucleic acids are NOT DETECTED.  The SARS-CoV-2 RNA is generally detectable in upper respiratory specimens during the acute phase of infection. The lowest concentration of SARS-CoV-2 viral copies this assay can detect is 138 copies/mL. A negative result does not preclude SARS-Cov-2 infection and should not be used as the sole basis for treatment or other patient management decisions. A negative result may occur with  improper specimen collection/handling, submission of specimen other than  nasopharyngeal swab, presence of viral mutation(s) within the areas targeted by this assay, and inadequate number of viral copies(<138 copies/mL). A negative result must be combined with clinical observations, patient history, and epidemiological information. The expected result is Negative.  Fact Sheet for Patients:  bloggercourse.com  Fact Sheet for Healthcare Providers:  seriousbroker.it  This test is no t yet approved or cleared by the [place]  FDA and  has been authorized for detection and/or diagnosis of SARS-CoV-2 by FDA under an Emergency Use Authorization (EUA). This EUA will remain  in effect (meaning this test can be used) for the duration of the COVID-19 declaration under Section 564(b)(1) of the Act, 21 U.S.C.section 360bbb-3(b)(1), unless the authorization is terminated  or revoked sooner.       Influenza A by PCR NEGATIVE NEGATIVE Final   Influenza B by PCR NEGATIVE NEGATIVE Final    Comment: (NOTE) The Xpert Xpress SARS-CoV-2/FLU/RSV plus assay is intended as an aid in the diagnosis of influenza from Nasopharyngeal swab specimens and should not be used as a sole basis for treatment. Nasal washings and aspirates are unacceptable for Xpert Xpress SARS-CoV-2/FLU/RSV testing.  Fact Sheet for Patients: bloggercourse.com  Fact Sheet for Healthcare Providers: seriousbroker.it  This test is not yet approved or cleared by the [place]  FDA and has been authorized for detection and/or diagnosis of SARS-CoV-2 by FDA under an Emergency Use Authorization (EUA). This EUA will remain in effect (meaning this test can be used) for the duration of the COVID-19 declaration under Section 564(b)(1) of the Act, 21 U.S.C. section 360bbb-3(b)(1), unless the authorization is terminated or revoked.     Resp Syncytial Virus by PCR NEGATIVE NEGATIVE Final    Comment:  (NOTE) Fact Sheet for Patients: bloggercourse.com  Fact Sheet for Healthcare Providers: seriousbroker.it  This test is not yet approved or cleared by the [place]  FDA and has been authorized for detection and/or diagnosis of SARS-CoV-2 by FDA under an Emergency Use Authorization (EUA). This EUA will remain in effect (meaning this test can be used) for the duration of the COVID-19 declaration under Section 564(b)(1) of the Act, 21 U.S.C. section 360bbb-3(b)(1), unless the authorization is terminated or revoked.  Performed at [facility], [address] [place], [place] [address]   Group A Strep by PCR     Status: None   Collection Time: 11/15/23 11:23 AM   Specimen: Anterior Nasal Swab; Sterile Swab  Result Value Ref Range Status   Group A Strep by PCR NOT DETECTED NOT DETECTED Final    Comment: Performed at [facility], [address], [place], [place] [address]  Blood Culture (routine x 2)     Status: None  Collection Time: 11/15/23 12:24 PM   Specimen: Right Antecubital; Blood  Result Value Ref Range Status   Specimen Description   Final    RIGHT ANTECUBITAL BOTTLES DRAWN AEROBIC AND ANAEROBIC   Special Requests   Final    Blood Culture results may not be optimal due to an inadequate volume of blood received in culture bottles   Culture   Final    NO GROWTH 5 DAYS Performed at [facility], [address], [place], [place] [address]    Report Status 11/20/2023 FINAL  Final  Blood Culture (routine x 2)     Status: None   Collection Time: 11/15/23 12:27 PM   Specimen: BLOOD RIGHT HAND  Result Value Ref Range Status   Specimen Description BLOOD RIGHT HAND AEROBIC BOTTLE ONLY  Final   Special Requests   Final    Blood Culture results may not be optimal due to an inadequate volume of blood received in culture bottles   Culture   Final    NO GROWTH 5 DAYS Performed at [facility], [address], [place], [place]  [address]    Report Status 11/20/2023 FINAL  Final  MRSA Next Gen by PCR, Nasal     Status: None   Collection Time: 11/17/23  6:28 PM   Specimen: Nasal Mucosa; Nasal Swab  Result Value Ref Range Status   MRSA by PCR Next Gen NOT DETECTED NOT DETECTED Final    Comment: (NOTE) The GeneXpert MRSA Assay (FDA approved for NASAL specimens only), is one component of a comprehensive MRSA colonization surveillance program. It is not intended to diagnose MRSA infection nor to guide or monitor treatment for MRSA infections. Test performance is not FDA approved in patients less than 79 years old. Performed at [facility], [address], [place], [place] [address]     RADIOLOGY STUDIES/RESULTS: DG Chest Port 1 View Result Date: 11/21/2023 EXAM: 1 VIEW(S) XRAY OF THE CHEST 11/21/2023 12:19:35 AM COMPARISON: 11/20/2023 CLINICAL HISTORY: 33497 Acute respiratory distress FINDINGS: LINES, TUBES AND DEVICES: Tracheostomy is unchanged. LUNGS AND PLEURA: Left lower lobe airspace opacity increased since prior study. No pulmonary edema. No pleural effusion. No pneumothorax. HEART AND MEDIASTINUM: No acute abnormality of the cardiac and mediastinal silhouettes. BONES AND SOFT TISSUES: Right supraclavicular subcutaneous emphysema has resolved. No acute osseous abnormality. IMPRESSION: 1. Increased left lower lobe airspace opacity since prior study. Appearance concerning for pneumonia. 2. Resolved right supraclavicular subcutaneous emphysema. Electronically signed by: Franky Crease MD 11/21/2023 12:28 AM EDT RP Workstation: [id number]   DG Chest Port 1 View Result Date: 11/20/2023 EXAM: 1 VIEW(S) XRAY OF THE CHEST 11/20/2023 07:41:00 AM COMPARISON: 11/17/2023 CLINICAL HISTORY: [id number] Hypoxia [id number] FINDINGS: LINES, TUBES AND DEVICES: Tracheostomy tube in place. LUNGS AND PLEURA: Elevated left hemidiaphragm. Mild patchy left lung base opacity, slightly increased. No pulmonary edema. No pleural effusion. No  pneumothorax. HEART AND MEDIASTINUM: No acute abnormality of the cardiac and mediastinal silhouettes. BONES AND SOFT TISSUES: Similar mild subcutaneous emphysema in right supraclavicular region. No acute osseous abnormality. IMPRESSION: 1. Mildly increased left basilar opacity, which may reflect atelectasis or aspiration/pneumonia. 2. Elevated left hemidiaphragm. 3. Tracheostomy tube in place. 4. Similar mild subcutaneous emphysema in the right supraclavicular region. Electronically signed by: Selinda Blue MD 11/20/2023 08:36 AM EDT RP Workstation: [id number]     LOS: 4 days   Signature  -    Lavada Stank M.D on 11/21/2023 at 10:19 AM   -  To page go to [email or web address]

## 2023-11-22 ENCOUNTER — Inpatient Hospital Stay (HOSPITAL_COMMUNITY)

## 2023-11-22 DIAGNOSIS — Z930 Tracheostomy status: Secondary | ICD-10-CM | POA: Diagnosis not present

## 2023-11-22 LAB — CBC WITH DIFFERENTIAL/PLATELET
Abs Immature Granulocytes: 0.06 K/uL (ref 0.00–0.07)
Basophils Absolute: 0 K/uL (ref 0.0–0.1)
Basophils Relative: 0 %
Eosinophils Absolute: 0 K/uL (ref 0.0–0.5)
Eosinophils Relative: 0 %
HCT: 35.2 % — ABNORMAL LOW (ref 39.0–52.0)
Hemoglobin: 11.7 g/dL — ABNORMAL LOW (ref 13.0–17.0)
Immature Granulocytes: 0 %
Lymphocytes Relative: 6 %
Lymphs Abs: 0.8 K/uL (ref 0.7–4.0)
MCH: 33.1 pg (ref 26.0–34.0)
MCHC: 33.2 g/dL (ref 30.0–36.0)
MCV: 99.7 fL (ref 80.0–100.0)
Monocytes Absolute: 1.4 K/uL — ABNORMAL HIGH (ref 0.1–1.0)
Monocytes Relative: 10 %
Neutro Abs: 11.9 K/uL — ABNORMAL HIGH (ref 1.7–7.7)
Neutrophils Relative %: 84 %
Platelets: 322 K/uL (ref 150–400)
RBC: 3.53 MIL/uL — ABNORMAL LOW (ref 4.22–5.81)
RDW: 12 % (ref 11.5–15.5)
WBC: 14.1 K/uL — ABNORMAL HIGH (ref 4.0–10.5)
nRBC: 0 % (ref 0.0–0.2)

## 2023-11-22 LAB — C-REACTIVE PROTEIN: CRP: 7.2 mg/dL — ABNORMAL HIGH (ref <1.0)

## 2023-11-22 LAB — MAGNESIUM: Magnesium: 1.7 mg/dL (ref 1.7–2.4)

## 2023-11-22 LAB — BASIC METABOLIC PANEL WITH GFR
Anion gap: 10 (ref 5–15)
BUN: 11 mg/dL (ref 6–20)
CO2: 31 mmol/L (ref 22–32)
Calcium: 9.9 mg/dL (ref 8.9–10.3)
Chloride: 95 mmol/L — ABNORMAL LOW (ref 98–111)
Creatinine, Ser: 0.68 mg/dL (ref 0.61–1.24)
GFR, Estimated: >60 mL/min (ref >60)
Glucose, Bld: 122 mg/dL — ABNORMAL HIGH (ref 70–99)
Potassium: 3.8 mmol/L (ref 3.5–5.1)
Sodium: 136 mmol/L (ref 135–145)

## 2023-11-22 LAB — PROCALCITONIN: Procalcitonin: <0.1 ng/mL

## 2023-11-22 LAB — GLUCOSE, CAPILLARY
Glucose-Capillary: 121 mg/dL — ABNORMAL HIGH (ref 70–99)
Glucose-Capillary: 123 mg/dL — ABNORMAL HIGH (ref 70–99)
Glucose-Capillary: 126 mg/dL — ABNORMAL HIGH (ref 70–99)
Glucose-Capillary: 127 mg/dL — ABNORMAL HIGH (ref 70–99)
Glucose-Capillary: 131 mg/dL — ABNORMAL HIGH (ref 70–99)
Glucose-Capillary: 146 mg/dL — ABNORMAL HIGH (ref 70–99)

## 2023-11-22 LAB — PHOSPHORUS: Phosphorus: 3.8 mg/dL (ref 2.5–4.6)

## 2023-11-22 MED ORDER — FUROSEMIDE 10 MG/ML IJ SOLN: 20.0000 mg | Freq: Once | INTRAMUSCULAR | Status: DC

## 2023-11-22 MED ORDER — MAGNESIUM SULFATE 2 GM/50ML IV SOLN
2.0000 g | Freq: Once | INTRAVENOUS | Status: AC
  Administered 2023-11-22: 2 g via INTRAVENOUS
  Filled 2023-11-22: qty 50

## 2023-11-22 MED ORDER — FUROSEMIDE 10 MG/ML IJ SOLN
40.0000 mg | Freq: Once | INTRAMUSCULAR | Status: AC
  Administered 2023-11-22: 40 mg via INTRAVENOUS
  Filled 2023-11-22: qty 4

## 2023-11-22 MED ORDER — POTASSIUM CHLORIDE 20 MEQ PO PACK
40.0000 meq | PACK | Freq: Once | ORAL | Status: AC
  Administered 2023-11-22: 40 meq
  Filled 2023-11-22: qty 2

## 2023-11-22 NOTE — Plan of Care (Signed)
 SABRA

## 2023-11-22 NOTE — Progress Notes (Signed)
 PROGRESS NOTE        PATIENT DETAILS Name: Jerome Washington Age: 53 y.o. Sex: male Date of Birth: March 28, 1970 Admit Date: 11/17/2023 Admitting Physician Lonna Coder, MD ERE:[id number], [name], NP  Brief Summary: Patient is a 53 y.o.  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  Subsequently admitted to the ICU postoperatively-stabilized and then transferred to [facility].   Significant events: 10/28>> admit by ENT-tracheostomy/biopsy-transferred to ICU 10/29>> transferred to [facility] 10/30>> G-tube by IR  Significant studies: 10/26>> CT soft tissue neck: 6.3 x 3.2 x 4.3 mass in the posterior oropharynx-enlarged right posterior lateral retropharyngeal lymph node.  Significant microbiology data: None  Procedures: 10/28>> tracheotomy-laryngoscopy with biopsy. 10/30>> G-tube by IR  Consults: PCCM ENT  Subjective:  Patient in bed, appears comfortable, denies any headache, no fever, no chest pain or pressure, improved shortness of breath , no abdominal pain. No new focal weakness.   Objective: Vitals: Blood pressure 116/77, pulse 86, temperature 98.5 F (36.9 C), temperature source Oral, resp. rate (!) 23, height 5' 11 (1.803 m), weight 46.6 kg, SpO2 100%.   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  Stable this morning-lungs clear-CXR without pneumonia.  BNP not elevated. G-tube placed by IR on 10/30 G-tube feeds still not at [facility] he is at goal-he will need to be switched to bolus feeds in anticipation of discharge, electrolytes stable on tube feed  Acute hypoxic respiratory failure after tracheostomy due to an episode of aspiration pneumonia late at night 11/20/2023.  On antibiotics.  Chest PT, elevate head of the bed, dropped tube feeding to 35 cc an hour, and monitor cultures and clinically, much better morning of 11/21/2023.  Likely aspirated regurgitated tube feeds.  Continue chest PT, discussed with RT, currently on heated high flow.  Will try to titrate down oxygen as tolerated.  Trial of Lasix as well on 11/22/2023  SpO2: 100 % O2 Flow Rate (L/min): 50 L/min (weaned per MD) FiO2 (%): 80 % (weaned per MD)   Hypophosphatemia/hypomagnesemia Stable today At risk for refeeding syndrome-follow phosphorus levels for a few more days.  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 (Active)    Underweight: Estimated body mass index is 14.33 kg/m as  calculated from the following:   Height as of this encounter: 5' 11 (1.803 m).   Weight as of this encounter: 46.6 kg.   Code status:   Code  Status: Full Code   DVT Prophylaxis: heparin injection 5,000 Units Start: 11/18/23 1330 SCDs Start: 11/17/23 1828   Family Communication: None at bedside   Disposition Plan: Status is: Inpatient Remains inpatient appropriate because: Severity of illness   Planned Discharge Destination:Home   Diet: Diet Order             Diet NPO time specified  Diet effective midnight                     MEDICATIONS: Scheduled Meds:  chlorhexidine  15 mL Mouth/Throat Once   Chlorhexidine Gluconate Cloth  6 each Topical Daily   furosemide  20 mg Intravenous Once   heparin injection (subcutaneous)  5,000 Units Subcutaneous Q12H   ipratropium-albuterol  3 mL Nebulization TID   mouth rinse  15 mL Mouth Rinse 4 times per day   potassium chloride  40 mEq Per Tube Once   thiamine  100 mg Per Tube Daily   Continuous Infusions:  ampicillin-sulbactam (UNASYN) IV 3 g (11/21/23 2106)   feeding supplement (OSMOLITE 1.5 CAL) 1,000 mL (11/21/23 1147)   PRN Meds:.albuterol, docusate sodium, fentaNYL  (SUBLIMAZE ) injection, mouth rinse, polyethylene glycol   I have personally reviewed following labs and imaging studies  LABORATORY DATA: CBC: Recent Labs  Lab 11/15/23 1111 11/19/23 0214 11/20/23 0315 11/21/23 0318 11/22/23 0406  WBC 11.4* 9.8 8.7 11.9* 14.1*  NEUTROABS 9.1*  --   --  9.7* 11.9*  HGB 14.6 11.4* 12.8* 12.3* 11.7*  HCT 44.3 35.1* 38.7* 36.7* 35.2*  MCV 103.3* 102.0* 100.5* 99.2 99.7  PLT 379 321 346 344 322    Basic Metabolic Panel: Recent Labs  Lab 11/15/23 1111 11/19/23 0214 11/20/23 0315 11/21/23 0319 11/22/23 0406  NA 135 132* 136 135 136  K 4.3 3.8 3.5 3.6 3.8  CL 92* 93* 93* 94* 95*  CO2 34* 30 32 29 31  GLUCOSE 85 110* 106* 143* 122*  BUN 8 6 7 13 11   CREATININE 0.47* 0.49* 0.62 0.64 0.68  CALCIUM 10.3 9.3 9.7 10.5* 9.9  MG  --  1.6* 1.9 2.0 1.7  PHOS  --  2.2* 4.0 4.3 3.8    GFR: Estimated Creatinine Clearance: 72.8 mL/min (by C-G formula  based on SCr of 0.68 mg/dL).  Liver Function Tests: Recent Labs  Lab 11/15/23 1111 11/19/23 0214  AST 22 17  ALT 6 8  ALKPHOS 121 57  BILITOT 0.5 0.9  PROT 7.4 5.8*  ALBUMIN 4.0 2.6*   No results for input(s): LIPASE, AMYLASE in the last 168 hours. No results for input(s): AMMONIA in the last 168 hours.  Coagulation Profile: Recent Labs  Lab 11/19/23 0214  INR 0.9    Cardiac Enzymes: No results for input(s): CKTOTAL, CKMB, CKMBINDEX, TROPONINI in the last 168 hours.  BNP (last 3 results) No results for input(s): PROBNP in the last 8760 hours.  Lipid Profile: No results for input(s): CHOL, HDL, LDLCALC, TRIG, CHOLHDL, LDLDIRECT in the last 72 hours.  Thyroid Function Tests: No results for input(s): TSH, T4TOTAL, FREET4, T3FREE, THYROIDAB in the last 72 hours.  Anemia Panel: No results for input(s): VITAMINB12, FOLATE, FERRITIN, TIBC, IRON, RETICCTPCT in the last 72 hours.  Urine analysis:    Component Value Date/Time   COLORURINE YELLOW 08/17/2022 1230   APPEARANCEUR CLEAR  08/17/2022 1230   LABSPEC 1.008 08/17/2022 1230   PHURINE 5.0 08/17/2022 1230   GLUCOSEU NEGATIVE 08/17/2022 1230   HGBUR NEGATIVE 08/17/2022 1230   BILIRUBINUR NEGATIVE 08/17/2022 1230   KETONESUR NEGATIVE 08/17/2022 1230   PROTEINUR NEGATIVE 08/17/2022 1230   UROBILINOGEN 0.2 05/30/2011 1345   NITRITE NEGATIVE 08/17/2022 1230   LEUKOCYTESUR NEGATIVE 08/17/2022 1230    Sepsis Labs: Lactic Acid, Venous    Component Value Date/Time   LATICACIDVEN 0.8 11/15/2023 1309    MICROBIOLOGY: Recent Results (from the past 240 hours)  Resp panel by RT-PCR (RSV, Flu A&B, Covid) Anterior Nasal Swab     Status: None   Collection Time: 11/15/23 11:11 AM   Specimen: Anterior Nasal Swab  Result Value Ref Range Status   SARS Coronavirus 2 by RT PCR NEGATIVE NEGATIVE Final    Comment: (NOTE) SARS-CoV-2 target nucleic acids are NOT DETECTED.  The  SARS-CoV-2 RNA is generally detectable in upper respiratory specimens during the acute phase of infection. The lowest concentration of SARS-CoV-2 viral copies this assay can detect is 138 copies/mL. A negative result does not preclude SARS-Cov-2 infection and should not be used as the sole basis for treatment or other patient management decisions. A negative result may occur with  improper specimen collection/handling, submission of specimen other than nasopharyngeal swab, presence of viral mutation(s) within the areas targeted by this assay, and inadequate number of viral copies(<138 copies/mL). A negative result must be combined with clinical observations, patient history, and epidemiological information. The expected result is Negative.  Fact Sheet for Patients:  bloggercourse.com  Fact Sheet for Healthcare Providers:  seriousbroker.it  This test is no t yet approved or cleared by the [place]  FDA and  has been authorized for detection and/or diagnosis of SARS-CoV-2 by FDA under an Emergency Use Authorization (EUA). This EUA will remain  in effect (meaning this test can be used) for the duration of the COVID-19 declaration under Section 564(b)(1) of the Act, 21 U.S.C.section 360bbb-3(b)(1), unless the authorization is terminated  or revoked sooner.       Influenza A by PCR NEGATIVE NEGATIVE Final   Influenza B by PCR NEGATIVE NEGATIVE Final    Comment: (NOTE) The Xpert Xpress SARS-CoV-2/FLU/RSV plus assay is intended as an aid in the diagnosis of influenza from Nasopharyngeal swab specimens and should not be used as a sole basis for treatment. Nasal washings and aspirates are unacceptable for Xpert Xpress SARS-CoV-2/FLU/RSV testing.  Fact Sheet for Patients: bloggercourse.com  Fact Sheet for Healthcare Providers: seriousbroker.it  This test is not yet approved or  cleared by the [place]  FDA and has been authorized for detection and/or diagnosis of SARS-CoV-2 by FDA under an Emergency Use Authorization (EUA). This EUA will remain in effect (meaning this test can be used) for the duration of the COVID-19 declaration under Section 564(b)(1) of the Act, 21 U.S.C. section 360bbb-3(b)(1), unless the authorization is terminated or revoked.     Resp Syncytial Virus by PCR NEGATIVE NEGATIVE Final    Comment: (NOTE) Fact Sheet for Patients: bloggercourse.com  Fact Sheet for Healthcare Providers: seriousbroker.it  This test is not yet approved or cleared by the [place]  FDA and has been authorized for detection and/or diagnosis of SARS-CoV-2 by FDA under an Emergency Use Authorization (EUA). This EUA will remain in effect (meaning this test can be used) for the duration of the COVID-19 declaration under Section 564(b)(1) of the Act, 21 U.S.C. section 360bbb-3(b)(1), unless the authorization is terminated or revoked.  Performed  at [facility], [address], [place], [place] [address]   Group A Strep by PCR     Status: None   Collection Time: 11/15/23 11:23 AM   Specimen: Anterior Nasal Swab; Sterile Swab  Result Value Ref Range Status   Group A Strep by PCR NOT DETECTED NOT DETECTED Final    Comment: Performed at [facility], [address], [place], [place] [address]  Blood Culture (routine x 2)     Status: None   Collection Time: 11/15/23 12:24 PM   Specimen: Right Antecubital; Blood  Result Value Ref Range Status   Specimen Description   Final    RIGHT ANTECUBITAL BOTTLES DRAWN AEROBIC AND ANAEROBIC   Special Requests   Final    Blood Culture results may not be optimal due to an inadequate volume of blood received in culture bottles   Culture   Final    NO GROWTH 5 DAYS Performed at [facility], [address] [place], [place] [address]    Report Status 11/20/2023 FINAL  Final   Blood Culture (routine x 2)     Status: None   Collection Time: 11/15/23 12:27 PM   Specimen: BLOOD RIGHT HAND  Result Value Ref Range Status   Specimen Description BLOOD RIGHT HAND AEROBIC BOTTLE ONLY  Final   Special Requests   Final    Blood Culture results may not be optimal due to an inadequate volume of blood received in culture bottles   Culture   Final    NO GROWTH 5 DAYS Performed at [facility], [address] [place], [place] [address]    Report Status 11/20/2023 FINAL  Final  MRSA Next Gen by PCR, Nasal     Status: None   Collection Time: 11/17/23  6:28 PM   Specimen: Nasal Mucosa; Nasal Swab  Result Value Ref Range Status   MRSA by PCR Next Gen NOT DETECTED NOT DETECTED Final    Comment: (NOTE) The GeneXpert MRSA Assay (FDA approved for NASAL specimens only), is one component of a comprehensive MRSA colonization surveillance program. It is not intended to diagnose MRSA infection nor to guide or monitor treatment for MRSA infections. Test performance is not FDA approved in patients less than 26 years old. Performed at [facility], [address] [place], [place] [address]     RADIOLOGY STUDIES/RESULTS: DG Chest Port 1 View Result Date: 11/22/2023 EXAM: 1 AP VIEW XRAY OF THE CHEST 11/22/2023 05:07:00 AM COMPARISON: AP radiograph of the chest dated 11/21/2023. CLINICAL HISTORY: SOB (shortness of breath). FINDINGS: LINES, TUBES AND DEVICES: A tracheostomy tube remains in satisfactory position. The gastrostomy tube is present. There are also surgical clips in the left upper quadrant. LUNGS AND PLEURA: Persistent elevation of the left hemidiaphragm with mild hazy opacification of the left medial lung base. No pulmonary edema. No pleural effusion. No pneumothorax. HEART AND MEDIASTINUM: No acute abnormality of the cardiac and mediastinal silhouettes. BONES AND SOFT TISSUES: No acute osseous abnormality. IMPRESSION: 1. Persistent elevation of the left hemidiaphragm with  mild hazy opacification of the left medial lung base. 2. Tracheostomy tube in satisfactory position. 3. Gastrostomy tube in place. Electronically signed by: Evalene Coho MD 11/22/2023 05:12 AM EST RP Workstation: [id number]   DG Chest Port 1 View Result Date: 11/21/2023 CLINICAL DATA:  Shortness of breath EXAM: PORTABLE CHEST 1 VIEW COMPARISON:  11/21/2023 at 12:08 a.m. FINDINGS: Single frontal view of the chest demonstrates stable tracheostomy tube. Continued dense consolidation at the medial left  lung base without appreciable change since prior study. No new airspace disease, effusion, or pneumothorax. Cardiac silhouette is stable. No acute bony abnormalities. IMPRESSION: 1. Persistent retrocardiac left lower lobe consolidation consistent with pneumonia or atelectasis. Electronically Signed   By: Ozell Daring M.D.   On: 11/21/2023 12:46   DG Chest Port 1 View Result Date: 11/21/2023 EXAM: 1 VIEW(S) XRAY OF THE CHEST 11/21/2023 12:19:35 AM COMPARISON: 11/20/2023 CLINICAL HISTORY: 33497 Acute respiratory distress FINDINGS: LINES, TUBES AND DEVICES: Tracheostomy is unchanged. LUNGS AND PLEURA: Left lower lobe airspace opacity increased since prior study. No pulmonary edema. No pleural effusion. No pneumothorax. HEART AND MEDIASTINUM: No acute abnormality of the cardiac and mediastinal silhouettes. BONES AND SOFT TISSUES: Right supraclavicular subcutaneous emphysema has resolved. No acute osseous abnormality. IMPRESSION: 1. Increased left lower lobe airspace opacity since prior study. Appearance concerning for pneumonia. 2. Resolved right supraclavicular subcutaneous emphysema. Electronically signed by: Franky Crease MD 11/21/2023 12:28 AM EDT RP Workstation: [id number]     LOS: 5 days   Signature  -    Lavada Stank M.D on 11/22/2023 at 8:58 AM   -  To page go to [email or web address]

## 2023-11-22 NOTE — Plan of Care (Addendum)
 Pt. Ambulated approx. 75 feet this shift in room with periods of desaturation to mid 80's and HR to 140's but w/o dyspnea. Problem: Education: Goal: Knowledge of General Education information will improve Description: Including pain rating scale, medication(s)/side effects and non-pharmacologic comfort measures Outcome: Progressing   Problem: Health Behavior/Discharge Planning: Goal: Ability to manage health-related needs will improve Outcome: Progressing   Problem: Clinical Measurements: Goal: Ability to maintain clinical measurements within normal limits will improve Outcome: Progressing Goal: Will remain free from infection Outcome: Progressing Goal: Diagnostic test results will improve Outcome: Progressing Goal: Respiratory complications will improve Outcome: Progressing Goal: Cardiovascular complication will be avoided Outcome: Progressing   Problem: Activity: Goal: Risk for activity intolerance will decrease Outcome: Progressing   Problem: Nutrition: Goal: Adequate nutrition will be maintained Outcome: Progressing   Problem: Coping: Goal: Level of anxiety will decrease Outcome: Progressing   Problem: Elimination: Goal: Will not experience complications related to bowel motility Outcome: Progressing Goal: Will not experience complications related to urinary retention Outcome: Progressing   Problem: Pain Managment: Goal: General experience of comfort will improve and/or be controlled Outcome: Progressing   Problem: Safety: Goal: Ability to remain free from injury will improve Outcome: Progressing   Problem: Skin Integrity: Goal: Risk for impaired skin integrity will decrease Outcome: Progressing

## 2023-11-23 DIAGNOSIS — Z930 Tracheostomy status: Secondary | ICD-10-CM | POA: Diagnosis not present

## 2023-11-23 LAB — CBC WITH DIFFERENTIAL/PLATELET
Abs Immature Granulocytes: 0.02 K/uL (ref 0.00–0.07)
Basophils Absolute: 0 K/uL (ref 0.0–0.1)
Basophils Relative: 0 %
Eosinophils Absolute: 0.1 K/uL (ref 0.0–0.5)
Eosinophils Relative: 0 %
HCT: 34.3 % — ABNORMAL LOW (ref 39.0–52.0)
Hemoglobin: 11.4 g/dL — ABNORMAL LOW (ref 13.0–17.0)
Immature Granulocytes: 0 %
Lymphocytes Relative: 9 %
Lymphs Abs: 1 K/uL (ref 0.7–4.0)
MCH: 33.2 pg (ref 26.0–34.0)
MCHC: 33.2 g/dL (ref 30.0–36.0)
MCV: 100 fL (ref 80.0–100.0)
Monocytes Absolute: 1.1 K/uL — ABNORMAL HIGH (ref 0.1–1.0)
Monocytes Relative: 10 %
Neutro Abs: 9.1 K/uL — ABNORMAL HIGH (ref 1.7–7.7)
Neutrophils Relative %: 81 %
Platelets: 311 K/uL (ref 150–400)
RBC: 3.43 MIL/uL — ABNORMAL LOW (ref 4.22–5.81)
RDW: 11.9 % (ref 11.5–15.5)
WBC: 11.4 K/uL — ABNORMAL HIGH (ref 4.0–10.5)
nRBC: 0 % (ref 0.0–0.2)

## 2023-11-23 LAB — GLUCOSE, CAPILLARY
Glucose-Capillary: 113 mg/dL — ABNORMAL HIGH (ref 70–99)
Glucose-Capillary: 115 mg/dL — ABNORMAL HIGH (ref 70–99)
Glucose-Capillary: 116 mg/dL — ABNORMAL HIGH (ref 70–99)
Glucose-Capillary: 117 mg/dL — ABNORMAL HIGH (ref 70–99)
Glucose-Capillary: 125 mg/dL — ABNORMAL HIGH (ref 70–99)
Glucose-Capillary: 131 mg/dL — ABNORMAL HIGH (ref 70–99)

## 2023-11-23 LAB — MAGNESIUM: Magnesium: 1.7 mg/dL (ref 1.7–2.4)

## 2023-11-23 LAB — BASIC METABOLIC PANEL WITH GFR
Anion gap: 10 (ref 5–15)
BUN: 10 mg/dL (ref 6–20)
CO2: 32 mmol/L (ref 22–32)
Calcium: 9.6 mg/dL (ref 8.9–10.3)
Chloride: 89 mmol/L — ABNORMAL LOW (ref 98–111)
Creatinine, Ser: 0.63 mg/dL (ref 0.61–1.24)
GFR, Estimated: >60 mL/min (ref >60)
Glucose, Bld: 118 mg/dL — ABNORMAL HIGH (ref 70–99)
Potassium: 3.3 mmol/L — ABNORMAL LOW (ref 3.5–5.1)
Sodium: 131 mmol/L — ABNORMAL LOW (ref 135–145)

## 2023-11-23 LAB — PHOSPHORUS: Phosphorus: 3.3 mg/dL (ref 2.5–4.6)

## 2023-11-23 LAB — PROCALCITONIN: Procalcitonin: <0.1 ng/mL

## 2023-11-23 LAB — C-REACTIVE PROTEIN: CRP: 10.5 mg/dL — ABNORMAL HIGH (ref <1.0)

## 2023-11-23 MED ORDER — DOCUSATE SODIUM 50 MG/5ML PO LIQD
100.0000 mg | Freq: Two times a day (BID) | ORAL | Status: DC
  Administered 2023-11-23 – 2023-11-24 (×3): 100 mg
  Filled 2023-11-23 (×4): qty 10

## 2023-11-23 MED ORDER — POTASSIUM CHLORIDE 20 MEQ PO PACK
40.0000 meq | PACK | Freq: Two times a day (BID) | ORAL | Status: AC
  Administered 2023-11-23 (×2): 40 meq
  Filled 2023-11-23 (×2): qty 2

## 2023-11-23 MED ORDER — POLYETHYLENE GLYCOL 3350 17 G PO PACK
17.0000 g | PACK | Freq: Two times a day (BID) | ORAL | Status: DC
  Administered 2023-11-23 – 2023-11-24 (×3): 17 g via JEJUNOSTOMY
  Filled 2023-11-23 (×5): qty 1

## 2023-11-23 MED ORDER — FUROSEMIDE 10 MG/ML IJ SOLN
40.0000 mg | Freq: Once | INTRAMUSCULAR | Status: AC
  Administered 2023-11-23: 40 mg via INTRAVENOUS
  Filled 2023-11-23: qty 4

## 2023-11-23 MED ORDER — ADULT MULTIVITAMIN W/MINERALS CH
1.0000 | ORAL_TABLET | Freq: Every day | ORAL | Status: DC
  Administered 2023-11-23 – 2023-11-27 (×5): 1
  Filled 2023-11-23 (×4): qty 1

## 2023-11-23 MED ORDER — BISACODYL 10 MG RE SUPP
10.0000 mg | Freq: Once | RECTAL | Status: AC
  Administered 2023-11-23: 10 mg via RECTAL
  Filled 2023-11-23: qty 1

## 2023-11-23 MED ORDER — MAGNESIUM SULFATE 2 GM/50ML IV SOLN
2.0000 g | Freq: Once | INTRAVENOUS | Status: AC
  Administered 2023-11-23: 2 g via INTRAVENOUS
  Filled 2023-11-23: qty 50

## 2023-11-23 MED ORDER — ADULT MULTIVITAMIN W/MINERALS CH
1.0000 | ORAL_TABLET | Freq: Every day | ORAL | Status: DC
Start: 2023-11-23 — End: 2023-11-23

## 2023-11-23 MED ORDER — PROSOURCE TF20 ENFIT COMPATIBL EN LIQD
60.0000 mL | Freq: Every day | ENTERAL | Status: DC
  Administered 2023-11-23 – 2023-11-27 (×5): 60 mL
  Filled 2023-11-23 (×4): qty 60

## 2023-11-23 MED ORDER — DOCUSATE SODIUM 100 MG PO CAPS
200.0000 mg | ORAL_CAPSULE | Freq: Two times a day (BID) | ORAL | Status: DC
  Filled 2023-11-23: qty 2

## 2023-11-23 NOTE — Plan of Care (Signed)

## 2023-11-23 NOTE — Progress Notes (Addendum)
 Nutrition Follow-up  DOCUMENTATION CODES:   Severe malnutrition in context of chronic illness, Underweight  INTERVENTION:  Continue tube feeds via PEG: Osmolite 1.5 @ 59ml/hr ( per day) DO NOT ADVANCE Add Pro-Source TF20 60 mL daily , each packet provides 20 grams of protein and 80 kcals.    Modified regimen provides: 1340 kcals (79% estimated needs), 73g protein (97% estimated needs), 640 ml free water   Recommend transition to Nutren 2.0 on discharge to better meet needs   Recommended TF schedule on discharge: Nutren 2.0 @36  ml/hr (864 ml per day) Total 3.5 cartons daily When able to tolerate free water flushes 100ml q4h -  (600 ml per day) - will need to monitor lab trends to assess fluid status Provides  1728 kcals, 73g protein and 1198 ml free water per day (FWF + TF)   Continue Thiamine 100mg  daily x1 day Recommend swallow assessment as appropriate to assess ability to advance diet Consider addition of scheduled bowel regimen as no BM x6 days Add MVI w/ minerals   NUTRITION DIAGNOSIS:  Severe Malnutrition related to chronic illness as evidenced by severe muscle depletion, severe fat depletion, percent weight loss.  GOAL:  Patient will meet greater than or equal to 90% of their needs  MONITOR:  Diet advancement, Labs, Weight trends  REASON FOR ASSESSMENT:  Other (Comment), Malnutrition Screening Tool (Low BMI)    ASSESSMENT:  Pt admitted for urgent trach placement d/t oropharynx mass. PMH significant for tobacco use, h/o oral cavity SCCa s/p resection, GERD, CAD.  10/28: admitted, OR for tracheostomy, direct laryngoscopy  with biopsy 10/29: transfer out of ICU 10/30:G-tube placed; TF initiated 10/31: desatted overnight; TF up to 56ml/hr; could not tolerate MBS d/t desaturation; trach changed from cuffed to uncuffed 11/01: RR called d/t respiratory status; asp PNA? TF reduced to 74ml/hr 11/03: O2 titrated down to 20L from 60L; TF remain at 75ml/hr   Patient  could not achieve goal rate over the weekend. Desatted early Friday (10/31) morning. Also unable to have MBS completed that day d/t desatting. Trach changed to uncuffed later that evening around 5PM. Noted with rapid response called 2/2 respiratory distress two hours later.    MD suspects aspiration of regurgitated TF and rate decreased to 11ml/hr at that time with goal rate of 38ml/hr. Notably, some discrepancy in what suctioned secretions looked like with RT documenting white/clear and rapid RN documenting thick/tan. Also no documentation of emesis prior to aspiration event. He was also likely laid flat for exchange of his trach earlier in the evening.  Discussed long-term plan with MD during progression. He is not amicable to increasing above 9ml/hr and suggests more concentrated formula, like Nepro. Of note, Nepro is not indicated and will likely not be covered by insurance as it is a specialty formula for kidney disease, of which patient does not have a diagnosis. He has also required continued potassium, phosphorus and magnesium  supplementation likely due to lack of stores at baseline. Nepro is low in the content of all of these minerals and patient would need more than Nepro could supply on a long-term basis. While Prosource can be added to bridge the calorie/protein gap, it is historically not covered by insurance as well and would not sufficiently bridge the caloric gap.   Currently only meeting 79% of his estimated calorie needs with Prosource in place. Will likely need modification in regimen to provide more calories as weight trends increase in the future. Could consider J-tube extension, however would still be  at increased risk of aspiration of secretions.   Barrier to adequately receiving ordered regimen on discharge as, if unable to advance rate, will not be able to receive cyclic or bolus tube feeding regimen. He will, no doubt, be required to disconnect himself throughout the day, on  discharge, to complete ADLs and, therefore, receive even less provision than what is recommended.   Although not desirable, would recommend transitioning patient to Nutren2.0 on discharge, as he is using [employer]. Would provide more calories and grams of protein, however would still have to infuse across 24 our period to sufficiently meet his estimated needs. He will also require free water flushes as concentrated formula only provides free water daily, at baseline.   Admit Weight: 47.2 kg Current Weight: 47.5 kg   Weight stable. No edema noted. Received IV dose of Lasix (40mg ) this morning. No actual bowel movement noted since 10/28. Patient reports this as well. Alerted MD to this. Recommend scheduled bowel regimen as he has PRN Miralax ordered, which has not been administered on chart review.    Medications: 2g Mg sulfate x1, 40 mEq BID (d/c today), thiamine, IV ABX   Has required phosphorus, magnesium , and potassium supplementation. Electrolytes being monitored. Last dose of thiamine today.   Labs:  Na+ 131 (L) K+ 3.3 (wdl) PHOS 3.3 (wdl) Mg 1.7 (wdl) CRP 10.5 (H) WBC 11.9>14.1>11.4 (H) CBG's 118-122 x24 hours   Diet Order:   Diet Order             Diet NPO time specified  Diet effective midnight                   EDUCATION NEEDS:  Not appropriate for education at this time  Skin:  Skin Assessment: Skin Integrity Issues: Skin Integrity Issues:: Stage I Stage I: coccyx  Last BM:  10/28 - would recommend scheduled bowel regimen  Height:  Ht Readings from Last 1 Encounters:  11/17/23 5' 11 (1.803 m)   Weight:  Wt Readings from Last 1 Encounters:  11/23/23 47.5 kg    Ideal Body Weight:  78.2 kg  BMI:  Body mass index is 14.61 kg/m.  Estimated Nutritional Needs:   Kcal:  1700-1900  Protein:  75-90g  Fluid:  >/=1.7L  Blair Deaner MS, RD, LDN Registered Dietitian Clinical Nutrition RD Inpatient Contact Info in Amion

## 2023-11-23 NOTE — Progress Notes (Signed)
 PROGRESS NOTE        PATIENT DETAILS Name: Jerome Washington Age: 53 y.o. Sex: male Date of Birth: [birth year] Admit Date: 11/17/2023 Admitting Physician Lonna Coder, MD ERE:[id number], [name], NP  Brief Summary: Patient is a 53 y.o.  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  Subsequently admitted to the ICU postoperatively-stabilized and then transferred to [facility].   Significant events: 10/28>> admit by ENT-tracheostomy/biopsy-transferred to ICU 10/29>> transferred to [facility] 10/30>> G-tube by IR  Significant studies: 10/26>> CT soft tissue neck: 6.3 x 3.2 x 4.3 mass in the posterior oropharynx-enlarged right posterior lateral retropharyngeal lymph node.  Significant microbiology data: None  Procedures: 10/28>> tracheotomy-laryngoscopy with biopsy. 10/30>> G-tube by IR  Consults: PCCM ENT  Subjective:  Patient in bed, appears comfortable, denies any headache, no fever, no chest pain or pressure, improved shortness of breath , no abdominal pain. No new focal weakness.   Objective: Vitals: Blood pressure 115/72, pulse 78, temperature 98.3 F (36.8 C), temperature source Oral, resp. rate (!) 22, height 5' 11 (1.803 m), weight 47.5 kg, SpO2 95%.   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  Stable this morning-lungs clear-CXR without pneumonia.  BNP not elevated. G-tube placed by IR on 10/30 G-tube feeds still not at [facility] he is at goal-he will need to be switched to bolus feeds in anticipation of discharge, electrolytes stable on tube feed  Acute hypoxic respiratory failure after tracheostomy due to an episode of aspiration pneumonia late at night 11/20/2023.  On antibiotics.  Chest PT, elevate head of the bed, dropped tube feeding to 35 cc an hour, and monitor cultures and clinically, much better morning of 11/21/2023.  Likely aspirated regurgitated tube feeds.  Continue chest PT, discussed with RT, currently on heated high flow for 24 hours he was on 60 L, now being titrated down on 20 L on 11/23/2023.  Will try to titrate down oxygen as tolerated.  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 (Active)    Underweight: Estimated body mass index is 14.61 kg/m as calculated  from the following:   Height as of this encounter: 5' 11 (1.803 m).   Weight as of this encounter: 47.5 kg.   Code status:   Code Status: Full Code   DVT  Prophylaxis: heparin injection 5,000 Units Start: 11/18/23 1330 SCDs Start: 11/17/23 1828   Family Communication: None at bedside   Disposition Plan: Status is: Inpatient Remains inpatient appropriate because: Severity of illness   Planned Discharge Destination:Home   Diet: Diet Order             Diet NPO time specified  Diet effective midnight                     MEDICATIONS: Scheduled Meds:  chlorhexidine  15 mL Mouth/Throat Once   Chlorhexidine Gluconate Cloth  6 each Topical Daily   heparin injection (subcutaneous)  5,000 Units Subcutaneous Q12H   ipratropium-albuterol  3 mL Nebulization TID   mouth rinse  15 mL Mouth Rinse 4 times per day   potassium chloride  40 mEq Per Tube BID   thiamine  100 mg Per Tube Daily   Continuous Infusions:  ampicillin-sulbactam (UNASYN) IV 3 g (11/23/23 0246)   feeding supplement (OSMOLITE 1.5 CAL) 1,000 mL (11/21/23 1147)   PRN Meds:.albuterol, docusate sodium, fentaNYL  (SUBLIMAZE ) injection, mouth rinse, polyethylene glycol   I have personally reviewed following labs and imaging studies  LABORATORY DATA: CBC: Recent Labs  Lab 11/19/23 0214 11/20/23 0315 11/21/23 0318 11/22/23 0406 11/23/23 0409  WBC 9.8 8.7 11.9* 14.1* 11.4*  NEUTROABS  --   --  9.7* 11.9* 9.1*  HGB 11.4* 12.8* 12.3* 11.7* 11.4*  HCT 35.1* 38.7* 36.7* 35.2* 34.3*  MCV 102.0* 100.5* 99.2 99.7 100.0  PLT 321 346 344 322 311    Basic Metabolic Panel: Recent Labs  Lab 11/19/23 0214 11/20/23 0315 11/21/23 0319 11/22/23 0406 11/23/23 0409  NA 132* 136 135 136 131*  K 3.8 3.5 3.6 3.8 3.3*  CL 93* 93* 94* 95* 89*  CO2 30 32 29 31 32  GLUCOSE 110* 106* 143* 122* 118*  BUN 6 7 13 11 10   CREATININE 0.49* 0.62 0.64 0.68 0.63  CALCIUM 9.3 9.7 10.5* 9.9 9.6  MG 1.6* 1.9 2.0 1.7 1.7  PHOS 2.2* 4.0 4.3 3.8 3.3    GFR: Estimated Creatinine Clearance: 74.2 mL/min (by C-G formula based on SCr of 0.63 mg/dL).  Liver Function Tests: Recent Labs   Lab 11/19/23 0214  AST 17  ALT 8  ALKPHOS 57  BILITOT 0.9  PROT 5.8*  ALBUMIN 2.6*   No results for input(s): LIPASE, AMYLASE in the last 168 hours. No results for input(s): AMMONIA in the last 168 hours.  Coagulation Profile: Recent Labs  Lab 11/19/23 0214  INR 0.9    Urine analysis:    Component Value Date/Time   COLORURINE YELLOW 08/17/2022 1230   APPEARANCEUR CLEAR 08/17/2022 1230   LABSPEC 1.008 08/17/2022 1230   PHURINE 5.0 08/17/2022 1230   GLUCOSEU NEGATIVE 08/17/2022 1230   HGBUR NEGATIVE 08/17/2022 1230   BILIRUBINUR NEGATIVE 08/17/2022 1230   KETONESUR NEGATIVE 08/17/2022 1230   PROTEINUR NEGATIVE 08/17/2022 1230   UROBILINOGEN 0.2 05/30/2011 1345   NITRITE NEGATIVE 08/17/2022 1230   LEUKOCYTESUR NEGATIVE 08/17/2022 1230    Sepsis Labs: Lactic Acid, Venous    Component Value Date/Time   LATICACIDVEN 0.8 11/15/2023 1309    MICROBIOLOGY: Recent Results (from the past 240 hours)  Resp panel by RT-PCR (RSV, Flu A&B, Covid) Anterior Nasal Swab  Status: None   Collection Time: 11/15/23 11:11 AM   Specimen: Anterior Nasal Swab  Result Value Ref Range Status   SARS Coronavirus 2 by RT PCR NEGATIVE NEGATIVE Final    Comment: (NOTE) SARS-CoV-2 target nucleic acids are NOT DETECTED.  The SARS-CoV-2 RNA is generally detectable in upper respiratory specimens during the acute phase of infection. The lowest concentration of SARS-CoV-2 viral copies this assay can detect is 138 copies/mL. A negative result does not preclude SARS-Cov-2 infection and should not be used as the sole basis for treatment or other patient management decisions. A negative result may occur with  improper specimen collection/handling, submission of specimen other than nasopharyngeal swab, presence of viral mutation(s) within the areas targeted by this assay, and inadequate number of viral copies(<138 copies/mL). A negative result must be combined with clinical observations,  patient history, and epidemiological information. The expected result is Negative.  Fact Sheet for Patients:  bloggercourse.com  Fact Sheet for Healthcare Providers:  seriousbroker.it  This test is no t yet approved or cleared by the [place]  FDA and  has been authorized for detection and/or diagnosis of SARS-CoV-2 by FDA under an Emergency Use Authorization (EUA). This EUA will remain  in effect (meaning this test can be used) for the duration of the COVID-19 declaration under Section 564(b)(1) of the Act, 21 U.S.C.section 360bbb-3(b)(1), unless the authorization is terminated  or revoked sooner.       Influenza A by PCR NEGATIVE NEGATIVE Final   Influenza B by PCR NEGATIVE NEGATIVE Final    Comment: (NOTE) The Xpert Xpress SARS-CoV-2/FLU/RSV plus assay is intended as an aid in the diagnosis of influenza from Nasopharyngeal swab specimens and should not be used as a sole basis for treatment. Nasal washings and aspirates are unacceptable for Xpert Xpress SARS-CoV-2/FLU/RSV testing.  Fact Sheet for Patients: bloggercourse.com  Fact Sheet for Healthcare Providers: seriousbroker.it  This test is not yet approved or cleared by the [place]  FDA and has been authorized for detection and/or diagnosis of SARS-CoV-2 by FDA under an Emergency Use Authorization (EUA). This EUA will remain in effect (meaning this test can be used) for the duration of the COVID-19 declaration under Section 564(b)(1) of the Act, 21 U.S.C. section 360bbb-3(b)(1), unless the authorization is terminated or revoked.     Resp Syncytial Virus by PCR NEGATIVE NEGATIVE Final    Comment: (NOTE) Fact Sheet for Patients: bloggercourse.com  Fact Sheet for Healthcare Providers: seriousbroker.it  This test is not yet approved or cleared by the [place]  [place] FDA and has been authorized for detection and/or diagnosis of SARS-CoV-2 by FDA under an Emergency Use Authorization (EUA). This EUA will remain in effect (meaning this test can be used) for the duration of the COVID-19 declaration under Section 564(b)(1) of the Act, 21 U.S.C. section 360bbb-3(b)(1), unless the authorization is terminated or revoked.  Performed at [facility], [address] [place], [place] [address]   Group A Strep by PCR     Status: None   Collection Time: 11/15/23 11:23 AM   Specimen: Anterior Nasal Swab; Sterile Swab  Result Value Ref Range Status   Group A Strep by PCR NOT DETECTED NOT DETECTED Final    Comment: Performed at [facility], [address] [place], [place] [address]  Blood Culture (routine x 2)     Status: None   Collection Time: 11/15/23 12:24 PM   Specimen: Right Antecubital; Blood  Result Value Ref Range Status   Specimen Description   Final  RIGHT ANTECUBITAL BOTTLES DRAWN AEROBIC AND ANAEROBIC   Special Requests   Final    Blood Culture results may not be optimal due to an inadequate volume of blood received in culture bottles   Culture   Final    NO GROWTH 5 DAYS Performed at [facility], [address] [place], [place] [address]    Report Status 11/20/2023 FINAL  Final  Blood Culture (routine x 2)     Status: None   Collection Time: 11/15/23 12:27 PM   Specimen: BLOOD RIGHT HAND  Result Value Ref Range Status   Specimen Description BLOOD RIGHT HAND AEROBIC BOTTLE ONLY  Final   Special Requests   Final    Blood Culture results may not be optimal due to an inadequate volume of blood received in culture bottles   Culture   Final    NO GROWTH 5 DAYS Performed at [facility], [address], [place], [place] [address]    Report Status 11/20/2023 FINAL  Final  MRSA Next Gen by PCR, Nasal     Status: None   Collection Time: 11/17/23  6:28 PM   Specimen: Nasal Mucosa; Nasal Swab  Result Value Ref Range Status   MRSA by PCR  Next Gen NOT DETECTED NOT DETECTED Final    Comment: (NOTE) The GeneXpert MRSA Assay (FDA approved for NASAL specimens only), is one component of a comprehensive MRSA colonization surveillance program. It is not intended to diagnose MRSA infection nor to guide or monitor treatment for MRSA infections. Test performance is not FDA approved in patients less than 58 years old. Performed at [facility], [address] [place], [place] [address]     RADIOLOGY STUDIES/RESULTS: DG Chest Port 1 View Result Date: 11/22/2023 EXAM: 1 AP VIEW XRAY OF THE CHEST 11/22/2023 05:07:00 AM COMPARISON: AP radiograph of the chest dated 11/21/2023. CLINICAL HISTORY: SOB (shortness of breath). FINDINGS: LINES, TUBES AND DEVICES: A tracheostomy tube remains in satisfactory position. The gastrostomy tube is present. There are also surgical clips in the left upper quadrant. LUNGS AND PLEURA: Persistent elevation of the left hemidiaphragm with mild hazy opacification of the left medial lung base. No pulmonary edema. No pleural effusion. No pneumothorax. HEART AND MEDIASTINUM: No acute abnormality of the cardiac and mediastinal silhouettes. BONES AND SOFT TISSUES: No acute osseous abnormality. IMPRESSION: 1. Persistent elevation of the left hemidiaphragm with mild hazy opacification of the left medial lung base. 2. Tracheostomy tube in satisfactory position. 3. Gastrostomy tube in place. Electronically signed by: Evalene Coho MD 11/22/2023 05:12 AM EST RP Workstation: [id number]   DG Chest Port 1 View Result Date: 11/21/2023 CLINICAL DATA:  Shortness of breath EXAM: PORTABLE CHEST 1 VIEW COMPARISON:  11/21/2023 at 12:08 a.m. FINDINGS: Single frontal view of the chest demonstrates stable tracheostomy tube. Continued dense consolidation at the medial left lung base without appreciable change since prior study. No new airspace disease, effusion, or pneumothorax. Cardiac silhouette is stable. No acute bony  abnormalities. IMPRESSION: 1. Persistent retrocardiac left lower lobe consolidation consistent with pneumonia or atelectasis. Electronically Signed   By: Ozell Daring M.D.   On: 11/21/2023 12:46     LOS: 6 days   Signature  -    Lavada Stank M.D on 11/23/2023 at 8:31 AM   -  To page go to [email or web address]

## 2023-11-23 NOTE — Progress Notes (Signed)
 Speech Language Pathology Treatment: Dysphagia  Jerome Washington Details Name: Jerome Washington MRN: [id number] DOB: 08/26/1970 Today's Date: 11/23/2023 Time: 9045-8989 SLP Time Calculation (min) (ACUTE ONLY): 16 min  Assessment / Plan / Recommendation Clinical Impression  SLP following for readiness to repeat attempt to complete MBS. Jerome Washington trach was changed to cuffless 10/31, though he remains on 20 LPM of heated high flow O2, which prohibits him from traveling to radiology. Given the description of Jerome Washington oropharyngeal mass from ENT note, ability to pass a FEES scope and visualize swallowing structures is unlikely.  Jerome Washington completed oral care independently and fed himself ice chips. This was followed by increasing hydrophonia and multiple swallows. He continues to report that swallowing feels uncomfortable. Recommend he remain NPO except ice chips in moderation with frequent oral care.   He is eager to continue wearing his PMSV, though the HHF attachment does not allow for proper fit. Jerome Washington is phonating without PMSV donned with seemingly improved volume but breathiness and reduced lingual ROM affect intelligibility. Discussed with MD and will f/u to complete an instrumental swallow study as respiratory status allows.   HPI HPI: Jerome Washington is a 53 y.o. male who presented 10/28 for operative treatment of oropharynx mass. Jerome Washington underwent tracheostomy on 10/28 due to respiratory distress. 6 cuffed trach initially placed, plan for 4 cuffless eventually. ENT reports Large oropharynx mass almost 270 degrees; tongue base did not appear involved but difficult to see entirety given bulk of mass. Distal airway including epiglottis and larynx not involved. Jerome Washington has a  prior diagnosis SCC of the mouth s/p glossectomy and radical neck dissection on 10.20.20. Jerome Washington has reported recent difficulty swallowing and has been observed to be malnourished. Jerome Washington had PEG tube placed on 11/18/23 to supplement nutrition.      SLP Plan  MBS           Recommendations  Diet recommendations: NPO Medication Administration: Via alternative means                  Oral care BID;Oral care prior to ice chip/H20   PRN Dysphagia, unspecified (R13.10)     MBS     Damien Blumenthal, M.A., CCC-SLP Speech Language Pathology, Acute Rehabilitation Services  Secure Chat preferred [phone]   11/23/2023, 10:45 AM

## 2023-11-24 ENCOUNTER — Inpatient Hospital Stay (HOSPITAL_COMMUNITY)

## 2023-11-24 DIAGNOSIS — Z930 Tracheostomy status: Secondary | ICD-10-CM | POA: Diagnosis not present

## 2023-11-24 LAB — PHOSPHORUS: Phosphorus: 3.6 mg/dL (ref 2.5–4.6)

## 2023-11-24 LAB — BASIC METABOLIC PANEL WITH GFR
Anion gap: 9 (ref 5–15)
BUN: 12 mg/dL (ref 6–20)
CO2: 30 mmol/L (ref 22–32)
Calcium: 9.6 mg/dL (ref 8.9–10.3)
Chloride: 94 mmol/L — ABNORMAL LOW (ref 98–111)
Creatinine, Ser: 0.63 mg/dL (ref 0.61–1.24)
GFR, Estimated: >60 mL/min (ref >60)
Glucose, Bld: 114 mg/dL — ABNORMAL HIGH (ref 70–99)
Potassium: 3.8 mmol/L (ref 3.5–5.1)
Sodium: 133 mmol/L — ABNORMAL LOW (ref 135–145)

## 2023-11-24 LAB — GLUCOSE, CAPILLARY
Glucose-Capillary: 107 mg/dL — ABNORMAL HIGH (ref 70–99)
Glucose-Capillary: 109 mg/dL — ABNORMAL HIGH (ref 70–99)
Glucose-Capillary: 110 mg/dL — ABNORMAL HIGH (ref 70–99)
Glucose-Capillary: 115 mg/dL — ABNORMAL HIGH (ref 70–99)
Glucose-Capillary: 126 mg/dL — ABNORMAL HIGH (ref 70–99)
Glucose-Capillary: 89 mg/dL (ref 70–99)

## 2023-11-24 LAB — CBC WITH DIFFERENTIAL/PLATELET
Abs Immature Granulocytes: 0.04 K/uL (ref 0.00–0.07)
Basophils Absolute: 0 K/uL (ref 0.0–0.1)
Basophils Relative: 0 %
Eosinophils Absolute: 0.1 K/uL (ref 0.0–0.5)
Eosinophils Relative: 1 %
HCT: 34.8 % — ABNORMAL LOW (ref 39.0–52.0)
Hemoglobin: 11.6 g/dL — ABNORMAL LOW (ref 13.0–17.0)
Immature Granulocytes: 1 %
Lymphocytes Relative: 11 %
Lymphs Abs: 0.9 K/uL (ref 0.7–4.0)
MCH: 33.3 pg (ref 26.0–34.0)
MCHC: 33.3 g/dL (ref 30.0–36.0)
MCV: 100 fL (ref 80.0–100.0)
Monocytes Absolute: 1 K/uL (ref 0.1–1.0)
Monocytes Relative: 12 %
Neutro Abs: 6.3 K/uL (ref 1.7–7.7)
Neutrophils Relative %: 75 %
Platelets: 370 K/uL (ref 150–400)
RBC: 3.48 MIL/uL — ABNORMAL LOW (ref 4.22–5.81)
RDW: 11.8 % (ref 11.5–15.5)
WBC: 8.3 K/uL (ref 4.0–10.5)
nRBC: 0 % (ref 0.0–0.2)

## 2023-11-24 LAB — C-REACTIVE PROTEIN: CRP: 7.4 mg/dL — ABNORMAL HIGH (ref <1.0)

## 2023-11-24 LAB — PROCALCITONIN: Procalcitonin: <0.1 ng/mL

## 2023-11-24 LAB — MAGNESIUM: Magnesium: 1.8 mg/dL (ref 1.7–2.4)

## 2023-11-24 MED ORDER — ACETAMINOPHEN 325 MG PO TABS
650.0000 mg | ORAL_TABLET | Freq: Four times a day (QID) | ORAL | Status: DC | PRN
  Administered 2023-11-24 – 2023-11-26 (×4): 650 mg
  Filled 2023-11-24 (×4): qty 2

## 2023-11-24 MED ORDER — MIDODRINE HCL 5 MG PO TABS
5.0000 mg | ORAL_TABLET | Freq: Three times a day (TID) | ORAL | Status: DC
  Administered 2023-11-24 – 2023-11-25 (×6): 5 mg
  Filled 2023-11-24 (×7): qty 1

## 2023-11-24 NOTE — Progress Notes (Signed)
 SLP Cancellation Note  Patient Details Name: Jerome Washington MRN: [id number] DOB: Aug 24, 1970   Cancelled treatment:       Reason Eval/Treat Not Completed: Visited pt briefly to discuss plan now that he is on room air. He is wearing PMSV with vitals stable, achieving improved intelligibility and secretion management. Will plan to proceed with an MBS next date as long as respiratory status remains stable. Pt in agreement. No charge at this time.    Damien Blumenthal, M.A., CCC-SLP Speech Language Pathology, Acute Rehabilitation Services  Secure Chat preferred [phone]  11/24/2023, 5:11 PM

## 2023-11-24 NOTE — Progress Notes (Signed)
   11/24/23 1022  Mobility  Activity Ambulated independently  Level of Assistance Standby assist, set-up cues, supervision of patient - no hands on  Assistive Device None  Distance Ambulated (ft) 50 ft (limited d/t lines)  Activity Response Tolerated fair  Mobility Referral Yes  Mobility visit 1 Mobility  Mobility Specialist Start Time (ACUTE ONLY) 1022  Mobility Specialist Stop Time (ACUTE ONLY) 1035  Mobility Specialist Time Calculation (min) (ACUTE ONLY) 13 min   Mobility Specialist: Progress Note  During Mobility: HR 102, SpO2 94-95% 15L HFNC Post-Mobility:    HR 88, SpO2 97% 15L HFNC  Pt agreeable to mobility session - received standing in room with RN present. Pt was asymptomatic throughout session with no complaints. Returned to bed with all needs met - call bell within reach. Bed alarm on.   Additional comments: Pt was limited with ambulation from bed to sink d/t lines.   Virgle Boards, BS Mobility Specialist Please contact via SecureChat or  Rehab office at [phone].

## 2023-11-24 NOTE — Plan of Care (Signed)

## 2023-11-24 NOTE — Progress Notes (Signed)
 PROGRESS NOTE        PATIENT DETAILS Name: Jerome Washington Age: 53 y.o. Sex: male Date of Birth: 07-18-1970 Admit Date: 11/17/2023 Admitting Physician Lonna Coder, MD ERE:[id number], [name], NP  Brief Summary: Patient is a 53 y.o.  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  Subsequently admitted to the ICU postoperatively-stabilized and then transferred to [facility].   Significant events: 10/28>> admit by ENT-tracheostomy/biopsy-transferred to ICU 10/29>> transferred to [facility] 10/30>> G-tube by IR  Significant studies: 10/26>> CT soft tissue neck: 6.3 x 3.2 x 4.3 mass in the posterior oropharynx-enlarged right posterior lateral retropharyngeal lymph node.  Significant microbiology data: None  Procedures: 10/28>> tracheotomy-laryngoscopy with biopsy. 10/30>> G-tube by IR  Consults: PCCM ENT  Subjective: Patient in bed, appears comfortable, denies any headache, no fever, no chest pain or pressure, no shortness of breath , no abdominal pain. No new focal weakness.  Objective: Vitals: Blood pressure 116/75, pulse 73, temperature 98.3 F (36.8 C), temperature source Oral, resp. rate 14, height 5' 11 (1.803 m), weight 46.7 kg, SpO2 97%.   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  Stable this morning-lungs clear-CXR without pneumonia.  BNP not elevated. G-tube placed by IR on 10/30 G-tube feeds still not at [facility] he is at goal-he will need to be switched to bolus feeds in anticipation of discharge, electrolytes stable on tube feed  Acute hypoxic respiratory failure after tracheostomy due to an episode of aspiration pneumonia late at night 11/20/2023.  On antibiotics.  Chest PT, elevate head of the bed, dropped tube feeding to 35 cc an hour, and monitor cultures and clinically, much better morning of 11/21/2023.  Likely aspirated regurgitated tube feeds.  Continue chest PT, discussed with RT, currently on heated high flow for 24 hours he was on 60 L, now being titrated down on 15 L on 11/24/2023.  Will try to titrate down oxygen as tolerated.  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 (Active)    Underweight: Estimated body mass index is 14.36 kg/m as calculated from the following:  Height as of this encounter: 5' 11 (1.803 m).   Weight as of this encounter: 46.7 kg.   Code status:   Code Status: Full Code   DVT  Prophylaxis: heparin injection 5,000 Units Start: 11/18/23 1330 SCDs Start: 11/17/23 1828   Family Communication: None at bedside   Disposition Plan: Status is: Inpatient Remains inpatient appropriate because: Severity of illness   Planned Discharge Destination:Home   Diet: Diet Order             Diet NPO time specified  Diet effective midnight                     MEDICATIONS: Scheduled Meds:  Chlorhexidine Gluconate Cloth  6 each Topical Daily   docusate  100 mg Per Tube BID   feeding supplement (PROSource TF20)  60 mL Per Tube Daily   heparin injection (subcutaneous)  5,000 Units Subcutaneous Q12H   ipratropium-albuterol  3 mL Nebulization TID   midodrine  5 mg Per Tube TID WC   multivitamin with minerals  1 tablet Per Tube Daily   mouth rinse  15 mL Mouth Rinse 4 times per day   polyethylene glycol  17 g Per J Tube BID   Continuous Infusions:  ampicillin-sulbactam (UNASYN) IV 3 g (11/24/23 0240)   feeding supplement (OSMOLITE 1.5 CAL) 1,000 mL (11/21/23 1147)   PRN Meds:.albuterol, docusate sodium, fentaNYL  (SUBLIMAZE ) injection, mouth rinse, polyethylene glycol   I have personally reviewed following labs and imaging studies  LABORATORY DATA: CBC: Recent Labs  Lab 11/20/23 0315 11/21/23 0318 11/22/23 0406 11/23/23 0409 11/24/23 0413  WBC 8.7 11.9* 14.1* 11.4* 8.3  NEUTROABS  --  9.7* 11.9* 9.1* 6.3  HGB 12.8* 12.3* 11.7* 11.4* 11.6*  HCT 38.7* 36.7* 35.2* 34.3* 34.8*  MCV 100.5* 99.2 99.7 100.0 100.0  PLT 346 344 322 311 370    Basic Metabolic Panel: Recent Labs  Lab 11/20/23 0315 11/21/23 0319 11/22/23 0406 11/23/23 0409 11/24/23 0413  NA 136 135 136 131* 133*  K 3.5 3.6 3.8 3.3* 3.8  CL 93* 94* 95* 89* 94*  CO2 32 29 31 32 30  GLUCOSE 106* 143* 122* 118* 114*  BUN 7 13 11 10 12   CREATININE 0.62 0.64 0.68 0.63 0.63  CALCIUM 9.7 10.5* 9.9 9.6 9.6  MG 1.9 2.0 1.7 1.7 1.8  PHOS 4.0 4.3 3.8 3.3 3.6    GFR: Estimated Creatinine  Clearance: 73 mL/min (by C-G formula based on SCr of 0.63 mg/dL).  Liver Function Tests: Recent Labs  Lab 11/19/23 0214  AST 17  ALT 8  ALKPHOS 57  BILITOT 0.9  PROT 5.8*  ALBUMIN 2.6*   No results for input(s): LIPASE, AMYLASE in the last 168 hours. No results for input(s): AMMONIA in the last 168 hours.  Coagulation Profile: Recent Labs  Lab 11/19/23 0214  INR 0.9    Urine analysis:    Component Value Date/Time   COLORURINE YELLOW 08/17/2022 1230   APPEARANCEUR CLEAR 08/17/2022 1230   LABSPEC 1.008 08/17/2022 1230   PHURINE 5.0 08/17/2022 1230   GLUCOSEU NEGATIVE 08/17/2022 1230   HGBUR NEGATIVE 08/17/2022 1230   BILIRUBINUR NEGATIVE 08/17/2022 1230   KETONESUR NEGATIVE 08/17/2022 1230   PROTEINUR NEGATIVE 08/17/2022 1230   UROBILINOGEN 0.2 05/30/2011 1345   NITRITE NEGATIVE 08/17/2022 1230   LEUKOCYTESUR NEGATIVE 08/17/2022 1230    Sepsis Labs: Lactic Acid, Venous    Component Value Date/Time   LATICACIDVEN 0.8 11/15/2023 1309    MICROBIOLOGY: Recent  Results (from the past 240 hours)  Resp panel by RT-PCR (RSV, Flu A&B, Covid) Anterior Nasal Swab     Status: None   Collection Time: 11/15/23 11:11 AM   Specimen: Anterior Nasal Swab  Result Value Ref Range Status   SARS Coronavirus 2 by RT PCR NEGATIVE NEGATIVE Final    Comment: (NOTE) SARS-CoV-2 target nucleic acids are NOT DETECTED.  The SARS-CoV-2 RNA is generally detectable in upper respiratory specimens during the acute phase of infection. The lowest concentration of SARS-CoV-2 viral copies this assay can detect is 138 copies/mL. A negative result does not preclude SARS-Cov-2 infection and should not be used as the sole basis for treatment or other patient management decisions. A negative result may occur with  improper specimen collection/handling, submission of specimen other than nasopharyngeal swab, presence of viral mutation(s) within the areas targeted by this assay, and inadequate  number of viral copies(<138 copies/mL). A negative result must be combined with clinical observations, patient history, and epidemiological information. The expected result is Negative.  Fact Sheet for Patients:  bloggercourse.com  Fact Sheet for Healthcare Providers:  seriousbroker.it  This test is no t yet approved or cleared by the [place]  FDA and  has been authorized for detection and/or diagnosis of SARS-CoV-2 by FDA under an Emergency Use Authorization (EUA). This EUA will remain  in effect (meaning this test can be used) for the duration of the COVID-19 declaration under Section 564(b)(1) of the Act, 21 U.S.C.section 360bbb-3(b)(1), unless the authorization is terminated  or revoked sooner.       Influenza A by PCR NEGATIVE NEGATIVE Final   Influenza B by PCR NEGATIVE NEGATIVE Final    Comment: (NOTE) The Xpert Xpress SARS-CoV-2/FLU/RSV plus assay is intended as an aid in the diagnosis of influenza from Nasopharyngeal swab specimens and should not be used as a sole basis for treatment. Nasal washings and aspirates are unacceptable for Xpert Xpress SARS-CoV-2/FLU/RSV testing.  Fact Sheet for Patients: bloggercourse.com  Fact Sheet for Healthcare Providers: seriousbroker.it  This test is not yet approved or cleared by the [place]  FDA and has been authorized for detection and/or diagnosis of SARS-CoV-2 by FDA under an Emergency Use Authorization (EUA). This EUA will remain in effect (meaning this test can be used) for the duration of the COVID-19 declaration under Section 564(b)(1) of the Act, 21 U.S.C. section 360bbb-3(b)(1), unless the authorization is terminated or revoked.     Resp Syncytial Virus by PCR NEGATIVE NEGATIVE Final    Comment: (NOTE) Fact Sheet for Patients: bloggercourse.com  Fact Sheet for Healthcare  Providers: seriousbroker.it  This test is not yet approved or cleared by the [place]  FDA and has been authorized for detection and/or diagnosis of SARS-CoV-2 by FDA under an Emergency Use Authorization (EUA). This EUA will remain in effect (meaning this test can be used) for the duration of the COVID-19 declaration under Section 564(b)(1) of the Act, 21 U.S.C. section 360bbb-3(b)(1), unless the authorization is terminated or revoked.  Performed at [facility], [address], [place], [place] [address]   Group A Strep by PCR     Status: None   Collection Time: 11/15/23 11:23 AM   Specimen: Anterior Nasal Swab; Sterile Swab  Result Value Ref Range Status   Group A Strep by PCR NOT DETECTED NOT DETECTED Final    Comment: Performed at [facility], [address] [place], [place] [address]  Blood Culture (routine x 2)     Status: None   Collection Time:  11/15/23 12:24 PM   Specimen: Right Antecubital; Blood  Result Value Ref Range Status   Specimen Description   Final    RIGHT ANTECUBITAL BOTTLES DRAWN AEROBIC AND ANAEROBIC   Special Requests   Final    Blood Culture results may not be optimal due to an inadequate volume of blood received in culture bottles   Culture   Final    NO GROWTH 5 DAYS Performed at [facility], [address] [place], [place] [address]    Report Status 11/20/2023 FINAL  Final  Blood Culture (routine x 2)     Status: None   Collection Time: 11/15/23 12:27 PM   Specimen: BLOOD RIGHT HAND  Result Value Ref Range Status   Specimen Description BLOOD RIGHT HAND AEROBIC BOTTLE ONLY  Final   Special Requests   Final    Blood Culture results may not be optimal due to an inadequate volume of blood received in culture bottles   Culture   Final    NO GROWTH 5 DAYS Performed at [facility], [address], [place], [place] [address]    Report Status 11/20/2023 FINAL  Final  MRSA Next Gen by PCR, Nasal     Status: None   Collection  Time: 11/17/23  6:28 PM   Specimen: Nasal Mucosa; Nasal Swab  Result Value Ref Range Status   MRSA by PCR Next Gen NOT DETECTED NOT DETECTED Final    Comment: (NOTE) The GeneXpert MRSA Assay (FDA approved for NASAL specimens only), is one component of a comprehensive MRSA colonization surveillance program. It is not intended to diagnose MRSA infection nor to guide or monitor treatment for MRSA infections. Test performance is not FDA approved in patients less than 18 years old. Performed at [facility], [address] [place], [place] [address]     RADIOLOGY STUDIES/RESULTS: DG Chest Port 1 View Result Date: 11/24/2023 EXAM: 1 VIEW(S) XRAY OF THE CHEST 11/24/2023 06:30:00 AM COMPARISON: 11/22/2023 CLINICAL HISTORY: SOB (shortness of breath) FINDINGS: LINES, TUBES AND DEVICES: Tracheostomy tube in place. Percutaneous gastrostomy tube noted. LUNGS AND PLEURA: Improved aeration of left lung base. Decreased left basilar opacity. No pulmonary edema. No pleural effusion. No pneumothorax. HEART AND MEDIASTINUM: No acute abnormality of the cardiac and mediastinal silhouettes. BONES AND SOFT TISSUES: No acute osseous abnormality. IMPRESSION: 1. Improved aeration of the left lung base with decreased left basilar opacity. Electronically signed by: Waddell Calk MD 11/24/2023 07:35 AM EST RP Workstation: [id number]     LOS: 7 days   Signature  -    Lavada Stank M.D on 11/24/2023 at 7:49 AM   -  To page go to [email or web address]

## 2023-11-24 NOTE — Plan of Care (Signed)
  Problem: Education: Goal: Knowledge of General Education information will improve Description: Including pain rating scale, medication(s)/side effects and non-pharmacologic comfort measures Outcome: Progressing   Problem: Health Behavior/Discharge Planning: Goal: Ability to manage health-related needs will improve Outcome: Progressing   Problem: Clinical Measurements: Goal: Respiratory complications will improve Outcome: Progressing   Problem: Activity: Goal: Risk for activity intolerance will decrease Outcome: Progressing   Problem: Nutrition: Goal: Adequate nutrition will be maintained Outcome: Progressing   Problem: Safety: Goal: Ability to remain free from injury will improve Outcome: Progressing   Problem: Skin Integrity: Goal: Risk for impaired skin integrity will decrease Outcome: Progressing

## 2023-11-24 NOTE — TOC Progression Note (Signed)
 Transition of Care [facility]) - Progression Note    Patient Details  Name: Jerome Washington MRN: [id number] Date of Birth: [birth year]  Transition [facility]) CM/SW Contact  [name], RN Phone Number: 11/24/2023, 8:08 AM  Clinical Narrative:     Received VM from Robert Medlin, [facility] private duty care, that patient was accepted for private duty home nursing.  Scanned signed order and note back to Rob at [email or web address]  Expected Discharge Plan: Home w Home Health Services Barriers to Discharge: Continued Medical Work up               Expected Discharge Plan and Services   Discharge Planning Services: CM Consult Post Acute Care Choice: Durable Medical Equipment, Home Health Living arrangements for the past 2 months: Single Family Home                 DME Arranged: Tube feeding   Date DME Agency Contacted: 11/20/23 Time DME Agency Contacted: 1214 Representative spoke with at DME Agency: [name]             Social Drivers of Health (SDOH) Interventions SDOH Screenings   Food Insecurity: No Food Insecurity (11/18/2023)  Housing: Low Risk  (11/18/2023)  Transportation Needs: No Transportation Needs (11/18/2023)  Utilities: Not At Risk (11/18/2023)  Tobacco Use: High Risk (11/17/2023)    Readmission Risk Interventions     No data to display

## 2023-11-25 ENCOUNTER — Inpatient Hospital Stay (HOSPITAL_COMMUNITY)

## 2023-11-25 DIAGNOSIS — Z930 Tracheostomy status: Secondary | ICD-10-CM | POA: Diagnosis not present

## 2023-11-25 LAB — CBC WITH DIFFERENTIAL/PLATELET
Abs Immature Granulocytes: 0.02 K/uL (ref 0.00–0.07)
Basophils Absolute: 0 K/uL (ref 0.0–0.1)
Basophils Relative: 1 %
Eosinophils Absolute: 0.1 K/uL (ref 0.0–0.5)
Eosinophils Relative: 1 %
HCT: 32.6 % — ABNORMAL LOW (ref 39.0–52.0)
Hemoglobin: 10.7 g/dL — ABNORMAL LOW (ref 13.0–17.0)
Immature Granulocytes: 0 %
Lymphocytes Relative: 13 %
Lymphs Abs: 1.1 K/uL (ref 0.7–4.0)
MCH: 33.2 pg (ref 26.0–34.0)
MCHC: 32.8 g/dL (ref 30.0–36.0)
MCV: 101.2 fL — ABNORMAL HIGH (ref 80.0–100.0)
Monocytes Absolute: 1 K/uL (ref 0.1–1.0)
Monocytes Relative: 12 %
Neutro Abs: 6.3 K/uL (ref 1.7–7.7)
Neutrophils Relative %: 73 %
Platelets: 377 K/uL (ref 150–400)
RBC: 3.22 MIL/uL — ABNORMAL LOW (ref 4.22–5.81)
RDW: 11.5 % (ref 11.5–15.5)
WBC: 8.6 K/uL (ref 4.0–10.5)
nRBC: 0 % (ref 0.0–0.2)

## 2023-11-25 LAB — GLUCOSE, CAPILLARY
Glucose-Capillary: 105 mg/dL — ABNORMAL HIGH (ref 70–99)
Glucose-Capillary: 110 mg/dL — ABNORMAL HIGH (ref 70–99)
Glucose-Capillary: 112 mg/dL — ABNORMAL HIGH (ref 70–99)
Glucose-Capillary: 128 mg/dL — ABNORMAL HIGH (ref 70–99)
Glucose-Capillary: 134 mg/dL — ABNORMAL HIGH (ref 70–99)
Glucose-Capillary: 83 mg/dL (ref 70–99)

## 2023-11-25 LAB — BASIC METABOLIC PANEL WITH GFR
Anion gap: 10 (ref 5–15)
BUN: 10 mg/dL (ref 6–20)
CO2: 30 mmol/L (ref 22–32)
Calcium: 9.2 mg/dL (ref 8.9–10.3)
Chloride: 88 mmol/L — ABNORMAL LOW (ref 98–111)
Creatinine, Ser: 0.51 mg/dL — ABNORMAL LOW (ref 0.61–1.24)
GFR, Estimated: >60 mL/min (ref >60)
Glucose, Bld: 89 mg/dL (ref 70–99)
Potassium: 3.4 mmol/L — ABNORMAL LOW (ref 3.5–5.1)
Sodium: 128 mmol/L — ABNORMAL LOW (ref 135–145)

## 2023-11-25 LAB — OSMOLALITY, URINE: Osmolality, Ur: 143 mosm/kg — ABNORMAL LOW (ref 300–900)

## 2023-11-25 LAB — OSMOLALITY: Osmolality: 282 mosm/kg (ref 275–295)

## 2023-11-25 LAB — PHOSPHORUS: Phosphorus: 3.5 mg/dL (ref 2.5–4.6)

## 2023-11-25 LAB — MAGNESIUM: Magnesium: 1.7 mg/dL (ref 1.7–2.4)

## 2023-11-25 LAB — URIC ACID: Uric Acid, Serum: 3.4 mg/dL — ABNORMAL LOW (ref 3.7–8.6)

## 2023-11-25 LAB — SODIUM, URINE, RANDOM: Sodium, Ur: <30 mmol/L

## 2023-11-25 MED ORDER — POTASSIUM CHLORIDE 20 MEQ PO PACK
40.0000 meq | PACK | Freq: Once | ORAL | Status: AC
  Administered 2023-11-25: 40 meq
  Filled 2023-11-25: qty 2

## 2023-11-25 MED ORDER — MELATONIN 5 MG PO TABS
5.0000 mg | ORAL_TABLET | Freq: Every evening | ORAL | Status: DC | PRN
Start: 2023-11-25 — End: 2023-11-27
  Administered 2023-11-25 – 2023-11-26 (×2): 5 mg
  Filled 2023-11-25 (×2): qty 1

## 2023-11-25 MED ORDER — LACTATED RINGERS IV SOLN: INTRAVENOUS | Status: AC

## 2023-11-25 NOTE — TOC Progression Note (Addendum)
 Transition of Care [facility]) - Progression Note    Patient Details  Name: Jerome Washington MRN: [id number] Date of Birth: [birth year]  Transition [facility] CM/SW Contact  [name], RN Phone Number: 11/25/2023, 12:26 PM  Clinical Narrative:     [name]- DME orders provided to [employer]. [facility] cannot do Trach set up over the weekend. Please update [employer] with estimated date of DC so they can ensure RT set up available.  Requested amb sat note from nursing staff for home O2 order as required by [employer] Awaiting RT education on trach care, consult has been placed.    Hospital bed- order closer to DC  Tube feeds- Spoke w RD, will place order with recs from 11/3 for con't feeds with pump and submit to [employer].   Home Health- [name] [name] start of care will be Nov 12/13th hopefully. He will get private duty nursing, medicaid will not cover SLP and I will make referral for outpatient SLP.   Expected Discharge Plan: Home w Home Health Services Barriers to Discharge: Continued Medical Work up               Expected Discharge Plan and Services   Discharge Planning Services: CM Consult Post Acute Care Choice: Durable Medical Equipment, Home Health Living arrangements for the past 2 months: Single Family Home                 DME Arranged: Tube feeding   Date DME Agency Contacted: 11/20/23 Time DME Agency Contacted: 1214 Representative spoke with at DME Agency: [name]             Social Drivers of Health (SDOH) Interventions SDOH Screenings   Food Insecurity: No Food Insecurity (11/18/2023)  Housing: Low Risk  (11/18/2023)  Transportation Needs: No Transportation Needs (11/18/2023)  Utilities: Not At Risk (11/18/2023)  Tobacco Use: High Risk (11/17/2023)    Readmission Risk Interventions     No data to display

## 2023-11-25 NOTE — Progress Notes (Signed)
 Speech Language Pathology Treatment: Dysphagia;Passy Muir Speaking valve  Patient Details Name: Jerome Washington MRN: [id number] DOB: [birth year] Today's Date: 11/25/2023 Time: [id number] SLP Time Calculation (min) (ACUTE ONLY): 20 min  Assessment / Plan / Recommendation Clinical Impression  Returned to pt's room to discuss MBS results and his goals. He was wearing the PMSV and is independent with donning/doffing it and all care. Pt describes extensive history of discomfort with swallowing. Discussed that although he may have been aspirating PTA, he is currently at increased risk for adverse reactions. He demonstrates understanding of recommendations and agrees to f/u with an SLP upon discharge to establish HEP with goal of resuming POs. Discussed with MD and TOC, will continue following.    HPI HPI: Patient is a 53 y.o. male who presented 10/28 for operative treatment of oropharynx mass. Pt underwent tracheostomy on 10/28 due to respiratory distress. 6 cuffed trach initially placed, plan for 4 cuffless eventually. ENT reports Large oropharynx mass almost 270 degrees; tongue base did not appear involved but difficult to see entirety given bulk of mass. Distal airway including epiglottis and larynx not involved. 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 Pt has reported recent difficulty swallowing and has been observed to be malnourished. Pt had PEG tube placed on 11/18/23 to supplement nutrition. Trach changed to #6 cuffless 10/31. CXR 11/1 shows LLL opacity, concerning for aspiration. MBS attempted 10/31 but unsuccessful due to period of desaturation. Required HHF 11/1-11/4. MBS 11/5 showed diffuse pharyngeal residue secondary to reduced BOT retraction, large oropharyngeal mass, and prominent CP bar resulting in silent aspiration after the swallow of liquids and frank penetration of purees.      SLP Plan  Continue with current plan of care           Recommendations  Diet recommendations: NPO Medication Administration: Via alternative means                  Oral care BID;Oral care prior to ice chip/H20   PRN Dysphagia, oropharyngeal phase (R13.12)     Continue with current plan of care     Damien Blumenthal, M.A., CCC-SLP Speech Language Pathology, Acute Rehabilitation Services  Secure Chat preferred [phone]   11/25/2023, 12:36 PM

## 2023-11-25 NOTE — Progress Notes (Addendum)
 Nutrition Follow-up  DOCUMENTATION CODES:   Severe malnutrition in context of chronic illness, Underweight  INTERVENTION:  Continue tube feeds via PEG: Osmolite 1.5 @ 40ml/hr ( per day) DO NOT ADVANCE Add Pro-Source TF20 60 mL daily , each packet provides 20 grams of protein and 80 kcals.   Add FWF 100ml qh4 (600 mL daily)  Regimen provides: 1340 kcals (79% estimated needs), 73g protein (97% estimated needs), 1240 ml free water (FWF + TF)  Recommend transition to Nutren 2.0 on discharge to better meet needs  Recommended TF schedule on discharge: Nutren 2.0 @36  ml/hr (864 ml per day) Total 3.5 cartons daily When able to tolerate free water flushes 100ml q4h -  (600 ml per day) - will need to monitor lab trends to assess fluid status Provides  1728 kcals, 73g protein and 1198 ml free water per day (FWF + TF)  Continue MVI w/ minerals   NUTRITION DIAGNOSIS:  Severe Malnutrition related to chronic illness as evidenced by severe muscle depletion, severe fat depletion, percent weight loss.  GOAL:  Patient will meet greater than or equal to 90% of their needs  MONITOR:  Diet advancement, Labs, Weight trends  REASON FOR ASSESSMENT:  Other (Comment), Malnutrition Screening Tool (Low BMI)    ASSESSMENT:   Pt admitted for urgent trach placement d/t oropharynx mass. PMH significant for tobacco use, h/o oral cavity SCCa s/p resection, GERD, CAD.  10/28: admitted, OR for tracheostomy, direct laryngoscopy  with biopsy 10/29: transfer out of ICU 10/30:G-tube placed; TF initiated 10/31: desatted overnight; TF up to 45ml/hr; could not tolerate MBS d/t desaturation; trach changed from cuffed to uncuffed 11/01: RR called d/t respiratory status; asp PNA? TF reduced to 33ml/hr 11/03: O2 titrated down to 20L from 60L; TF remain at 98ml/hr 11/05: MBS: NPO   Patient respiratory status continues to improve. Down to 6L O2. MBS completed today with recommendation for patient to remain  NPO.    Discharge planning ongoing. Discussed current recommended regimen with TOC. Discharge plan continues to be somewhat undesirable as he will require 24 hour feedings to meet his estimated needs at such a low administration rate. Could lower administration window to 22 hours if rate increased to 37ml/hr on discharge to allow for completion of ADLs.   Concern for ability to receive hydration via free water flushes due to increased infusion rate. Of note, he will need this as formula he is discharging on is highly concentrated. MD amicable to trial free water flushes starting tomorrow. Continue to recommend transitioning patient to Nutren2.0 on discharge, as he is using [employer]. Would provide more calories and grams of protein to sufficiently meet his needs. Will need follow up with outpatient RD. Would suggest cancer center RD if that route is indicated.   Admit Weight: 47.2 kg Current Weight: 48.4 kg   Weight stable. No edema noted. Bowels have moved. Continues on ordered bowel regimen, however noted some doses not given.   Drains/Lines: LUQ: PEG (20Fr) Tracheostomy: Shiley, 6mm uncuffed UOP: 450 ml x24 hours - appears inaccurate   Medications: docusate, MVI, Miralax   Has required potassium and magnesium  supplementation in last three days. Potassium received today. Sodium low as well as creatinine suggestive of dehydration. No furosemide since 11/03.    Labs:  Na+ 128 (L) K+ 3.4 (wdl) PHOS 3.5 (wdl) Mg 1.7 (wdl) CRP 10.5>7.4 (H) WBC 8.6 (wdl) CBG's 89-114 x24 hours   Diet Order:   Diet Order  Diet NPO time specified  Diet effective midnight                   EDUCATION NEEDS:  Not appropriate for education at this time  Skin:  Skin Assessment: Skin Integrity Issues: Skin Integrity Issues:: Stage I Stage I: coccyx  Last BM:  11/4 - type 1/5 x2  Height:  Ht Readings from Last 1 Encounters:  11/17/23 5' 11 (1.803 m)   Weight:  Wt Readings from  Last 1 Encounters:  11/25/23 48.4 kg   Ideal Body Weight:  78.2 kg  BMI:  Body mass index is 14.88 kg/m.  Estimated Nutritional Needs:   Kcal:  1700-1900  Protein:  75-90g  Fluid:  >/=1.7L  Blair Deaner MS, RD, LDN Registered Dietitian Clinical Nutrition RD Inpatient Contact Info in Amion

## 2023-11-25 NOTE — Progress Notes (Addendum)
 Modified Barium Swallow Study  Patient Details  Name: Jerome Washington MRN: [id number] Date of Birth: 03-27-1970  Today's Date: 11/25/2023  Modified Barium Swallow completed.  Full report located under Chart Review in the Imaging Section.  History of Present Illness Patient is a 53 y.o. male who presented 10/28 for operative treatment of oropharynx mass. Pt underwent tracheostomy on 10/28 due to respiratory distress. 6 cuffed trach initially placed, plan for 4 cuffless eventually. ENT reports Large oropharynx mass almost 270 degrees; tongue base did not appear involved but difficult to see entirety given bulk of mass. Distal airway including epiglottis and larynx not involved. 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 Pt has reported recent difficulty swallowing and has been observed to be malnourished. Pt had PEG tube placed on 11/18/23 to supplement nutrition. Trach changed to #6 cuffless 10/31. CXR 11/1 shows LLL opacity, concerning for aspiration. MBS attempted 10/31 but unsuccessful due to period of desaturation. Required HHF 11/1-11/4.   Clinical Impression Pt exhibits profound oropharyngeal dysphagia secondary to the chronicity of aspiration and the volume of residue regardless of consistency. The large oropharyngeal mass protrudes from the posterior pharyngeal wall at the level of the epiglottis, prohibiting inversion, though this seems to benefit laryngeal closure during the swallow as the mass approximates laryngeal structures. Airway invasion occurs most readily after the swallow due to the volume that accumulates within the pyriform sinuses. Aspiration is not consistently sensed and a cued cough, although forceful, does not clear aspirates (PAS 8). Residue increases with purees, resulting in frank penetration (PAS 5). Multiple effortful swallows clear some residuals but not all. This is significantly impacted by a prominent  cricopharyngeus, allowing only minimal amounts of bolus to clear the pharynx with each swallow. A chin tuck and bilateral head turns do not yield improved airway protection or decreased residue. The super supraglottic swallow maneuver appeared to allow improved PES distension, though pyriform sinus residue remains diffuse and risk of post-prandial aspiration exists.  Per chart review and pt report, he had been experiencing pain with swallowing since August 2025. Although there are no previous instrumental studies to compare, suspect both the oropharyngeal mass and prominent cricopharyngeus have been present PTA with pt remaining on a regular diet without adverse pulmonary consequences. He developed pneumonia while admitted and is currently at increased risk secondary to limited mobility, trach placement, and deconditioning. Recommend he remain NPO with ongoing f/u on an OP basis pending biopsy results and subsequent plan.  Factors that may increase risk of adverse event in presence of aspiration Noe & Lianne 2021): Poor general health and/or compromised immunity;Frail or deconditioned;Presence of tubes (ETT, trach, NG, etc.)  Swallow Evaluation Recommendations Recommendations: NPO Medication Administration: Via alternative means Oral care recommendations: Oral care QID (4x/day);Oral care before ice chips/water    Damien Blumenthal, M.A., CCC-SLP Speech Language Pathology, Acute Rehabilitation Services  Secure Chat preferred [phone]  11/25/2023,10:47 AM

## 2023-11-25 NOTE — Progress Notes (Signed)
 Spoke with mother on phone for an update of stable status. Mother verbalized understanding and denies any other questions at this time.

## 2023-11-25 NOTE — Progress Notes (Signed)
 PROGRESS NOTE        PATIENT DETAILS Name: Jerome Washington Age: 53 y.o. Sex: male Date of Birth: Sep 27, 1970 Admit Date: 11/17/2023 Admitting Physician Jerome Coder, MD ERE:[id number], Jerome Elizabeth, NP  Brief Summary: Patient is a 53 y.o.  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  Subsequently admitted to the ICU postoperatively-stabilized and then transferred to [facility].   Significant events: 10/28>> admit by ENT-tracheostomy/biopsy-transferred to ICU 10/29>> transferred to [facility] 10/30>> G-tube by IR  Significant studies: 10/26>> CT soft tissue neck: 6.3 x 3.2 x 4.3 mass in the posterior oropharynx-enlarged right posterior lateral retropharyngeal lymph node.  Significant microbiology data: None  Procedures: 10/28>> tracheotomy-laryngoscopy with biopsy. 10/30>> G-tube by IR  Consults: PCCM ENT  Subjective: Patient in bed, appears comfortable, denies any headache, no fever, no chest pain or pressure, no shortness of breath , no abdominal pain. No focal weakness.  Objective: Vitals: Blood pressure 104/69, pulse 76, temperature 98.4 F (36.9 C), temperature source Oral, resp. rate (!) 21, height 5' 11 (1.803 m), weight 48.4 kg, SpO2 94%.   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  Stable this morning-lungs clear-CXR without pneumonia.  BNP not elevated. G-tube placed by IR on 10/30 G-tube feeds still not at [facility] he is at goal-he will need to be switched to bolus feeds in anticipation of discharge, electrolytes stable on tube feed  Acute hypoxic respiratory failure after tracheostomy due to an episode of aspiration pneumonia late at night 11/20/2023.  On antibiotics.  Chest PT, elevate head of the bed, dropped tube feeding to 35 cc an hour, and monitor cultures and clinically, much better morning of 11/21/2023.  Likely aspirated regurgitated tube feeds.  Continue chest PT, discussed with RT, currently on heated high flow for 24 hours he was on 60 L, now being titrated down on 6 L on 11/25/2023.  Will try to titrate down oxygen as tolerated.  Overall much improved on 11/25/2023.  SpO2: 94 % O2 Flow Rate (L/min): 6 L/min FiO2 (%): 21 %   Hypophosphatemia/hypomagnesemia/Hypokalemia Replaced  Hyponatremia with hypotension.  Hydrate and monitor.  Urine sodium less than 30.    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 (Active)  Underweight: Estimated body mass index is 14.88 kg/m as calculated from the following:   Height as of this encounter: 5' 11 (1.803 m).   Weight as of this  encounter: 48.4 kg.   Code status:   Code Status: Full Code   DVT Prophylaxis: heparin injection 5,000 Units Start: 11/18/23 1330 SCDs Start: 11/17/23 1828   Family Communication: None at bedside   Disposition Plan: Status is: Inpatient Remains inpatient appropriate because: Severity of illness   Planned Discharge Destination:Home   Diet: Diet Order             Diet NPO time specified  Diet effective midnight                     MEDICATIONS: Scheduled Meds:  Chlorhexidine Gluconate Cloth  6 each Topical Daily   docusate  100 mg Per Tube BID   feeding supplement (PROSource TF20)  60 mL Per Tube Daily   heparin injection (subcutaneous)  5,000 Units Subcutaneous Q12H   ipratropium-albuterol  3 mL Nebulization TID   midodrine  5 mg Per Tube TID WC   multivitamin with minerals  1 tablet Per Tube Daily   mouth rinse  15 mL Mouth Rinse 4 times per day   polyethylene glycol  17 g Per J Tube BID   Continuous Infusions:  ampicillin-sulbactam (UNASYN) IV 3 g (11/25/23 0330)   feeding supplement (OSMOLITE 1.5 CAL) 1,000 mL (11/21/23 1147)   lactated ringers     PRN Meds:.acetaminophen , albuterol, docusate sodium, fentaNYL  (SUBLIMAZE ) injection, mouth rinse, polyethylene glycol   I have personally reviewed following labs and imaging studies  LABORATORY DATA: CBC: Recent Labs  Lab 11/21/23 0318 11/22/23 0406 11/23/23 0409 11/24/23 0413 11/25/23 0257  WBC 11.9* 14.1* 11.4* 8.3 8.6  NEUTROABS 9.7* 11.9* 9.1* 6.3 6.3  HGB 12.3* 11.7* 11.4* 11.6* 10.7*  HCT 36.7* 35.2* 34.3* 34.8* 32.6*  MCV 99.2 99.7 100.0 100.0 101.2*  PLT 344 322 311 370 377    Basic Metabolic Panel: Recent Labs  Lab 11/21/23 0319 11/22/23 0406 11/23/23 0409 11/24/23 0413 11/25/23 0257  NA 135 136 131* 133* 128*  K 3.6 3.8 3.3* 3.8 3.4*  CL 94* 95* 89* 94* 88*  CO2 29 31 32 30 30  GLUCOSE 143* 122* 118* 114* 89  BUN 13 11 10 12 10   CREATININE 0.64 0.68 0.63 0.63 0.51*  CALCIUM  10.5* 9.9 9.6 9.6 9.2  MG 2.0 1.7 1.7 1.8 1.7  PHOS 4.3 3.8 3.3 3.6 3.5    GFR: Estimated Creatinine Clearance: 75.6 mL/min (A) (by C-G formula based on SCr of 0.51 mg/dL (L)).  Liver Function Tests: Recent Labs  Lab 11/19/23 0214  AST 17  ALT 8  ALKPHOS 57  BILITOT 0.9  PROT 5.8*  ALBUMIN 2.6*   No results for input(s): LIPASE, AMYLASE in the last 168 hours. No results for input(s): AMMONIA in the last 168 hours.  Coagulation Profile: Recent Labs  Lab 11/19/23 0214  INR 0.9    Urine analysis:    Component Value Date/Time   COLORURINE YELLOW 08/17/2022 1230   APPEARANCEUR CLEAR 08/17/2022 1230   LABSPEC 1.008 08/17/2022 1230   PHURINE 5.0 08/17/2022 1230   GLUCOSEU NEGATIVE 08/17/2022 1230   HGBUR NEGATIVE 08/17/2022 1230   BILIRUBINUR NEGATIVE 08/17/2022 1230   KETONESUR NEGATIVE 08/17/2022 1230   PROTEINUR NEGATIVE 08/17/2022 1230   UROBILINOGEN 0.2 05/30/2011 1345   NITRITE NEGATIVE 08/17/2022 1230   LEUKOCYTESUR NEGATIVE 08/17/2022 1230  Sepsis Labs: Lactic Acid, Venous    Component Value Date/Time   LATICACIDVEN 0.8 11/15/2023 1309    MICROBIOLOGY: Recent Results (from the past 240 hours)  Resp panel by RT-PCR (RSV, Flu A&B, Covid) Anterior Nasal Swab     Status: None   Collection Time: 11/15/23 11:11 AM   Specimen: Anterior Nasal Swab  Result Value Ref Range Status   SARS Coronavirus 2 by RT PCR NEGATIVE NEGATIVE Final    Comment: (NOTE) SARS-CoV-2 target nucleic acids are NOT DETECTED.  The SARS-CoV-2 RNA is generally detectable in upper respiratory specimens during the acute phase of infection. The lowest concentration of SARS-CoV-2 viral copies this assay can detect is 138 copies/mL. A negative result does not preclude SARS-Cov-2 infection and should not be used as the sole basis for treatment or other patient management decisions. A negative result may occur with  improper specimen collection/handling, submission of specimen  other than nasopharyngeal swab, presence of viral mutation(s) within the areas targeted by this assay, and inadequate number of viral copies(<138 copies/mL). A negative result must be combined with clinical observations, patient history, and epidemiological information. The expected result is Negative.  Fact Sheet for Patients:  bloggercourse.com  Fact Sheet for Healthcare Providers:  seriousbroker.it  This test is no t yet approved or cleared by the [place]  FDA and  has been authorized for detection and/or diagnosis of SARS-CoV-2 by FDA under an Emergency Use Authorization (EUA). This EUA will remain  in effect (meaning this test can be used) for the duration of the COVID-19 declaration under Section 564(b)(1) of the Act, 21 U.S.C.section 360bbb-3(b)(1), unless the authorization is terminated  or revoked sooner.       Influenza A by PCR NEGATIVE NEGATIVE Final   Influenza B by PCR NEGATIVE NEGATIVE Final    Comment: (NOTE) The Xpert Xpress SARS-CoV-2/FLU/RSV plus assay is intended as an aid in the diagnosis of influenza from Nasopharyngeal swab specimens and should not be used as a sole basis for treatment. Nasal washings and aspirates are unacceptable for Xpert Xpress SARS-CoV-2/FLU/RSV testing.  Fact Sheet for Patients: bloggercourse.com  Fact Sheet for Healthcare Providers: seriousbroker.it  This test is not yet approved or cleared by the [place]  FDA and has been authorized for detection and/or diagnosis of SARS-CoV-2 by FDA under an Emergency Use Authorization (EUA). This EUA will remain in effect (meaning this test can be used) for the duration of the COVID-19 declaration under Section 564(b)(1) of the Act, 21 U.S.C. section 360bbb-3(b)(1), unless the authorization is terminated or revoked.     Resp Syncytial Virus by PCR NEGATIVE NEGATIVE Final     Comment: (NOTE) Fact Sheet for Patients: bloggercourse.com  Fact Sheet for Healthcare Providers: seriousbroker.it  This test is not yet approved or cleared by the [place]  FDA and has been authorized for detection and/or diagnosis of SARS-CoV-2 by FDA under an Emergency Use Authorization (EUA). This EUA will remain in effect (meaning this test can be used) for the duration of the COVID-19 declaration under Section 564(b)(1) of the Act, 21 U.S.C. section 360bbb-3(b)(1), unless the authorization is terminated or revoked.  Performed at [facility], [address] [place], [place] [address]   Group A Strep by PCR     Status: None   Collection Time: 11/15/23 11:23 AM   Specimen: Anterior Nasal Swab; Sterile Swab  Result Value Ref Range Status   Group A Strep by PCR NOT DETECTED NOT DETECTED Final    Comment: Performed at [facility],  [address] [place], [place] [address]  Blood Culture (routine x 2)     Status: None   Collection Time: 11/15/23 12:24 PM   Specimen: Right Antecubital; Blood  Result Value Ref Range Status   Specimen Description   Final    RIGHT ANTECUBITAL BOTTLES DRAWN AEROBIC AND ANAEROBIC   Special Requests   Final    Blood Culture results may not be optimal due to an inadequate volume of blood received in culture bottles   Culture   Final    NO GROWTH 5 DAYS Performed at [facility], [address] [place], [place] [address]    Report Status 11/20/2023 FINAL  Final  Blood Culture (routine x 2)     Status: None   Collection Time: 11/15/23 12:27 PM   Specimen: BLOOD RIGHT HAND  Result Value Ref Range Status   Specimen Description BLOOD RIGHT HAND AEROBIC BOTTLE ONLY  Final   Special Requests   Final    Blood Culture results may not be optimal due to an inadequate volume of blood received in culture bottles   Culture   Final    NO GROWTH 5 DAYS Performed at [facility], [address],  [place], [place] [address]    Report Status 11/20/2023 FINAL  Final  MRSA Next Gen by PCR, Nasal     Status: None   Collection Time: 11/17/23  6:28 PM   Specimen: Nasal Mucosa; Nasal Swab  Result Value Ref Range Status   MRSA by PCR Next Gen NOT DETECTED NOT DETECTED Final    Comment: (NOTE) The GeneXpert MRSA Assay (FDA approved for NASAL specimens only), is one component of a comprehensive MRSA colonization surveillance program. It is not intended to diagnose MRSA infection nor to guide or monitor treatment for MRSA infections. Test performance is not FDA approved in patients less than 5 years old. Performed at [facility], [address] [place], [place] [address]     RADIOLOGY STUDIES/RESULTS: DG Chest Port 1 View Result Date: 11/24/2023 EXAM: 1 VIEW(S) XRAY OF THE CHEST 11/24/2023 06:30:00 AM COMPARISON: 11/22/2023 CLINICAL HISTORY: SOB (shortness of breath) FINDINGS: LINES, TUBES AND DEVICES: Tracheostomy tube in place. Percutaneous gastrostomy tube noted. LUNGS AND PLEURA: Improved aeration of left lung base. Decreased left basilar opacity. No pulmonary edema. No pleural effusion. No pneumothorax. HEART AND MEDIASTINUM: No acute abnormality of the cardiac and mediastinal silhouettes. BONES AND SOFT TISSUES: No acute osseous abnormality. IMPRESSION: 1. Improved aeration of the left lung base with decreased left basilar opacity. Electronically signed by: Waddell Calk MD 11/24/2023 07:35 AM EST RP Workstation: [id number]     LOS: 8 days   Signature  -    Lavada Stank M.D on 11/25/2023 at 8:10 AM   -  To page go to [email or web address]

## 2023-11-25 NOTE — Plan of Care (Signed)
   Problem: Education: Goal: Knowledge of General Education information will improve Description: Including pain rating scale, medication(s)/side effects and non-pharmacologic comfort measures Outcome: Progressing   Problem: Clinical Measurements: Goal: Ability to maintain clinical measurements within normal limits will improve Outcome: Progressing Goal: Respiratory complications will improve Outcome: Progressing   Problem: Activity: Goal: Risk for activity intolerance will decrease Outcome: Progressing   Problem: Nutrition: Goal: Adequate nutrition will be maintained Outcome: Progressing

## 2023-11-25 NOTE — Progress Notes (Signed)
   11/25/23 1445  Mobility  Activity Ambulated independently  Level of Assistance Independent  Assistive Device None  Distance Ambulated (ft) 1000 ft  Activity Response Tolerated fair  Mobility Referral Yes  Mobility visit 1 Mobility  Mobility Specialist Start Time (ACUTE ONLY) 1445  Mobility Specialist Stop Time (ACUTE ONLY) 1459  Mobility Specialist Time Calculation (min) (ACUTE ONLY) 14 min   Mobility Specialist: Progress Note  Pt agreeable to mobility session - received in bed. Pt was asymptomatic throughout session with no complaints.Returned to bed with all needs met - call bell within reach.   Additional comments: Nurse requested Mobility Specialist to perform oxygen saturation test with pt which includes removing pt from oxygen both at rest and while ambulating.  Below are the results from that testing.     Patient Saturations on Room Air at Rest = spO2 98%  Patient Saturations on Room Air while Ambulating = sp02 96% .    At end of testing pt left in room on RA .  Reported results to nurse.    Virgle Boards, BS Mobility Specialist Please contact via SecureChat or  Rehab office at [phone].

## 2023-11-26 ENCOUNTER — Other Ambulatory Visit (HOSPITAL_COMMUNITY): Payer: Self-pay

## 2023-11-26 DIAGNOSIS — Z930 Tracheostomy status: Secondary | ICD-10-CM | POA: Diagnosis not present

## 2023-11-26 LAB — CBC WITH DIFFERENTIAL/PLATELET
Abs Immature Granulocytes: 0.03 K/uL (ref 0.00–0.07)
Basophils Absolute: 0 K/uL (ref 0.0–0.1)
Basophils Relative: 1 %
Eosinophils Absolute: 0.2 K/uL (ref 0.0–0.5)
Eosinophils Relative: 2 %
HCT: 33.5 % — ABNORMAL LOW (ref 39.0–52.0)
Hemoglobin: 11.2 g/dL — ABNORMAL LOW (ref 13.0–17.0)
Immature Granulocytes: 0 %
Lymphocytes Relative: 16 %
Lymphs Abs: 1.3 K/uL (ref 0.7–4.0)
MCH: 32.9 pg (ref 26.0–34.0)
MCHC: 33.4 g/dL (ref 30.0–36.0)
MCV: 98.5 fL (ref 80.0–100.0)
Monocytes Absolute: 0.9 K/uL (ref 0.1–1.0)
Monocytes Relative: 11 %
Neutro Abs: 5.6 K/uL (ref 1.7–7.7)
Neutrophils Relative %: 70 %
Platelets: 409 K/uL — ABNORMAL HIGH (ref 150–400)
RBC: 3.4 MIL/uL — ABNORMAL LOW (ref 4.22–5.81)
RDW: 11.3 % — ABNORMAL LOW (ref 11.5–15.5)
WBC: 8 K/uL (ref 4.0–10.5)
nRBC: 0 % (ref 0.0–0.2)

## 2023-11-26 LAB — BASIC METABOLIC PANEL WITH GFR
Anion gap: 11 (ref 5–15)
BUN: <5 mg/dL — ABNORMAL LOW (ref 6–20)
CO2: 27 mmol/L (ref 22–32)
Calcium: 8.8 mg/dL — ABNORMAL LOW (ref 8.9–10.3)
Chloride: 95 mmol/L — ABNORMAL LOW (ref 98–111)
Creatinine, Ser: 0.49 mg/dL — ABNORMAL LOW (ref 0.61–1.24)
GFR, Estimated: >60 mL/min (ref >60)
Glucose, Bld: 93 mg/dL (ref 70–99)
Potassium: 3.8 mmol/L (ref 3.5–5.1)
Sodium: 133 mmol/L — ABNORMAL LOW (ref 135–145)

## 2023-11-26 LAB — UREA NITROGEN, URINE: Urea Nitrogen, Ur: 226 mg/dL

## 2023-11-26 LAB — GLUCOSE, CAPILLARY
Glucose-Capillary: 95 mg/dL (ref 70–99)
Glucose-Capillary: 94 mg/dL (ref 70–99)
Glucose-Capillary: 107 mg/dL — ABNORMAL HIGH (ref 70–99)
Glucose-Capillary: 119 mg/dL — ABNORMAL HIGH (ref 70–99)
Glucose-Capillary: 100 mg/dL — ABNORMAL HIGH (ref 70–99)
Glucose-Capillary: 103 mg/dL — ABNORMAL HIGH (ref 70–99)

## 2023-11-26 LAB — PHOSPHORUS: Phosphorus: 3.8 mg/dL (ref 2.5–4.6)

## 2023-11-26 LAB — MAGNESIUM: Magnesium: 1.7 mg/dL (ref 1.7–2.4)

## 2023-11-26 MED ORDER — ADULT MULTIVITAMIN W/MINERALS CH
1.0000 | ORAL_TABLET | Freq: Every day | ORAL | 0 refills | Status: AC
  Filled 2023-11-26: qty 30, 30d supply, fill #0

## 2023-11-26 MED ORDER — ALBUTEROL SULFATE (2.5 MG/3ML) 0.083% IN NEBU
2.5000 mg | INHALATION_SOLUTION | RESPIRATORY_TRACT | 12 refills | Status: AC | PRN
  Filled 2023-11-26: qty 90, 3d supply, fill #0

## 2023-11-26 MED ORDER — FREE WATER: 200.0000 mL | Freq: Three times a day (TID) | Status: AC

## 2023-11-26 MED ORDER — ACETAMINOPHEN 500 MG PO TABS
500.0000 mg | ORAL_TABLET | Freq: Three times a day (TID) | ORAL | 0 refills | Status: AC | PRN
  Filled 2023-11-26: qty 20, 7d supply, fill #0

## 2023-11-26 MED ORDER — OSMOLITE 1.5 CAL PO LIQD: 1000.0000 mL | ORAL | Status: AC

## 2023-11-26 MED ORDER — FREE WATER
200.0000 mL | Freq: Three times a day (TID) | Status: DC
Start: 2023-11-26 — End: 2023-11-27
  Administered 2023-11-26 – 2023-11-27 (×4): 200 mL

## 2023-11-26 MED ORDER — DOCUSATE SODIUM 50 MG/5ML PO LIQD
100.0000 mg | Freq: Two times a day (BID) | ORAL | 0 refills | Status: AC | PRN
  Filled 2023-11-26: qty 100, 5d supply, fill #0

## 2023-11-26 MED ORDER — IPRATROPIUM-ALBUTEROL 0.5-2.5 (3) MG/3ML IN SOLN
3.0000 mL | Freq: Two times a day (BID) | RESPIRATORY_TRACT | Status: DC
  Administered 2023-11-26 – 2023-11-27 (×2): 3 mL via RESPIRATORY_TRACT
  Filled 2023-11-26 (×2): qty 3

## 2023-11-26 MED ORDER — PROSOURCE TF20 ENFIT COMPATIBL EN LIQD
60.0000 mL | Freq: Two times a day (BID) | ENTERAL | 5 refills | Status: AC
  Filled 2023-11-26: qty 60, 1d supply, fill #0

## 2023-11-26 MED ORDER — FREE WATER: 100.0000 mL | Status: DC

## 2023-11-26 MED ORDER — POLYETHYLENE GLYCOL 3350 17 GM/SCOOP PO POWD
17.0000 g | Freq: Every day | ORAL | 0 refills | Status: AC | PRN
  Filled 2023-11-26: qty 238, 14d supply, fill #0

## 2023-11-26 NOTE — Plan of Care (Signed)
 ENT Note: Discussed with patient regarding his pathology results - squamous cell carcinoma. Tracheostomy and breathing wise doing well. We also discussed his case at tumor board -- planning chemotherapy and radiation, which patient wishes for. Would not like to undergo surgery currently.   Will try to expedite his discharge, so that he can get started with therapy as soon as possible given how aggressive his tumor is. Please page ENT at discharge to coordinate care (referring to radiation and oncology) Bill Yohn B Thy Gullikson

## 2023-11-26 NOTE — TOC Progression Note (Addendum)
 Transition of Care [facility]) - Progression Note    Patient Details  Name: Jerome Washington MRN: [id number] Date of Birth: [birth year]  [facility] of [facility] CM/SW Contact  [name], RN Phone Number: 11/26/2023, 9:50 AM  Clinical Narrative:     DME from [employer]- [phone] Hospital bed and nebulizer to be delivered to the house today Suction to be delivered to the room today Trach supplies to be delivered to the home tomorrow O2- does not qualify, discussed with Dr Dennise, and MD states he does not need home O2; RA for last 36 hours   Tube feeds Pam, [employer]  Tube feed supplies to be delivered to the home tonight Pam will do teaching this afternoon  Trach Education- April White RT requested to do trach education with patient and family today  **[facility] cannot accept until trach education done with patient and caregiver, and it is documented in the chart.   Personal duty nursing care- Macie Myer Moellers- [phone] Personal aid care 3 hours a day [facility] nursing [phone]  Spoke w patient's mother and updated on plan for DC tomorrow and delivery of DME. Spoke w friend Chell who is a engineer, civil (consulting) and very close with the family. She states she is competent with care for trachs and tube feeds and will be assisting at home as well.   OP SLP referral made to [facility]  Expected Discharge Plan: Home w Home Health Services Barriers to Discharge: Continued Medical Work up               Expected Discharge Plan and Services   Discharge Planning Services: CM Consult Post Acute Care Choice: Durable Medical Equipment, Home Health Living arrangements for the past 2 months: Single Family Home Expected Discharge Date: 11/26/23               DME Arranged: [place] feeding   Date DME Agency Contacted: 11/20/23 Time DME Agency Contacted: 1214 Representative spoke with at DME Agency: [name]             Social Drivers of Health (SDOH) Interventions SDOH Screenings    Food Insecurity: No Food Insecurity (11/18/2023)  Housing: Low Risk  (11/18/2023)  Transportation Needs: No Transportation Needs (11/18/2023)  Utilities: Not At Risk (11/18/2023)  Tobacco Use: High Risk (11/17/2023)    Readmission Risk Interventions     No data to display

## 2023-11-26 NOTE — Plan of Care (Signed)
  Problem: Clinical Measurements: Goal: Respiratory complications will improve Outcome: Progressing   Problem: Activity: Goal: Risk for activity intolerance will decrease Outcome: Progressing   Problem: Safety: Goal: Ability to remain free from injury will improve Outcome: Progressing   Problem: Skin Integrity: Goal: Risk for impaired skin integrity will decrease Outcome: Progressing

## 2023-11-26 NOTE — Progress Notes (Signed)
 Family unable to be at bedside today. Case worker stated family friend is a [employer] and would be helping with trach at home with mother. Trach education was done with patient. Educated on who/when to call for emergencies, cleaning and daily maintenance for trach, changing inner cannula and dressing, cleaning skin around trach. Trach education booklet was given to patient as well and was looked over while educating. Patient verbally spoke back what to do for education and was able to do tasks such as changing inner cannula on his own. Patient stated he had no further questions but if he did he would call RT.

## 2023-11-26 NOTE — Discharge Summary (Signed)
 Jerome Washington FMW:[id number] DOB: [birth year] DOA: 11/17/2023  PCP: Benjamin [name], NP  Admit date: 11/17/2023  Discharge date: 11/26/2023  Admitted From: Home   Disposition:  Home   Recommendations for Outpatient Follow-up:   Follow up with PCP in 1-2 weeks  PCP Please obtain BMP/CBC, 2 view CXR in 1week,  (see Discharge instructions)   PCP Please follow up on the following pending results: With close outpatient follow-up with ENT and oncology   Home Health: RN, PT, speech therapy, social work if he qualifies Equipment/Devices: See below Consultations: Marceline, ENT, IR Discharge Condition: Stable    CODE STATUS: Full    Diet Recommendation: N.p.o. by mouth, PEG tube feeds and all medications via PEG tube.    No chief complaint on file.    Brief history of present illness from the day of admission and additional interim summary    53 y.o.  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  Subsequently admitted to the ICU postoperatively-stabilized and then transferred to [facility].    Significant events: 10/28>> admit by ENT-tracheostomy/biopsy-transferred to ICU 10/29>> transferred to [facility] 10/30>> G-tube by IR   Significant studies: 10/26>> CT soft tissue neck: 6.3 x 3.2 x 4.3 mass in the posterior oropharynx-enlarged right posterior lateral retropharyngeal lymph node.   Significant microbiology data: None   Procedures: 10/28>> tracheotomy-laryngoscopy with biopsy. 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  Seen by ENT, postdischarge will follow-up with ENT and oncology. Developed dysphagia post surgery now on tube feeds via PEG tube which was placed by IR.  Failed swallow eval multiple times.   Acute hypoxic respiratory failure after tracheostomy due to an episode of aspiration pneumonia late at night 11/20/2023.  Currently on PEG tube feeds with tube feed rate at 35 cc an hour and free water flushes 200 cc every 8, with higher rate he had aspiration pneumonia and developed acute hypoxic respiratory failure, this has resolved after he was given chest PT, Unasyn, pulmonary toiletry this problem has resolved now on room air for the last 24 hours.  He is now symptom-free.   S/p tracheostomy and dysphagia.  On peg tube feeds.  N.p.o. by mouth as above.    Hypophosphatemia/hypomagnesemia/Hypokalemia Replaced   Hyponatremia with hypotension.  Fluid with hydration.   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  N.p.o. by mouth.   Moderate PCM.  Placed on protein supplement.  Discharge diagnosis     Principal Problem:   Status post tracheostomy [facility]) Active Problems:   Mass of oropharynx   Respiratory distress   Oropharyngeal cancer (HCC)   Protein-calorie malnutrition, severe    Discharge instructions    Discharge Instructions     Discharge instructions   Complete by: As directed    Follow with Primary MD [name], [name], NP in 7 days   Get CBC, CMP, Magnesium , 2 view Chest X ray -  checked next visit with your primary MD    Activity: As tolerated with Full fall precautions use walker/cane &  assistance as needed  Disposition Home    Diet: N.p.o. by mouth.  All medications and diet via the PEG tube  Special Instructions: If you have smoked or chewed Tobacco  in the last 2 yrs please stop smoking, stop any regular Alcohol  and or any Recreational drug use.  On your next visit with your primary care physician please Get Medicines reviewed and adjusted.  Please request your Prim.MD to go over all Hospital Tests and Procedure/Radiological results at the follow up, please get all Hospital records sent to your Prim MD by signing hospital release before you go home.  If you experience worsening of your admission symptoms, develop shortness of breath, life threatening emergency, suicidal or homicidal thoughts you must seek medical attention immediately by calling 911 or calling your MD immediately  if symptoms less severe.  You Must read complete instructions/literature along with all the possible adverse reactions/side effects for all the Medicines you take and that have been prescribed to you. Take any new Medicines after you have completely understood and accpet all the possible adverse reactions/side effects.   Do not drive when taking Pain medications.  Do not take more than prescribed Pain, Sleep and Anxiety Medications  Wear Seat belts while driving.   Discharge wound care:   Complete by: As directed    Routine trach site care per RT, PEG tube site care per home nursing and patient daily as needed.   Increase activity slowly   Complete by: As directed        Discharge Medications   Allergies as of 11/26/2023       Reactions   Bee Venom Shortness Of Breath, Swelling   Morphine  And Codeine  Nausea And Vomiting   Penicillins    Tolerated Unasyn  11/25        Medication List     STOP taking these medications    chlordiazePOXIDE  25 MG capsule Commonly known as: LIBRIUM    Debrox 6.5 % OTIC solution Generic drug: carbamide peroxide   gabapentin 300 MG  capsule Commonly known as: NEURONTIN   ibuprofen  800 MG tablet Commonly known as: ADVIL    loratadine 10 MG tablet Commonly known as: CLARITIN   Menthol  (Topical Analgesic) 4 % Gel Commonly known as: Biofreeze   methocarbamol  500 MG tablet Commonly known as: ROBAXIN    methylPREDNISolone  4 MG Tbpk tablet Commonly known as: MEDROL  DOSEPAK   predniSONE  20 MG tablet Commonly known as: DELTASONE    propranolol 10 MG tablet Commonly known as: INDERAL   traZODone 50 MG tablet Commonly known as: DESYREL       TAKE these medications    acetaminophen  500 MG  tablet Commonly known as: TYLENOL  Place 1 tablet (500 mg total) into feeding tube every 8 (eight) hours as needed.   albuterol (2.5 MG/3ML) 0.083% nebulizer solution Commonly known as: PROVENTIL Take 3 mLs (2.5 mg total) by nebulization every 2 (two) hours as needed for wheezing or shortness of breath.   docusate 50 MG/5ML liquid Commonly known as: COLACE Place 10 mLs (100 mg total) into feeding tube 2 (two) times daily as needed for mild constipation.   feeding supplement (OSMOLITE 1.5 CAL) Liqd Place 1,000 mLs into feeding tube continuous. 35cc/hr   feeding supplement (PROSource TF20) liquid Place 60 mLs into feeding tube 2 (two) times daily.   fluticasone 50 MCG/ACT nasal spray Commonly known as: FLONASE Place 1 spray into both nostrils 2 (two) times daily as needed for allergies.   free water Soln Place 200 mLs into feeding tube every 8 (eight) hours.   multivitamin with minerals Tabs tablet Place 1 tablet into feeding tube daily.   polyethylene glycol 17 g packet Commonly known as: MIRALAX / GLYCOLAX Take 17 g by mouth daily as needed for moderate constipation.               Durable Medical Equipment  (From admission, onward)           Start     Ordered   11/26/23 0904  For home use only DME Nebulizer/meds  Once       Question Answer Comment  Patient needs a nebulizer to treat with the  following condition COPD (chronic obstructive pulmonary disease) (HCC)   Length of Need 12 Months      11/26/23 0903   11/25/23 1234  For home use only DME Tube feeding  Once       Comments:  Recommended TF schedule on discharge:  Nutren 2.0 @36  ml/hr (864 ml per day)  Total 3.5 cartons daily  When able to tolerate free water flushes 100ml q4h -  (600 ml per day) - will need to monitor lab trends to assess fluid status  Provides  1728 kcals, 73g protein and 1198 ml free water per day (FWF + TF)   11/25/23 1233   11/25/23 1233  For home use only DME Tube feeding pump  Once       Comments: Pump required for continuous tube feeds due to risk for aspitation  Question:  Length of Need  Answer:  Lifetime   11/25/23 1233   11/25/23 1018  For home use only DME oxygen  Once       Comments: Trach collar, 6 cuff less Trach  Question Answer Comment  Length of Need 6 Months   Mode or (Route) Nasal cannula   Liters per Minute 6   Frequency Continuous (stationary and portable oxygen unit needed)   Oxygen conserving device Yes   Oxygen delivery system: Gas      11/25/23 1017   11/20/23 1557  For home use only DME Trach supplies  (For Home Use Only DME Trach Supplies)  Once       Question Answer Comment  Trach Type Shiley   Back Up Trach Type Shiley   Cuffed or Uncuffed Uncuffed   Size 6UN as primary trach, 4 UN as Radio Broadcast Assistant for Home Use Tube Holder/Collar   Trach Supplies/Equipment for Home Use Tracheostomy Drain Dressings   Trach Supplies/Equipment for Home Use Medical Suction Machine   Trach Supplies/Equipment for Home Use Suction Catheters   Trach Supplies/Equipment for Home Use  Speaking Valve   Suction Catheter Size 10 French (for size 4)      11/20/23 1557   11/20/23 1354  For home use only DME Hospital bed  Once       Comments: Therapuetic mattress  Question Answer Comment  Length of Need Lifetime   Patient has (list medical condition): tracheostomy    The above medical condition requires: Patient requires the ability to reposition frequently   Head must be elevated greater than: 45 degrees   Bed type Semi-electric      11/20/23 1354              Discharge Care Instructions  (From admission, onward)           Start     Ordered   11/26/23 0000  Discharge wound care:       Comments: Routine trach site care per RT, PEG tube site care per home nursing and patient daily as needed.   11/26/23 0906             Follow-up Information     [facility] [place]. Call.   Specialty: Rehabilitation Why: Call to schedule an appointment for fastest scheduling Contact information: [address] [place]  [address] [phone]        Benjamin [name], NP. Schedule an appointment as soon as possible for a visit in 1 week(s).   Contact information: [address] [place] [address] [phone]         Tobie Eldora NOVAK, MD. Schedule an appointment as soon as possible for a visit in 1 week(s).   Specialty: Otolaryngology Contact information: [address], [address] [place] [address] [phone]         Timmy Maude SAUNDERS, MD. Schedule an appointment as soon as possible for a visit in 1 week(s).   Specialty: Oncology Why: Squamous cell laryngeal cancer Contact information: [address] [place] [address] [phone]                 Major procedures and Radiology Reports - PLEASE review detailed and final reports thoroughly  -      DG Swallowing Func-Speech Pathology Result Date: 11/25/2023 Table formatting from the original result was not included. Modified Barium Swallow Study Patient Details Name: Jerome Washington MRN: [id number] Date of Birth: 05/07/1970 Today's Date: 11/25/2023 HPI/PMH: HPI: Patient is a 53 y.o. male who presented 10/28 for operative treatment of oropharynx mass. Pt  underwent tracheostomy on 10/28 due to respiratory distress. 6 cuffed trach initially placed, plan for 4 cuffless eventually. ENT reports Large oropharynx mass almost 270 degrees; tongue base did not appear involved but difficult to see entirety given bulk of mass. Distal airway including epiglottis and larynx not involved. 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 Pt has reported recent difficulty swallowing and has been observed to be malnourished. Pt had PEG tube placed on 11/18/23 to supplement nutrition. Trach changed to #6 cuffless 10/31. CXR 11/1 shows LLL opacity, concerning for aspiration. MBS attempted 10/31 but unsuccessful due to period of desaturation. Required HHF 11/1-11/4. MBS 11/5 showed diffuse pharyngeal residue secondary to reduced BOT retraction, large oropharyngeal mass, and prominent CP bar resulting in silent aspiration after the swallow of liquids and frank penetration of purees. Clinical Impression: Pt exhibits profound oropharyngeal dysphagia secondary to the chronicity of aspiration and the  volume of residue regardless of consistency. The large oropharyngeal mass protrudes from the posterior pharyngeal wall at the level of the epiglottis, prohibiting inversion, though this seems to benefit laryngeal closure during the swallow as the mass approximates laryngeal structures. Airway invasion occurs most readily after the swallow due to the volume that accumulates within the pyriform sinuses. Aspiration is not consistently sensed and a cued cough, although forceful, does not clear aspirates (PAS 8). Residue increases with purees, resulting in frank penetration (PAS 5). Multiple effortful swallows clear some residuals but not all. This is significantly impacted by a prominent cricopharyngeus, allowing only minimal amounts of bolus to clear the pharynx with each swallow. A chin tuck and bilateral head turns do not yield improved airway  protection or decreased residue. The super supraglottic swallow maneuver appeared to allow improved PES distension, though pyriform sinus residue remains diffuse and risk of post-prandial aspiration exists.  Per chart review and pt report, he had been experiencing pain with swallowing since August 2025. Although there are no previous instrumental studies to compare, suspect both the oropharyngeal mass and prominent cricopharyngeus have been present PTA with pt remaining on a regular diet without adverse pulmonary consequences. He developed pneumonia while admitted and is currently at increased risk secondary to limited mobility, trach placement, and deconditioning. Recommend he remain NPO with ongoing f/u on an OP basis pending biopsy results and subsequent plan. Factors that may increase risk of adverse event in presence of aspiration Noe & Lianne 2021): Factors that may increase risk of adverse event in presence of aspiration Noe & Lianne 2021): Poor general health and/or compromised immunity; Frail or deconditioned; Presence of tubes (ETT, trach, NG, etc.) Recommendations/Plan: Swallowing Evaluation Recommendations Swallowing Evaluation Recommendations Recommendations: NPO Medication Administration: Via alternative means Oral care recommendations: Oral care QID (4x/day); Oral care before ice chips/water Treatment Plan Treatment Plan Treatment recommendations: Therapy as outlined in treatment plan below Follow-up recommendations: Outpatient SLP Functional status assessment: Patient has had a recent decline in their functional status and demonstrates the ability to make significant improvements in function in a reasonable and predictable amount of time. Treatment frequency: Min 2x/week Treatment duration: 2 weeks Interventions: Aspiration precaution training; Compensatory techniques; Patient/family education; Trials of upgraded texture/liquids Recommendations Recommendations for follow up therapy are one  component of a multi-disciplinary discharge planning process, led by the attending physician.  Recommendations may be updated based on patient status, additional functional criteria and insurance authorization. Assessment: Orofacial Exam: Orofacial Exam Oral Cavity: Oral Hygiene: WFL Oral Cavity - Dentition: Edentulous Orofacial Anatomy: WFL Oral Motor/Sensory Function: WFL Anatomy: Anatomy: Suspected cervical osteophytes; Prominent cricopharyngeus Boluses Administered: Boluses Administered Boluses Administered: Thin liquids (Level 0); Mildly thick liquids (Level 2, nectar thick); Puree  Oral Impairment Domain: Oral Impairment Domain Lip Closure: No labial escape Tongue control during bolus hold: Cohesive bolus between tongue to palatal seal Bolus preparation/mastication: Timely and efficient chewing and mashing Bolus transport/lingual motion: Brisk tongue motion Oral residue: Trace residue lining oral structures Location of oral residue : Tongue; Palate Initiation of pharyngeal swallow : Pyriform sinuses  Pharyngeal Impairment Domain: Pharyngeal Impairment Domain Soft palate elevation: Trace column of contrast or air between SP and PW Laryngeal elevation: Partial superior movement of thyroid cartilage/partial approximation of arytenoids to epiglottic petiole Anterior hyoid excursion: Partial anterior movement Epiglottic movement: No inversion Laryngeal vestibule closure: Complete, no air/contrast in laryngeal vestibule Pharyngeal stripping wave : Present - diminished Pharyngeal contraction (A/P view only): N/A Pharyngoesophageal segment opening: Minimal distention/minimal duration, marked obstruction of flow  Tongue base retraction: Wide column of contrast or air between tongue base and PPW Pharyngeal residue: Majority of contrast within or on pharyngeal structures Location of pharyngeal residue: Pharyngeal wall; Pyriform sinuses  Esophageal Impairment Domain: No data recorded Pill: No data recorded  Penetration/Aspiration Scale Score: Penetration/Aspiration Scale Score 5.  Material enters airway, CONTACTS cords and not ejected out: Puree 8.  Material enters airway, passes BELOW cords without attempt by patient to eject out (silent aspiration) : Thin liquids (Level 0); Mildly thick liquids (Level 2, nectar thick) Compensatory Strategies: Compensatory Strategies Compensatory strategies: Yes Effortful swallow: Ineffective Ineffective Effortful Swallow: Thin liquid (Level 0); Mildly thick liquid (Level 2, nectar thick); Puree Multiple swallows: Ineffective Ineffective Multiple Swallows: Thin liquid (Level 0); Mildly thick liquid (Level 2, nectar thick); Puree Chin tuck: Ineffective Ineffective Chin Tuck: Thin liquid (Level 0) Left head turn: Ineffective Ineffective Left Head Turn: Thin liquid (Level 0) Right head turn: Ineffective Ineffective Right Head Turn: Thin liquid (Level 0) Super supraglottic swallow: Effective Effective Super Supraglottic Swallow: Thin liquid (Level 0)   General Information: Caregiver present: No  Diet Prior to this Study: NPO; G-tube   Temperature : Normal   Respiratory Status: WFL   Supplemental O2: None (Room air)   History of Recent Intubation: No  Behavior/Cognition: Alert; Cooperative; Pleasant mood Self-Feeding Abilities: Able to self-feed Baseline vocal quality/speech: Dysphonic Volitional Cough: Able to elicit Volitional Swallow: Able to elicit Exam Limitations: No limitations Goal Planning: Prognosis for improved oropharyngeal function: Good Barriers to Reach Goals: Time post onset; Severity of deficits No data recorded Patient/Family Stated Goal: none stated Consulted and agree with results and recommendations: Patient Pain: Pain Assessment Pain Assessment: No/denies pain End of Session: Start Time:SLP Start Time (ACUTE ONLY): 1145 Stop Time: SLP Stop Time (ACUTE ONLY): 1205 Time Calculation:SLP Time Calculation (min) (ACUTE ONLY): 20 min Charges: SLP Evaluations $ SLP Speech  Visit: 1 Visit SLP Evaluations $MBS Swallow: 1 Procedure $Speech Treatment for Individual: 1 Procedure SLP visit diagnosis: SLP Visit Diagnosis: Dysphagia, oropharyngeal phase (R13.12) Past Medical History: Past Medical History: Diagnosis Date  Cancer (HCC)   mouth  Chronic back pain   Hypertension   Myocardial infarction (HCC)   Sclerosis, hereditary spinal (HCC)  Past Surgical History: Past Surgical History: Procedure Laterality Date  IR GASTROSTOMY TUBE MOD SED  11/19/2023  MICROLARYNGOSCOPY WITH LASER N/A 11/17/2023  Procedure: DIRECT LARYNOSCOPY WITH BIOPSY;  Surgeon: Tobie Eldora NOVAK, MD;  [facility];  Service: ENT;  Laterality: N/A;  NECK SURGERY    mouth cancer  TRACHEOSTOMY    TRACHEOSTOMY TUBE PLACEMENT N/A 11/17/2023  Procedure: CREATION, TRACHEOSTOMY;  Surgeon: Tobie Eldora NOVAK, MD;  Location: [facility] OR;  Service: ENT;  Laterality: N/A;  AWAKE Tracheostomy v/s Fiberoptic intubation, direct laryngoscopy with Biopsy Damien Blumenthal, M.A., CCC-SLP Speech Language Pathology, Acute Rehabilitation Services Secure Chat preferred [phone] 11/25/2023, 1:32 PM  DG Chest Port 1 View Result Date: 11/24/2023 EXAM: 1 VIEW(S) XRAY OF THE CHEST 11/24/2023 06:30:00 AM COMPARISON: 11/22/2023 CLINICAL HISTORY: SOB (shortness of breath) FINDINGS: LINES, TUBES AND DEVICES: Tracheostomy tube in place. Percutaneous gastrostomy tube noted. LUNGS AND PLEURA: Improved aeration of left lung base. Decreased left basilar opacity. No pulmonary edema. No pleural effusion. No pneumothorax. HEART AND MEDIASTINUM: No acute abnormality of the cardiac and mediastinal silhouettes. BONES AND SOFT TISSUES: No acute osseous abnormality. IMPRESSION: 1. Improved aeration of the left lung base with decreased left basilar opacity. Electronically signed by: Waddell Calk MD 11/24/2023 07:35 AM EST RP Workstation: [id number]  DG Chest Port 1 View Result Date: 11/22/2023 EXAM: 1 AP VIEW XRAY OF THE CHEST 11/22/2023 05:07:00 AM COMPARISON: AP  radiograph of the chest dated 11/21/2023. CLINICAL HISTORY: SOB (shortness of breath). FINDINGS: LINES, TUBES AND DEVICES: A tracheostomy tube remains in satisfactory position. The gastrostomy tube is present. There are also surgical clips in the left upper quadrant. LUNGS AND PLEURA: Persistent elevation of the left hemidiaphragm with mild hazy opacification of the left medial lung base. No pulmonary edema. No pleural effusion. No pneumothorax. HEART AND MEDIASTINUM: No acute abnormality of the cardiac and mediastinal silhouettes. BONES AND SOFT TISSUES: No acute osseous abnormality. IMPRESSION: 1. Persistent elevation of the left hemidiaphragm with mild hazy opacification of the left medial lung base. 2. Tracheostomy tube in satisfactory position. 3. Gastrostomy tube in place. Electronically signed by: Evalene Coho MD 11/22/2023 05:12 AM EST RP Workstation: [id number]   DG Chest Port 1 View Result Date: 11/21/2023 CLINICAL DATA:  Shortness of breath EXAM: PORTABLE CHEST 1 VIEW COMPARISON:  11/21/2023 at 12:08 a.m. FINDINGS: Single frontal view of the chest demonstrates stable tracheostomy tube. Continued dense consolidation at the medial left lung base without appreciable change since prior study. No new airspace disease, effusion, or pneumothorax. Cardiac silhouette is stable. No acute bony abnormalities. IMPRESSION: 1. Persistent retrocardiac left lower lobe consolidation consistent with pneumonia or atelectasis. Electronically Signed   By: Ozell Daring M.D.   On: 11/21/2023 12:46   DG Chest Port 1 View Result Date: 11/21/2023 EXAM: 1 VIEW(S) XRAY OF THE CHEST 11/21/2023 12:19:35 AM COMPARISON: 11/20/2023 CLINICAL HISTORY: [id number] Acute respiratory distress FINDINGS: LINES, TUBES AND DEVICES: Tracheostomy is unchanged. LUNGS AND PLEURA: Left lower lobe airspace opacity increased since prior study. No pulmonary edema. No pleural effusion. No pneumothorax. HEART AND MEDIASTINUM: No acute abnormality of  the cardiac and mediastinal silhouettes. BONES AND SOFT TISSUES: Right supraclavicular subcutaneous emphysema has resolved. No acute osseous abnormality. IMPRESSION: 1. Increased left lower lobe airspace opacity since prior study. Appearance concerning for pneumonia. 2. Resolved right supraclavicular subcutaneous emphysema. Electronically signed by: Franky Crease MD 11/21/2023 12:28 AM EDT RP Workstation: [id number]   DG Chest Port 1 View Result Date: 11/20/2023 EXAM: 1 VIEW(S) XRAY OF THE CHEST 11/20/2023 07:41:00 AM COMPARISON: 11/17/2023 CLINICAL HISTORY: 200808 Hypoxia [id number] FINDINGS: LINES, TUBES AND DEVICES: Tracheostomy tube in place. LUNGS AND PLEURA: Elevated left hemidiaphragm. Mild patchy left lung base opacity, slightly increased. No pulmonary edema. No pleural effusion. No pneumothorax. HEART AND MEDIASTINUM: No acute abnormality of the cardiac and mediastinal silhouettes. BONES AND SOFT TISSUES: Similar mild subcutaneous emphysema in right supraclavicular region. No acute osseous abnormality. IMPRESSION: 1. Mildly increased left basilar opacity, which may reflect atelectasis or aspiration/pneumonia. 2. Elevated left hemidiaphragm. 3. Tracheostomy tube in place. 4. Similar mild subcutaneous emphysema in the right supraclavicular region. 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 EXAM: PERC PLACEMENT GASTROSTOMY MEDICATIONS: Ancef 2 gm IV; Antibiotics were administered within 1 hour of the procedure. ANESTHESIA/SEDATION: Versed 2 mg IV; Fentanyl  100 mcg IV Moderate Sedation Time:  10 The patient was continuously monitored during the procedure by the interventional radiology nurse under my direct supervision. CONTRAST:  15mL OMNIPAQUE  IOHEXOL  300 MG/ML SOLN - administered into the gastric lumen.  FLUOROSCOPY TIME:  Twelve mGy reference air kerma COMPLICATIONS: None immediate. PROCEDURE: Informed written consent was obtained from the patient  after a thorough discussion of the procedural risks, benefits and alternatives. All questions were addressed. Maximal Sterile barrier Technique was utilized including caps, mask, sterile gowns, sterile gloves, sterile drape, hand hygiene and skin antiseptic. A timeout was performed prior to the initiation of the procedure. The patient was placed on the procedure table in the supine position. Pre-procedure abdominal film confirmed visualization of the transverse colon. An angled 5-French catheter was passed through the nares into the stomach. The patient was prepped and draped in usual sterile fashion. The stomach was insufflated with air via the indwelling nasogastric tube. Under fluoroscopy, a puncture site was selected and local analgesia achieved with 1% lidocaine  infiltrated subcutaneously. Under fluoroscopic guidance, a gastropexy needle was passed into the stomach and the T-bar suture was released. Entry into the stomach was confirmed with fluoroscopy, aspiration of air, and injection of contrast material. This was repeated with an additional gastropexy suture (for a total of 2 fasteners). At the center of these gastropexy sutures, a dermatotomy was performed. An 18 gauge needle was passed into the stomach at the site of this dermatotomy, and position within the gastric lumen again confirmed under fluoroscopy using aspiration of air and contrast injection. An Amplatz guidewire was passed through this needle and intraluminal placement within the stomach was confirmed by fluoroscopy. The needle was removed. Over the guidewire, the percutaneous tract was dilated using a 10 mm non-compliant balloon. The balloon was deflated, then pushed into the gastric lumen followed in concert by the 20 Fr gastrostomy tube. The retention balloon of the percutaneous gastrostomy tube  was inflated with 10 mL of sterile water. The tube was withdrawn until the retention balloon was at the edge of the gastric lumen. The external bumper was brought to the abdominal wall. Contrast was injected through the gastrostomy tube, confirming intraluminal positioning. The patient tolerated the procedure well without any immediate post-procedural complications. IMPRESSION: Technically successful placement of 20 Fr gastrostomy tube. Ester Sides, MD [facility] Electronically Signed   By: Ester Sides M.D.   On: 11/19/2023 16:22   CT ABDOMEN WO CONTRAST Result Date: 11/18/2023 CLINICAL DATA:  53 year old male, preprocedure planning for percutaneous gastrostomy tube placement. EXAM: CT ABDOMEN WITHOUT CONTRAST TECHNIQUE: Multidetector CT imaging of the abdomen was performed following the standard protocol without IV contrast. RADIATION DOSE REDUCTION: This exam was performed according to the departmental dose-optimization program which includes automated exposure control, adjustment of the mA and/or kV according to patient size and/or use of iterative reconstruction technique. COMPARISON:  08/17/2022 FINDINGS: Lower chest: No acute abnormality. Hepatobiliary: No focal liver abnormality is seen. No gallstones, gallbladder wall thickening, or biliary dilatation. Pancreas: Unremarkable. No pancreatic ductal dilatation or surrounding inflammatory changes. Spleen: Normal in size without focal abnormality. Adrenals/Urinary Tract: Adrenal glands are unremarkable. Punctate left inferior pole nonobstructive renal calculus. The kidneys are otherwise normal. Stomach/Bowel: Stomach is within normal limits. Appendix appears normal. No evidence of bowel wall thickening, distention, or inflammatory changes. Vascular/Lymphatic: Aortic atherosclerosis. No enlarged abdominal lymph nodes. Other: No abdominal wall hernia or abnormality. Musculoskeletal: No acute or  significant osseous findings. IMPRESSION: 1. Favorable anatomy for percutaneous gastrostomy tube placement. 2.  Aortic Atherosclerosis (ICD10-I70.0). Ester Sides, MD [facility] Radiology Electronically Signed   By: Ester Sides M.D.   On: 11/18/2023 15:04   DG CHEST PORT 1 VIEW Result Date: 11/17/2023 EXAM: 1 VIEW(S) XRAY OF THE CHEST 11/17/2023 07:09:00 PM COMPARISON: Comparison is made with 11/15/2023. CLINICAL HISTORY:  [id number] Status post tracheostomy (HCC) [id number]. FINDINGS: LINES, TUBES AND DEVICES: Interval placement of tracheostomy tube tip about 3.6 cm superior to the carina. LUNGS AND PLEURA: Mild atelectasis at the left base. No focal pulmonary opacity. No pulmonary edema. No pleural effusion. No pneumothorax. HEART AND MEDIASTINUM: No acute abnormality of the cardiac and mediastinal silhouettes. BONES AND SOFT TISSUES: No acute osseous abnormality. IMPRESSION: 1. Tracheostomy tube placement as above. 2. Mild left basilar atelectasis. Electronically signed by: Luke Bun MD 11/17/2023 08:20 PM EDT RP Workstation: [id number]   CT Soft Tissue Neck W Contrast Result Date: 11/15/2023 EXAM: CT NECK WITH CONTRAST 11/15/2023 01:42:25 PM TECHNIQUE: CT of the neck was performed with the administration of 75 mL of iohexol  (OMNIPAQUE ) 300 MG/ML solution. Multiplanar reformatted images are provided for review. Automated exposure control, iterative reconstruction, and/or weight based adjustment of the mA/kV was utilized to reduce the radiation dose to as low as reasonably achievable. COMPARISON: CT neck with contrast 07/20/2019 at [facility]. The images are not currently available. CLINICAL HISTORY: Soft tissue infection suspected, neck, xray done. Pt to er, pt states that he was here a couple of weeks ago for the same, states that he was given abx for a sinus infection, but he is still having the sinus problems. FINDINGS: LIMITATIONS/ARTIFACTS: The study  is mildly degraded by patient motion. AERODIGESTIVE TRACT: Status post partial glossectomy and neck dissection is noted. Diffuse mass-like mucosal thickening within the posterior oropharynx measures 6.3 x 3.2 x 4.3 cm, crossing midline. A 14 mm low density collection or lesion is present at the right glosso-tonsillar sulcus. SALIVARY GLANDS: A 4 mm calcification is present anteriorly in the right parotid gland. A subcutaneous cystic lesion creates some mass effect on the left parotid gland. The left parotid gland is otherwise unremarkable. The submandibular glands have been resected bilaterally. THYROID: Unremarkable. LYMPH NODES: A right posterolateral retropharyngeal lymph node measures 2.3 x 1.5 cm. No other cervical adenopathy is present. SOFT TISSUES: A subcutaneous moderate density cystic lesion over the left parotid gland measures 3.2 x 2.0 x 2.8 cm. A slightly higher density subcutaneous lesion is present anteriorly in the left cheek measuring 1.7 x 1.4 cm. BRAIN, ORBITS, SINUSES AND MASTOIDS: No acute abnormality. LUNGS AND MEDIASTINUM: No acute abnormality. BONES: Chronic endplate sclerotic changes are present at C5-C6 with uncovertebral and foraminal narrowing bilaterally. An anterior osteophyte at C5 is fractured anteriorly. IMPRESSION: 1. Diffuse mass-like mucosal thickening within the posterior oropharynx measuring 6.3 x 3.2 x 4.3 cm, crossing midline, with a 14 mm low-density collection at the right glossotonsillar sulcus. 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 Recommend ent consultation 2. Right posterolateral retropharyngeal lymph node measuring 2.3 x 1.5 cm is consistent with metastatic disease. 3. Subcutaneous cystic lesion over the left parotid gland measuring 3.2 x 2.0 x 2.8 cm, and a subcutaneous lesion in the left cheek measuring 1.7 x 1.4 cm. These are nonspecific these may represent dermoid inclusion cysts. Metastatic disease is considered less likely.  Electronically signed by: Lonni Necessary MD 11/15/2023 02:04 PM EDT RP Workstation: [id number]   DG Chest Portable 1 View Result Date: 11/15/2023 EXAM: 1 VIEW(S) XRAY OF THE CHEST 11/15/2023 12:54:00 PM COMPARISON: 07/21/21 CLINICAL HISTORY: cough. Per chart: Pt to er, pt states that he was here a couple of weeks ago for the same, states that he was given abx for a sinus infection, but he is still having the sinus problems. FINDINGS: LUNGS AND PLEURA: Pulmonary hyperinflation. No focal pulmonary opacity. No pulmonary  edema. No pleural effusion. No pneumothorax. HEART AND MEDIASTINUM: No acute abnormality of the cardiac and mediastinal silhouettes. BONES AND SOFT TISSUES: No acute osseous abnormality. IMPRESSION: 1. No acute cardiopulmonary process identified. Electronically signed by: Waddell Calk MD 11/15/2023 01:02 PM EDT RP Workstation: [id number]    Micro Results    Recent Results (from the past 240 hours)  MRSA Next Gen by PCR, Nasal     Status: None   Collection Time: 11/17/23  6:28 PM   Specimen: Nasal Mucosa; Nasal Swab  Result Value Ref Range Status   MRSA by PCR Next Gen NOT DETECTED NOT DETECTED Final    Comment: (NOTE) The GeneXpert MRSA Assay (FDA approved for NASAL specimens only), is one component of a comprehensive MRSA colonization surveillance program. It is not intended to diagnose MRSA infection nor to guide or monitor treatment for MRSA infections. Test performance is not FDA approved in patients less than 52 years old. Performed at [facility], [address] [place], [place] [address]     Today   Subjective    [facility] today has no headache,no chest abdominal pain,no new weakness tingling or numbness, feels much better wants to go home today.  He is completely free.   Objective   Blood pressure 126/84, pulse 63, temperature 98.3 F (36.8 C), temperature source Oral, resp. rate 17, height 5' 11 (1.803 m), weight 50.6 kg, SpO2  94%.   Intake/Output Summary (Last 24 hours) at 11/26/2023 0908 Last data filed at 11/26/2023 0907 Gross per 24 hour  Intake 200 ml  Output 2350 ml  Net -2150 ml    Exam  Awake Alert, No new F.N deficits,    [place].AT,PERRAL, tracheostomy site clear, on room air.  PEG tube site stable. Supple Neck,   Symmetrical Chest wall movement, Good air movement bilaterally, CTAB RRR,No Gallops,   +ve B.Sounds, Abd Soft, Non tender,  No Cyanosis, Clubbing or edema    Data Review   Recent Labs  Lab 11/22/23 0406 11/23/23 0409 11/24/23 0413 11/25/23 0257 11/26/23 0306  WBC 14.1* 11.4* 8.3 8.6 8.0  HGB 11.7* 11.4* 11.6* 10.7* 11.2*  HCT 35.2* 34.3* 34.8* 32.6* 33.5*  PLT 322 311 370 377 409*  MCV 99.7 100.0 100.0 101.2* 98.5  MCH 33.1 33.2 33.3 33.2 32.9  MCHC 33.2 33.2 33.3 32.8 33.4  RDW 12.0 11.9 11.8 11.5 11.3*  LYMPHSABS 0.8 1.0 0.9 1.1 1.3  MONOABS 1.4* 1.1* 1.0 1.0 0.9  EOSABS 0.0 0.1 0.1 0.1 0.2  BASOSABS 0.0 0.0 0.0 0.0 0.0    Recent Labs  Lab 11/20/23 0800 11/21/23 0319 11/21/23 1048 11/22/23 0406 11/23/23 0409 11/24/23 0413 11/25/23 0257 11/26/23 0306  NA  --    < >  --  136 131* 133* 128* 133*  K  --    < >  --  3.8 3.3* 3.8 3.4* 3.8  CL  --    < >  --  95* 89* 94* 88* 95*  CO2  --    < >  --  31 32 30 30 27   ANIONGAP  --    < >  --  10 10 9 10 11   GLUCOSE  --    < >  --  122* 118* 114* 89 93  BUN  --    < >  --  11 10 12 10  <5*  CREATININE  --    < >  --  0.68 0.63 0.63 0.51* 0.49*  CRP  --   --  3.7* 7.2* 10.5* 7.4*  --   --   PROCALCITON <0.10  --  <0.10 <0.10 <0.10 <0.10  --   --   BNP 45.0  --   --   --   --   --   --   --   MG  --    < >  --  1.7 1.7 1.8 1.7 1.7  PHOS  --    < >  --  3.8 3.3 3.6 3.5 3.8  CALCIUM  --    < >  --  9.9 9.6 9.6 9.2 8.8*   < > = values in this interval not displayed.    Total Time in preparing paper work, data evaluation and todays exam - 35 minutes  Signature  -    Lavada Stank M.D on 11/26/2023 at 9:08 AM   -  To  page go to [email or web address]

## 2023-11-27 ENCOUNTER — Other Ambulatory Visit (HOSPITAL_COMMUNITY): Payer: Self-pay

## 2023-11-27 ENCOUNTER — Other Ambulatory Visit (INDEPENDENT_AMBULATORY_CARE_PROVIDER_SITE_OTHER): Payer: Self-pay

## 2023-11-27 DIAGNOSIS — Z930 Tracheostomy status: Secondary | ICD-10-CM | POA: Diagnosis not present

## 2023-11-27 LAB — GLUCOSE, CAPILLARY
Glucose-Capillary: 108 mg/dL — ABNORMAL HIGH (ref 70–99)
Glucose-Capillary: 88 mg/dL (ref 70–99)
Glucose-Capillary: 98 mg/dL (ref 70–99)

## 2023-11-27 NOTE — Plan of Care (Signed)
  Problem: Education: Goal: Knowledge of General Education information will improve Description: Including pain rating scale, medication(s)/side effects and non-pharmacologic comfort measures Outcome: Progressing   Problem: Health Behavior/Discharge Planning: Goal: Ability to manage health-related needs will improve Outcome: Progressing   Problem: Clinical Measurements: Goal: Ability to maintain clinical measurements within normal limits will improve Outcome: Progressing Goal: Will remain free from infection Outcome: Progressing Goal: Diagnostic test results will improve Outcome: Progressing Goal: Respiratory complications will improve Outcome: Progressing   Problem: Activity: Goal: Risk for activity intolerance will decrease Outcome: Progressing   Problem: Nutrition: Goal: Adequate nutrition will be maintained Outcome: Progressing   Problem: Coping: Goal: Level of anxiety will decrease Outcome: Progressing   Problem: Elimination: Goal: Will not experience complications related to bowel motility Outcome: Progressing Goal: Will not experience complications related to urinary retention Outcome: Progressing   Problem: Pain Managment: Goal: General experience of comfort will improve and/or be controlled Outcome: Progressing   Problem: Safety: Goal: Ability to remain free from injury will improve Outcome: Progressing

## 2023-11-27 NOTE — TOC Progression Note (Addendum)
 Transition of Care [facility]) - Progression Note    Patient Details  Name: Jerome Washington MRN: [id number] Date of Birth: [birth year]  Transition of Care [facility] CM/SW Contact  Luann SHAUNNA [place], [place] Phone Number: 11/27/2023, 12:08 PM  Clinical Narrative:     Pt plan to discharge today. Pam with [employer] confirmed feeding supplies have been delivered and they are aware of pt's DC today. CSW confirmed with Lynwood with [employer] that trach supplies have been delivered and they are ready for DC today. Pt received trach training and ready to DC.   Expected Discharge Plan: Home w Home Health Services Barriers to Discharge: none               Expected Discharge Plan and Services   Discharge Planning Services: CM Consult Post Acute Care Choice: Durable Medical Equipment, Home Health Living arrangements for the past 2 months: Single Family Home Expected Discharge Date: 11/26/23               DME Arranged: [place] feeding   Date DME Agency Contacted: 11/20/23 Time DME Agency Contacted: 1214 Representative spoke with at DME Agency: [name]             Social Drivers of Health (SDOH) Interventions SDOH Screenings   Food Insecurity: No Food Insecurity (11/18/2023)  Housing: Low Risk  (11/18/2023)  Transportation Needs: No Transportation Needs (11/18/2023)  Utilities: Not At Risk (11/18/2023)  Tobacco Use: High Risk (11/17/2023)    Readmission Risk Interventions     No data to display

## 2023-11-27 NOTE — Plan of Care (Signed)

## 2023-11-27 NOTE — Progress Notes (Signed)
 [facility]                                                                                                                                                                         Patient Demographics  Jerome Washington, is a 53 y.o. male  RDW:[id number]  FMW:[id number]  DOB - [birth year]  Admit date - 11/17/2023  Admitting Physician Lonna Coder, MD  Outpatient Primary MD for the patient is [name], [name], NP  LOS - 10   No chief complaint on file.       Assessment & Plan    Patient seen briefly today due for discharge soon per Discharge done yesterday by me, no further issues, Vital signs stable, patient feels fine awaits transportation.      Medications  Scheduled Meds:  Chlorhexidine Gluconate Cloth  6 each Topical Daily   docusate  100 mg Per Tube BID   feeding supplement (PROSource TF20)  60 mL Per Tube Daily   free water  200 mL Per Tube Q8H   heparin injection (subcutaneous)  5,000 Units Subcutaneous Q12H   ipratropium-albuterol  3 mL Nebulization BID   multivitamin with minerals  1 tablet Per Tube Daily   mouth rinse  15 mL Mouth Rinse 4 times per day   polyethylene glycol  17 g Per J Tube BID   Continuous Infusions:  feeding supplement (OSMOLITE 1.5 CAL) 1,000 mL (11/21/23 1147)   PRN Meds:.acetaminophen , albuterol, docusate sodium, fentaNYL  (SUBLIMAZE ) injection, melatonin, mouth rinse, polyethylene glycol    Time Spent in minutes   10 minutes   Lavada Stank M.D on 11/27/2023 at 8:18 AM  Between 7am to 7pm - Pager - [phone]  After 7pm go to [email or web address] - password [email or web address]  And look for the night coverage person covering for me after hours  [facility] Office  [phone]    Subjective:   [facility] today has, No headache, No chest pain, No abdominal pain - No Nausea, No new weakness tingling or numbness, No Cough - SOB.     Objective:   Vitals:   11/26/23 2340 11/27/23 0000 11/27/23 0518 11/27/23 0735  BP:  105/70  (P) 110/72  Pulse:  71 71 (P) 82  Resp: 18 18 18  (P) 20  Temp:  98 F (36.7 C)    TempSrc:  Oral    SpO2:  96% 96% (P) 92%  Weight:      Height:        Wt Readings from Last 3 Encounters:  11/26/23 50.6 kg  11/16/23 47.2 kg  11/15/23 48.5 kg     Intake/Output Summary (Last 24 hours) at 11/27/2023 0818 Last data filed at 11/27/2023 0640 Gross per 24 hour  Intake 800  ml  Output 700 ml  Net 100 ml    Exam  Awake Alert, No new F.N deficits, Normal affect, trach site and PEG tube site stable, [place].AT,PERRAL Supple Neck, No JVD,   Symmetrical Chest wall movement, Good air movement bilaterally, CTAB RRR,No Gallops, Rubs or new Murmurs,  +ve B.Sounds, Abd Soft, No tenderness,   No Cyanosis, Clubbing or edema   Data Review

## 2023-11-28 NOTE — Care Management (Signed)
 POST DISCHARGE NOTE 11/28/2023 07:40  Obtained signature from Dr Dennise on PDN order received via email from Lamar Moellers with [facility]. Scanned order for PDN through [facility] to Robert Medlin as well as clinicals requested.

## 2023-11-30 ENCOUNTER — Other Ambulatory Visit (INDEPENDENT_AMBULATORY_CARE_PROVIDER_SITE_OTHER): Payer: Self-pay

## 2023-11-30 DIAGNOSIS — C109 Malignant neoplasm of oropharynx, unspecified: Secondary | ICD-10-CM

## 2023-12-01 ENCOUNTER — Other Ambulatory Visit: Payer: Self-pay

## 2023-12-01 DIAGNOSIS — C109 Malignant neoplasm of oropharynx, unspecified: Secondary | ICD-10-CM

## 2023-12-01 NOTE — Progress Notes (Signed)
 Oncology Nurse Navigator Documentation   I received communication from Dr. Tobie with ENT that Mr. Kirkey had been discharged from the hospital and was ready to have his PET and referrals placed to medical and radiation oncology. I called and spoke with Mr. Dragone to explain that he would be receiving calls to get the above appointments scheduled. Mr. Lawrance informed me that he would prefer his treatment in [place] and [place]. I will place referrals to both locations and I have notified Dr. Tobie of Mr. Piacentini wishes. I did provide Mr. Danford with my direct number if he had any need for assistance in the future.   Delon Jefferson RN, BSN, OCN Head & Neck Oncology Nurse Navigator [facility] Phone # [phone]  Fax # [phone]

## 2023-12-03 ENCOUNTER — Encounter (HOSPITAL_COMMUNITY)
Admission: RE | Admit: 2023-12-03 | Discharge: 2023-12-03 | Disposition: A | Discharge status: Home / Self Care | Source: Ambulatory Visit | Attending: Otolaryngology | Admitting: Otolaryngology

## 2023-12-03 DIAGNOSIS — C109 Malignant neoplasm of oropharynx, unspecified: Secondary | ICD-10-CM | POA: Diagnosis present

## 2023-12-03 MED ORDER — FLUDEOXYGLUCOSE F - 18 (FDG) INJECTION
5.8800 | Freq: Once | INTRAVENOUS | Status: AC | PRN
  Administered 2023-12-03: 5.88 via INTRAVENOUS

## 2023-12-08 ENCOUNTER — Ambulatory Visit (INDEPENDENT_AMBULATORY_CARE_PROVIDER_SITE_OTHER): Admitting: Otolaryngology

## 2023-12-08 ENCOUNTER — Encounter (INDEPENDENT_AMBULATORY_CARE_PROVIDER_SITE_OTHER): Payer: Self-pay | Admitting: Otolaryngology

## 2023-12-08 VITALS — BP 124/81 | HR 93 | Ht 71.0 in | Wt 111.0 lb

## 2023-12-08 DIAGNOSIS — J392 Other diseases of pharynx: Secondary | ICD-10-CM | POA: Diagnosis not present

## 2023-12-08 DIAGNOSIS — F17200 Nicotine dependence, unspecified, uncomplicated: Secondary | ICD-10-CM | POA: Diagnosis not present

## 2023-12-08 DIAGNOSIS — C109 Malignant neoplasm of oropharynx, unspecified: Secondary | ICD-10-CM

## 2023-12-08 DIAGNOSIS — Z930 Tracheostomy status: Secondary | ICD-10-CM

## 2023-12-08 MED ORDER — HYDROXYZINE PAMOATE 25 MG PO CAPS: 25.0000 mg | ORAL_CAPSULE | Freq: Every evening | ORAL | 0 refills | Status: AC | PRN

## 2023-12-08 NOTE — Progress Notes (Signed)
 Dear Dr. Benjamin, Here is my assessment for our mutual patient, Jerome Washington. Thank you for allowing me the opportunity to care for your patient. Please do not hesitate to contact me should you have any other questions. Sincerely, Dr. Eldora Blanch  Otolaryngology Clinic Note Referring provider: Dr. Benjamin HPI:  Jerome Washington is a 53 y.o. male kindly referred by Dr. Benjamin for evaluation of oropharynx mass  Initial visit (10/2023): H/o oral cavity SCCa s/p resection and then now having throat pain for past month. Seen in ED, dx with ear infection but not improved. Having hard time swallowing but lays flat fairly well. Lots of mucus. Did have h/o trach Does smoke, drinks fair amount of alcohol  --------------------------------------------------------- 12/08/2023 Since last visit, he underwent urgent DL/Bx and tracheostomy. Noted to have large OP mass with SCCa. Discussed case at TB, who recommended CRT which patient wishes for instead of surgery. Since surgery, he is now in process of going through CRT. Has upcoming appt with med/onc tomorrow. He notes no issues with tracheostomy or accidental decannulations. He does have a backup.  Personal or FHx of bleeding dz or anesthesia difficulty: no  AP/AC: no  Tobacco: yes  PMHx: MI, HTN  Independent Review of Additional Tests or Records:  ED notes 11/15/2023 Jerome Washington - noted h/o SCCa mouth with throat pain for past week; difficulty swallowing, no fever/chills. Ct done, concerning for carcinoma; Dx: OP carcinoma;a Rx: ref to ENT, no signs of airway compromise. CBC and CMP 11/15/2023: WBC 11.4, Plt 379; BUN/Cr 8/0.47 CT Neck with contrast independently interpreted (11/15/2023) - agree with read - noted posterior oropharynx mass with left parotid cyst; also noted to have right posterolateral retropharyngeal lymph node, likely metastatic disease; no other noted necrotic neck LN. Extends down into hypopharynx/another mass in  hypopharynx? Jerome Washington (06/06/2019): noted FOM SCCa s/p resection and NF and RFFF; ranula excision done left side, flap recon(?)[id number] Path 11/09/2018: pT2N0 SCC of the ventral tongue (1.4 cm w/ 7 mm DOI), no LVI or PNI, margins negative to 2 mm, 0/58 LN involved  PET/CT 12/03/2023: PET avid oropharynx mass with separate hypopharynx component as well on right; no noted adenopathy in lower neck but large retropharyngeal lymph node. PMH/Meds/All/SocHx/FamHx/ROS:   Past Medical History:  Diagnosis Date   Cancer (HCC)    mouth   Chronic back pain    Hypertension    Myocardial infarction (HCC)    Sclerosis, hereditary spinal (HCC)      Past Surgical History:  Procedure Laterality Date   IR GASTROSTOMY TUBE MOD SED  11/19/2023   MICROLARYNGOSCOPY WITH LASER N/A 11/17/2023   Procedure: DIRECT LARYNOSCOPY WITH BIOPSY;  Surgeon: Blanch Eldora NOVAK, MD;  Location: [facility] OR;  Service: ENT;  Laterality: N/A;   NECK SURGERY     mouth cancer   TRACHEOSTOMY     TRACHEOSTOMY TUBE PLACEMENT N/A 11/17/2023   Procedure: CREATION, TRACHEOSTOMY;  Surgeon: Blanch Eldora NOVAK, MD;  Location: [facility] OR;  Service: ENT;  Laterality: N/A;  AWAKE Tracheostomy v/s Fiberoptic intubation, direct laryngoscopy with Biopsy    History reviewed. No pertinent family history.   Social Connections: Not on file      Current Outpatient Medications:    acetaminophen  (TYLENOL ) 500 MG tablet, Crush and place 1 tablet (500 mg total) into feeding tube every 8 (eight) hours as needed as directed., Disp: 20 tablet, Rfl: 0   albuterol  (PROVENTIL ) (2.5 MG/3ML) 0.083% nebulizer solution, Take 3 mLs (2.5 mg total) by nebulization every 2 (two) hours  as needed for wheezing or shortness of breath., Disp: 90 mL, Rfl: 12   docusate (COLACE) 50 MG/5ML liquid, Place 10 mLs (100 mg total) into feeding tube 2 (two) times daily as needed for mild constipation., Disp: 100 mL, Rfl: 0   fluticasone (FLONASE) 50 MCG/ACT nasal spray, Place 1 spray  into both nostrils 2 (two) times daily as needed for allergies., Disp: , Rfl:    hydrOXYzine  (VISTARIL ) 25 MG capsule, Take 1 capsule (25 mg total) by mouth at bedtime as needed for anxiety., Disp: 30 capsule, Rfl: 0   Multiple Vitamin (MULTIVITAMIN WITH MINERALS) TABS tablet, Crush and place 1 tablet into feeding tube daily as directed., Disp: 30 tablet, Rfl: 0   Nutritional Supplements (FEEDING SUPPLEMENT, OSMOLITE 1.5 CAL,) LIQD, Place 1,000 mLs into feeding tube continuous. 35cc/hr, Disp: , Rfl:    polyethylene glycol powder (GLYCOLAX /MIRALAX ) 17 GM/SCOOP powder, Take 17 g by mouth daily as needed for moderate constipation. Dissolve 1 capful (17g) in 4-8 ounces of liquid and take by mouth daily., Disp: 238 g, Rfl: 0   predniSONE  (DELTASONE ) 20 MG tablet, TAKE 1 TABLET BY MOUTH ONCE DAILY WITH BREAKFAST FOR 5 DAYS, Disp: , Rfl:    Protein (FEEDING SUPPLEMENT, PROSOURCE TF20,) liquid, Place 60 mLs into feeding tube 2 (two) times daily., Disp: 60 mL, Rfl: 5   traZODone (DESYREL) 50 MG tablet, Take 50 mg by mouth daily., Disp: , Rfl:    Water  For Irrigation, Sterile (FREE WATER ) SOLN, Place 200 mLs into feeding tube every 8 (eight) hours., Disp: , Rfl:    Physical Exam:   BP 124/81 (BP Location: Left Arm, Patient Position: Sitting, Cuff Size: Normal)   Pulse 93   Ht 5' 11 (1.803 m)   Wt 111 lb (50.3 kg)   SpO2 95%   BMI 15.48 kg/m   Salient findings:  CN II-XII intact Anterior rhinoscopy: Septum intact; bilateral inferior turbinates without significant hypertrophy No lesions of oral cavity - prior RFFF well healed, fairly good tongue mobility; OP with large mass, hyponasal voice No obviously palpable neck masses/lymphadenopathy; 4 cuffless trach in place, good voice; PMV in place No respiratory distress or stridor currently Seprately Identifiable Procedures:  Prior to initiating any procedures, risks/benefits/alternatives were explained to the patient and verbal consent  obtained. Procedure Note (Prior, not today) Pre-procedure diagnosis:  oropharynx mass Post-procedure diagnosis: Same Procedure: Transnasal Fiberoptic Laryngoscopy, CPT 31575 - Mod 25 Indication: see above Complications: None apparent EBL: 0 mL  The procedure was undertaken to further evaluate the patient's complaint above, with mirror exam inadequate for appropriate examination due to gag reflex and poor patient tolerance  Procedure:  Patient was identified as correct patient. Verbal consent was obtained. The nose was sprayed with oxymetazoline  and 4% lidocaine . The The flexible laryngoscope was passed through the nose to view the nasal cavity, pharynx (oropharynx, hypopharynx) and larynx.  The larynx was examined at rest and during multiple phonatory tasks. Documentation was obtained and reviewed with patient. The scope was removed. The patient tolerated the procedure well.  Findings: The nasal cavity and nasopharynx did not reveal any masses or lesions, mucosa appeared to be without obvious lesions. The tongue base, pharyngeal walls, piriform sinuses, vallecula, epiglottis and postcricoid region are normal in appearance EXCEPT: large b/l oropharynx mass with significant amount of secretions; epiglottis, AE fold, hypopharynx and VF appear without pathology -- able to sneak past with the camera. The visualized portion of the subglottis and proximal trachea is widely patent. The vocal folds are  mobile bilaterally. There are no lesions on the free edge of the vocal folds nor elsewhere in the larynx worrisome for malignancy.    Fiberoptic intubation should be feasible v/s awake tracheostomy Electronically signed by: [name], MD 12/08/2023 4:00 PM   Impression & Plans:  Jerome Washington is a 53 y.o. male with:  1. Oropharyngeal cancer (HCC)   2. Mass of oropharynx   3. Tobacco use disorder   4. Status post tracheostomy (HCC)    Likely 2nd primary. Large OP mass with RP lymph node and  likely small skip lesion on right hypopharynx as well.  Now s/p tracheostomy. Case dicussed at tumor board and with patient: options likely are oropharyngectomy with free flap and possible laryngectomy with post-CRT v/s CRT. He opts for CRT. Will Rx Atarax  for anxiety.  Continue G-tube feeds; will see him 3 months post treatment ~Late May. Discussed aggressive nature of this tumor.  He has stopped using tobacco which I congratulated him about  Phone: [phone]  See below regarding exact medications prescribed this encounter including dosages and route: Meds ordered this encounter  Medications   hydrOXYzine  (VISTARIL ) 25 MG capsule    Sig: Take 1 capsule (25 mg total) by mouth at bedtime as needed for anxiety.    Dispense:  30 capsule    Refill:  0      Thank you for allowing me the opportunity to care for your patient. Please do not hesitate to contact me should you have any other questions.  Sincerely, Eldora Blanch, MD Otolaryngologist (ENT), [facility] Phone: [phone] Fax: [phone]  12/08/2023, 4:00 PM   I have personally spent 40 minutes involved in face-to-face and non-face-to-face activities for this patient on the day of the visit.  Professional time spent excludes any procedures performed but includes the following activities, in addition to those noted in the documentation: preparing to see the patient (review of outside documentation and results), performing a medically appropriate examination, counseling, documenting in the electronic health record, independently interpreting results (PET), Care coordination

## 2023-12-09 ENCOUNTER — Encounter: Payer: Self-pay | Admitting: Oncology

## 2023-12-09 ENCOUNTER — Inpatient Hospital Stay

## 2023-12-09 ENCOUNTER — Inpatient Hospital Stay: Attending: Oncology | Admitting: Oncology

## 2023-12-09 VITALS — BP 120/76 | HR 84 | Temp 98.3°F | Resp 18 | Ht 72.0 in | Wt 115.0 lb

## 2023-12-09 DIAGNOSIS — C109 Malignant neoplasm of oropharynx, unspecified: Secondary | ICD-10-CM | POA: Diagnosis present

## 2023-12-09 DIAGNOSIS — R1310 Dysphagia, unspecified: Secondary | ICD-10-CM | POA: Diagnosis not present

## 2023-12-09 DIAGNOSIS — R634 Abnormal weight loss: Secondary | ICD-10-CM | POA: Insufficient documentation

## 2023-12-09 DIAGNOSIS — D649 Anemia, unspecified: Secondary | ICD-10-CM | POA: Insufficient documentation

## 2023-12-09 DIAGNOSIS — G4709 Other insomnia: Secondary | ICD-10-CM | POA: Insufficient documentation

## 2023-12-09 DIAGNOSIS — R519 Headache, unspecified: Secondary | ICD-10-CM | POA: Diagnosis not present

## 2023-12-09 LAB — COMPREHENSIVE METABOLIC PANEL WITH GFR
ALT: 9 U/L (ref 0–44)
AST: 16 U/L (ref 15–41)
Albumin: 3.8 g/dL (ref 3.5–5.0)
Alkaline Phosphatase: 102 U/L (ref 38–126)
Anion gap: 9 (ref 5–15)
BUN: 9 mg/dL (ref 6–20)
CO2: 30 mmol/L (ref 22–32)
Calcium: 9.6 mg/dL (ref 8.9–10.3)
Chloride: 97 mmol/L — ABNORMAL LOW (ref 98–111)
Creatinine, Ser: 0.44 mg/dL — ABNORMAL LOW (ref 0.61–1.24)
GFR, Estimated: >60 mL/min (ref >60)
Glucose, Bld: 88 mg/dL (ref 70–99)
Potassium: 4.4 mmol/L (ref 3.5–5.1)
Sodium: 135 mmol/L (ref 135–145)
Total Bilirubin: 0.3 mg/dL (ref 0.0–1.2)
Total Protein: 7.1 g/dL (ref 6.5–8.1)

## 2023-12-09 LAB — CBC WITH DIFFERENTIAL/PLATELET
Abs Immature Granulocytes: 0.03 K/uL (ref 0.00–0.07)
Basophils Absolute: 0.1 K/uL (ref 0.0–0.1)
Basophils Relative: 1 %
Eosinophils Absolute: 0.1 K/uL (ref 0.0–0.5)
Eosinophils Relative: 1 %
HCT: 34.1 % — ABNORMAL LOW (ref 39.0–52.0)
Hemoglobin: 10.9 g/dL — ABNORMAL LOW (ref 13.0–17.0)
Immature Granulocytes: 0 %
Lymphocytes Relative: 20 %
Lymphs Abs: 1.7 K/uL (ref 0.7–4.0)
MCH: 32.6 pg (ref 26.0–34.0)
MCHC: 32 g/dL (ref 30.0–36.0)
MCV: 102.1 fL — ABNORMAL HIGH (ref 80.0–100.0)
Monocytes Absolute: 1.1 K/uL — ABNORMAL HIGH (ref 0.1–1.0)
Monocytes Relative: 14 %
Neutro Abs: 5.3 K/uL (ref 1.7–7.7)
Neutrophils Relative %: 64 %
Platelets: 401 K/uL — ABNORMAL HIGH (ref 150–400)
RBC: 3.34 MIL/uL — ABNORMAL LOW (ref 4.22–5.81)
RDW: 11.9 % (ref 11.5–15.5)
WBC: 8.3 K/uL (ref 4.0–10.5)
nRBC: 0 % (ref 0.0–0.2)

## 2023-12-09 LAB — FERRITIN: Ferritin: 147 ng/mL (ref 24–336)

## 2023-12-09 LAB — VITAMIN B12: Vitamin B-12: 643 pg/mL (ref 180–914)

## 2023-12-09 LAB — IRON AND TIBC
Iron: 19 ug/dL — ABNORMAL LOW (ref 45–182)
Saturation Ratios: 6 % — ABNORMAL LOW (ref 17.9–39.5)
TIBC: 305 ug/dL (ref 250–450)
UIBC: 286 ug/dL

## 2023-12-09 LAB — FOLATE: Folate: 19.5 ng/mL (ref >5.9)

## 2023-12-09 NOTE — Progress Notes (Signed)
 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 Summary of oncologic history is as follows:  Diagnosis: Recurrent oropharyngeal carcinoma  -10/22/2018: Floor of mouth, biopsy: Invasive squamous cell carcinoma, well-differentiated, keratinizing type.  The tumor has a thickness of at least 4 mm and involves the specimen base. 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  All surgical margins are negative for tumor or high-grade dysplasia.  No lymph node involvement.  Rest of the areas negative for carcinoma. -11/17/2023: Oropharynx excision: -Squamous cell carcinoma, moderately differentiated.  - Immunohistochemical stain for p16 is negative  -12/03/2023: PET scan: Large hypermetabolic mass involving the posterior oropharynx with metastatic disease involving the right posterolateral retropharyngeal space consistent with recurrent malignancy. No suspicious finding to suggest new distant metastasis within the abdomen and pelvis.   History of Presenting Illness: Discussed the use of AI scribe software for clinical note transcription with the patient, who gave verbal consent to proceed.  History of Present Illness Jerome Washington is a 53 year old male with recurrent oropharyngeal cancer who presents to establish care.  He has a history of oropharyngeal cancer, initially treated with surgery in 2020, and is now experiencing a recurrence. He presented to the emergency room with new-onset difficulty breathing, which led to the placement of a feeding tube and tracheostomy approximately three weeks ago. Since the placement of the feeding tube, he has gained some weight.  He  experienced difficulty swallowing and significant weight loss prior to the feeding tube placement, unable to eat or swallow for about two to three months. This led to worsening shortness of breath and inability to sleep at night. He describes ongoing headaches and difficulty sleeping, stating 'I haven't slept for like half a month, it hurts.'  He quit smoking three weeks ago. He also reports occasional alcohol consumption, about one to two beers per week. His appetite is limited, and he relies on the feeding tube for nutrition, noting difficulty with solid foods like meat and bread, but can consume ice cream and jello.  He has a history of psoriasis, which he does not currently treat, and mentions having it on his skin and forehead. He denies any other medical conditions or regular medications aside from the recent use of ibuprofen  for headaches. He is awaiting further nutritional support as he is running low on his current supply of feeding formula.    Medical History: Past Medical History:  Diagnosis Date   Cancer (HCC)    mouth   Chronic back pain    Hypertension    Myocardial infarction (HCC)    Sclerosis, hereditary spinal (HCC)     Surgical history: Past Surgical History:  Procedure Laterality Date   IR GASTROSTOMY TUBE MOD SED  11/19/2023   MICROLARYNGOSCOPY WITH LASER N/A 11/17/2023   Procedure: DIRECT LARYNOSCOPY WITH BIOPSY;  Surgeon: Tobie Eldora NOVAK, MD;  Location: [facility] OR;  Service: ENT;  Laterality: N/A;   NECK SURGERY     mouth cancer   TRACHEOSTOMY     TRACHEOSTOMY TUBE PLACEMENT N/A 11/17/2023   Procedure: CREATION, TRACHEOSTOMY;  Surgeon: Tobie Eldora NOVAK, MD;  Location: [facility] OR;  Service: ENT;  Laterality: N/A;  AWAKE Tracheostomy v/s Fiberoptic intubation, direct laryngoscopy with Biopsy     Allergies:  is allergic to  bee venom, morphine  and codeine , and penicillins.  Medications:  Current Outpatient Medications  Medication Sig Dispense Refill   acetaminophen   (TYLENOL ) 500 MG tablet Crush and place 1 tablet (500 mg total) into feeding tube every 8 (eight) hours as needed as directed. 20 tablet 0   albuterol  (PROVENTIL ) (2.5 MG/3ML) 0.083% nebulizer solution Take 3 mLs (2.5 mg total) by nebulization every 2 (two) hours as needed for wheezing or shortness of breath. 90 mL 12   hydrOXYzine  (VISTARIL ) 25 MG capsule Take 1 capsule (25 mg total) by mouth at bedtime as needed for anxiety. 30 capsule 0   Multiple Vitamin (MULTIVITAMIN WITH MINERALS) TABS tablet Crush and place 1 tablet into feeding tube daily as directed. 30 tablet 0   Nutritional Supplements (FEEDING SUPPLEMENT, OSMOLITE 1.5 CAL,) LIQD Place 1,000 mLs into feeding tube continuous. 35cc/hr     predniSONE  (DELTASONE ) 20 MG tablet TAKE 1 TABLET BY MOUTH ONCE DAILY WITH BREAKFAST FOR 5 DAYS     traZODone (DESYREL) 50 MG tablet Take 50 mg by mouth daily.     Water  For Irrigation, Sterile (FREE WATER ) SOLN Place 200 mLs into feeding tube every 8 (eight) hours.     docusate (COLACE) 50 MG/5ML liquid Place 10 mLs (100 mg total) into feeding tube 2 (two) times daily as needed for mild constipation. 100 mL 0   ferrous sulfate 324 MG TBEC Take 324 mg by mouth.     polyethylene glycol powder (GLYCOLAX /MIRALAX ) 17 GM/SCOOP powder Take 17 g by mouth daily as needed for moderate constipation. Dissolve 1 capful (17g) in 4-8 ounces of liquid and take by mouth daily. (Patient not taking: Reported on 12/09/2023) 238 g 0   Protein (FEEDING SUPPLEMENT, PROSOURCE TF20,) liquid Place 60 mLs into feeding tube 2 (two) times daily. (Patient not taking: Reported on 12/09/2023) 60 mL 5   No current facility-administered medications for this visit.   Facility-Administered Medications Ordered in Other Visits  Medication Dose Route Frequency Provider Last Rate Last Admin   0.9 %  sodium chloride  infusion   Intravenous Continuous McInnis, Caitlyn M, PA-C       vancomycin  (VANCOCIN ) IVPB 1000 mg/200 mL premix  1,000 mg  Intravenous Once Mugweru, Jon, MD 200 mL/hr at 12/11/23 1352 1,000 mg at 12/11/23 1352    Review of Systems: Constitutional: Denies fevers, chills or abnormal night sweats Eyes: Denies blurriness of vision, double vision or watery eyes Ears, nose, mouth, throat, and face: Denies mucositis or sore throat Respiratory: Denies cough, dyspnea or wheezes Cardiovascular: Denies palpitation, chest discomfort or lower extremity swelling Gastrointestinal:  Denies nausea, heartburn or change in bowel habits Skin: Denies abnormal skin rashes Lymphatics: Denies new lymphadenopathy or easy bruising Neurological:Denies numbness, tingling or new weaknesses Behavioral/Psych: Mood is stable, no new changes   All other systems were reviewed with the patient and are negative.  Physical Examination: ECOG PERFORMANCE STATUS: 1 - Symptomatic but completely ambulatory  Vitals:   12/09/23 1341  BP: 120/76  Pulse: 84  Resp: 18  Temp: 98.3 F (36.8 C)  SpO2: 100%   Filed Weights   12/09/23 1341  Weight: 115 lb (52.2 kg)    GENERAL:alert, no distress and comfortable, with a tracheostomy tube SKIN: Psoriatic rash on forehead, elbows and legs OROPHARYNX: Large oropharyngeal mass palpated.  No lymphadenopathy palpated. LYMPH:  no palpable lymphadenopathy in the cervical, axillary or inguinal LUNGS: clear to auscultation and percussion with normal breathing effort HEART: regular rate & rhythm and no murmurs and no lower  extremity edema ABDOMEN:abdomen soft, non-tender and normal bowel sounds Musculoskeletal:no cyanosis of digits and no clubbing  PSYCH: alert & oriented x 3 with fluent speech   Laboratory Data: I have reviewed the data as listed Lab Results  Component Value Date   WBC 8.3 12/09/2023   HGB 10.9 (L) 12/09/2023   HCT 34.1 (L) 12/09/2023   MCV 102.1 (H) 12/09/2023   PLT 401 (H) 12/09/2023   Recent Labs    11/15/23 1111 11/19/23 0214 11/20/23 0315 11/25/23 0257 11/26/23 0306  12/09/23 1416  NA 135 132*   < > 128* 133* 135  K 4.3 3.8   < > 3.4* 3.8 4.4  CL 92* 93*   < > 88* 95* 97*  CO2 34* 30   < > 30 27 30   GLUCOSE 85 110*   < > 89 93 88  BUN 8 6   < > 10 <5* 9  CREATININE 0.47* 0.49*   < > 0.51* 0.49* 0.44*  CALCIUM 10.3 9.3   < > 9.2 8.8* 9.6  GFRNONAA >60 >60   < > >60 >60 >60  PROT 7.4 5.8*  --   --   --  7.1  ALBUMIN 4.0 2.6*  --   --   --  3.8  AST 22 17  --   --   --  16  ALT 6 8  --   --   --  9  ALKPHOS 121 57  --   --   --  102  BILITOT 0.5 0.9  --   --   --  0.3   < > = values in this interval not displayed.    Radiographic Studies: I have personally reviewed the radiological images as listed and agreed with the findings in the report.  NM PET Image Initial (PI) Skull Base To Thigh (F-18 FDG) CLINICAL DATA:  Initial treatment strategy for oropharyngeal cancer.  EXAM: NUCLEAR MEDICINE PET SKULL BASE TO THIGH  TECHNIQUE: 5.88 mCi F-18 FDG was injected intravenously. Full-ring PET imaging was performed from the skull base to thigh after the radiotracer. CT data was obtained and used for attenuation correction and anatomic localization.  Fasting blood glucose: 118 mg/dl  COMPARISON:  CT neck November 15, 2023, CT abdomen pelvis November 18, 2023.  FINDINGS: Mediastinal blood pool activity: SUV max 2.1  Liver activity: SUV max 2.4  NECK: There is a large hypermetabolic mass involving the bilateral posterior oropharynx (crossing midline) measuring approximately 7.5 x 3 cm with max SUV 17.4 consistent with known malignancy, previously measures 6.3 x 3.2 cm on CT. Patient is status post partial glossectomy and bilateral neck dissection.  Additional hypermetabolic lesion in right posterolateral retropharyngeal/supraglottic measuring 3.5 x 2.4 cm with max SUV 11.1, likely metastatic disease, previously measured 2.3 x 1.5 cm.  Subcutaneous left parotid gland photopenic cystic lesion is stable to prior measuring 2.7 x 1.8 cm likely  benign.  Subcutaneous left cheek photopenic lesion measuring 1.6 x 1.2 cm likely benign, stable to prior.  Tracheostomy tube in place in proper position. Left maxillary sinus mucous retention cyst.  CHEST: Subcentimeter nodule in posteromedial right lower lobe measuring 13 mm with a solid component measuring 6 mm (204/41). Biapical pleuroparenchymal scarring. Left lower lobe pulmonary micronodule (204/37). below PET resolution.  Incidental CT findings: Atherosclerotic calcifications of coronary arteries.  ABDOMEN/PELVIS: No suspicious findings to suggest metastatic disease. Cystic photopenic structure in right scrotal sac likely benign, likely epididymal cyst.  Incidental CT findings: Gastrostomy tube in  place.  SKELETON: No suspicious osseous lesion. Multilevel degenerative changes of the spine.  Incidental CT findings: None.  IMPRESSION: Large hypermetabolic mass involving the posterior oropharynx with metastatic disease involving the right posterolateral retropharyngeal space consistent with recurrent malignancy. Recommend further assessment with dedicated CT or MRI with contrast.  No suspicious finding to suggest new distant metastasis within the abdomen and pelvis.  Benign photopenic cystic lesions involving the left parotid gland and left cheek.  Electronically Signed   By: Megan  Zare M.D.   On: 12/04/2023 15:33    ASSESSMENT & PLAN:  Patient is a 53 y.o. male presenting for recurrent oropharyngeal carcinoma  Assessment and Plan Assessment & Plan Recurrent oropharyngeal cancer  Recurrent oropharyngeal cancer with probable lymph node involvement, P16 negative. Likely T4N1M0 disease Discussed by ENT at [facility] and recommendation for oropharyngectomy with free flap and possible laryngectomy with post-CRT v/s CRT. Patient chose chemo RT.  - Patient has good renal function.  Can consider doing concurrent chemoRT with weekly cisplatin. - Patient is already referred  to radiation oncologist Dr. Dr. Dannielle at [facility].  Will coordinate care with him. -Will order mutation Caris NGS on the tissue for mutation analysis -Discussed risk versus benefits of port for chemotherapy administration.  Also discussed most common side effects including bleeding, infection, thrombosis.  Patient agreed to proceed with it.  Will place an IR consult. - Briefly discussed chemotherapy side effects including renal dysfunction, tinnitus, myelosuppression, nausea, vomiting.  Will rediscuss elaborately during chemo education - Will schedule for chemo education  Return to clinic with second cycle of chemotherapy  Dysphagia and weight loss secondary to oropharyngeal cancer Dysphagia and weight loss improved with PEG tube placement.  - Continue nutritional support via PEG tube. - Consulted with nutritionist for dietary management.  Insomnia and headache Persistent insomnia and headaches, possibly cancer or treatment-related. Ibuprofen  suggested for headache relief. - Try ibuprofen  400 mg for headache relief. - Will order MRI of the brain to rule out other causes of headache.  Normocytic anemia Anemia requires further evaluation to determine etiology.  - Ordered CBC and nutrition studies to evaluate anemia.  Will replace as needed.  Tobacco use Patient is a former smoker and quit smoking recently.  -Encouraged abstinence from smoking.    Orders Placed This Encounter  Procedures   IR IMAGING GUIDED PORT INSERTION    Standing Status:   Future    Number of Occurrences:   1    Expected Date:   12/10/2023    Expiration Date:   12/08/2024    Reason for Exam (SYMPTOM  OR DIAGNOSIS REQUIRED):   chemotherapy administration    Preferred Imaging Location?:   [facility]    Release to patient:   Immediate   MR Brain W Wo Contrast    Standing Status:   Future    Expected Date:   12/16/2023    Expiration Date:   12/08/2024    If indicated for the ordered procedure, I  authorize the administration of contrast media per Radiology protocol:   Yes    What is the patient's sedation requirement?:   No Sedation    Does the patient have a pacemaker or implanted devices?:   No    Use SRS Protocol?:   No    Preferred imaging location?:   [facility] (table limit - 500lbs)    Release to patient:   Immediate   CBC with Differential    Standing Status:   Future  Number of Occurrences:   1    Expected Date:   12/09/2023    Expiration Date:   03/08/2024   Comprehensive metabolic panel    Standing Status:   Future    Number of Occurrences:   1    Expected Date:   12/09/2023    Expiration Date:   03/08/2024   Iron and TIBC ([facility] ONLY)    Standing Status:   Future    Number of Occurrences:   1    Expected Date:   12/09/2023    Expiration Date:   03/08/2024   Ferritin    Standing Status:   Future    Number of Occurrences:   1    Expected Date:   12/09/2023    Expiration Date:   03/08/2024   Vitamin B12    Standing Status:   Future    Number of Occurrences:   1    Expected Date:   12/09/2023    Expiration Date:   03/08/2024   Folate    Standing Status:   Future    Number of Occurrences:   1    Expected Date:   12/09/2023    Expiration Date:   03/08/2024   Ambulatory Referral to [facility] Nutrition    Referral Priority:   Routine    Referral Type:   Consultation    Referral Reason:   Specialty Services Required    Number of Visits Requested:   1   Ambulatory referral to Social Work    Referral Priority:   Routine    Referral Type:   Consultation    Referral Reason:   Specialty Services Required    Number of Visits Requested:   1    The total time spent in the appointment was 60 minutes encounter with patients including review of chart and various tests results, discussions about plan of care and coordination of care plan   All questions were answered. The patient knows to call the clinic with any problems, questions or concerns.  No barriers to learning was detected.  Mickiel Dry, MD 11/21/20251:55 PM

## 2023-12-09 NOTE — Patient Instructions (Addendum)
 [facility]  Discharge Instructions  You were seen and examined today by Dr. Davonna. Dr. Davonna is a medical oncologist, meaning that she specializes in the treatment of cancer diagnoses. Dr. Davonna discussed your past medical history, family history of cancers, and the events that led to you being here today.  You were referred to Dr. Davonna due to a new recurrence of head and neck cancer. The recommended course of treatment is concurrent chemotherapy and radiation.  See Dr. Dannielle as scheduled.  In order to safely administer chemotherapy, we will need to place a Port-A-Cath.  Dr. Davonna has ordered a brain MRI due to your headaches.  Dr. Davonna has also recommended labs today.  Follow-up as scheduled.  Thank you for choosing [facility] to provide your oncology and hematology care.   To afford each patient quality time with our provider, please arrive at least 15 minutes before your scheduled appointment time. You may need to reschedule your appointment if you arrive late (10 or more minutes). Arriving late affects you and other patients whose appointments are after yours.  Also, if you miss three or more appointments without notifying the office, you may be dismissed from the clinic at the provider's discretion.    Again, thank you for choosing [facility].  Our hope is that these requests will decrease the amount of time that you wait before being seen by our physicians.   If you have a lab appointment with the Cancer Center - please note that after April 8th, all labs will be drawn in the cancer center.  You do not have to check in or register with the main entrance as you have in the past but will complete your check-in at the cancer center.            _____________________________________________________________  Should you have questions after your visit to [facility], please contact our office  at [phone] and follow the prompts.  Our office hours are 8:00 a.m. to 4:30 p.m. Monday - Thursday and 8:00 a.m. to 2:30 p.m. Friday.  Please note that voicemails left after 4:00 p.m. may not be returned until the following business day.  We are closed weekends and all major holidays.  You do have access to a nurse 24-7, just call the main number to the clinic [phone] and do not press any options, hold on the line and a nurse will answer the phone.    For prescription refill requests, have your pharmacy contact our office and allow 72 hours.    Masks are no longer required in the cancer centers. If you would like for your care team to wear a mask while they are taking care of you, please let them know. You may have one support person who is at least 51 years old accompany you for your appointments.

## 2023-12-10 ENCOUNTER — Ambulatory Visit: Payer: Self-pay | Admitting: *Deleted

## 2023-12-10 ENCOUNTER — Other Ambulatory Visit: Payer: Self-pay | Admitting: *Deleted

## 2023-12-10 ENCOUNTER — Other Ambulatory Visit: Payer: Self-pay | Admitting: Radiology

## 2023-12-10 NOTE — Progress Notes (Signed)
 Patient is unable to swallow at this time, therefore will need to hold off of P.O. iron supplement.  Advised that he is deficient and will require 2 iron infusions.  Verbalized understanding.

## 2023-12-10 NOTE — Telephone Encounter (Signed)
 Patient has not received his feeding tube bags from company as ordered.  Has supplements but no bags.  Information provided for [employer], as they would likely have what he needs until his shipment arrives.

## 2023-12-11 ENCOUNTER — Other Ambulatory Visit: Payer: Self-pay

## 2023-12-11 ENCOUNTER — Ambulatory Visit (HOSPITAL_COMMUNITY)
Admission: RE | Admit: 2023-12-11 | Discharge: 2023-12-11 | Disposition: A | Discharge status: Home / Self Care | Source: Ambulatory Visit | Attending: Oncology | Admitting: Oncology

## 2023-12-11 DIAGNOSIS — K219 Gastro-esophageal reflux disease without esophagitis: Secondary | ICD-10-CM | POA: Diagnosis not present

## 2023-12-11 DIAGNOSIS — I252 Old myocardial infarction: Secondary | ICD-10-CM | POA: Insufficient documentation

## 2023-12-11 DIAGNOSIS — C109 Malignant neoplasm of oropharynx, unspecified: Secondary | ICD-10-CM | POA: Diagnosis present

## 2023-12-11 DIAGNOSIS — F17210 Nicotine dependence, cigarettes, uncomplicated: Secondary | ICD-10-CM | POA: Diagnosis not present

## 2023-12-11 DIAGNOSIS — I10 Essential (primary) hypertension: Secondary | ICD-10-CM | POA: Diagnosis not present

## 2023-12-11 HISTORY — PX: IR IMAGING GUIDED PORT INSERTION: IMG5740

## 2023-12-11 MED ORDER — VANCOMYCIN HCL IN DEXTROSE 1-5 GM/200ML-% IV SOLN
1000.0000 mg | Freq: Once | INTRAVENOUS | Status: AC
  Administered 2023-12-11: 1000 mg via INTRAVENOUS

## 2023-12-11 MED ORDER — LIDOCAINE-EPINEPHRINE 1 %-1:100000 IJ SOLN
20.0000 mL | Freq: Once | INTRAMUSCULAR | Status: AC
  Administered 2023-12-11: 20 mL via INTRADERMAL

## 2023-12-11 MED ORDER — FENTANYL CITRATE (PF) 100 MCG/2ML IJ SOLN
INTRAMUSCULAR | Status: AC
  Filled 2023-12-11: qty 2

## 2023-12-11 MED ORDER — SODIUM CHLORIDE 0.9 % IV SOLN: INTRAVENOUS | Status: DC

## 2023-12-11 MED ORDER — CEFAZOLIN SODIUM-DEXTROSE 2-4 GM/100ML-% IV SOLN: 2.0000 g | Freq: Three times a day (TID) | INTRAVENOUS | Status: DC

## 2023-12-11 MED ORDER — HEPARIN SOD (PORK) LOCK FLUSH 100 UNIT/ML IV SOLN
INTRAVENOUS | Status: AC
  Filled 2023-12-11: qty 5

## 2023-12-11 MED ORDER — VANCOMYCIN HCL IN DEXTROSE 1-5 GM/200ML-% IV SOLN
INTRAVENOUS | Status: AC
  Filled 2023-12-11: qty 200

## 2023-12-11 MED ORDER — MIDAZOLAM HCL (PF) 2 MG/2ML IJ SOLN
INTRAMUSCULAR | Status: AC | PRN
  Administered 2023-12-11: .5 mg via INTRAVENOUS
  Administered 2023-12-11: 1 mg via INTRAVENOUS

## 2023-12-11 MED ORDER — HEPARIN SOD (PORK) LOCK FLUSH 100 UNIT/ML IV SOLN
500.0000 [IU] | Freq: Once | INTRAVENOUS | Status: AC
  Administered 2023-12-11: 500 [IU] via INTRAVENOUS

## 2023-12-11 MED ORDER — FENTANYL CITRATE (PF) 100 MCG/2ML IJ SOLN
INTRAMUSCULAR | Status: AC | PRN
  Administered 2023-12-11: 25 ug via INTRAVENOUS
  Administered 2023-12-11: 50 ug via INTRAVENOUS

## 2023-12-11 MED ORDER — LIDOCAINE-EPINEPHRINE 1 %-1:100000 IJ SOLN
INTRAMUSCULAR | Status: AC
  Filled 2023-12-11: qty 1

## 2023-12-11 MED ORDER — MIDAZOLAM HCL 2 MG/2ML IJ SOLN
INTRAMUSCULAR | Status: AC
Start: 2023-12-11 — End: 2023-12-11
  Filled 2023-12-11: qty 2

## 2023-12-11 NOTE — Procedures (Signed)
 Vascular and Interventional Radiology Procedure Note  Patient: Jerome Washington DOB: Jun 13, 1970 Medical Record Number: [id number] Note Date/Time: 12/11/23 1:45 PM   Performing Physician: Thom Hall, MD Assistant(s): None  Diagnosis: Head and Neck cancer  Procedure: PORT PLACEMENT  Anesthesia: Conscious Sedation Complications: None Estimated Blood Loss: Minimal  Findings:  Successful right-sided port placement, with the tip of the catheter in the proximal right atrium.  Plan: Catheter ready for use.  See detailed procedure note with images in PACS. The patient tolerated the procedure well without incident or complication and was returned to Recovery in stable condition.    Thom Hall, MD [facility] Inter[facility] Radiology   Pager. [phone] Clinic. [phone]

## 2023-12-11 NOTE — Progress Notes (Signed)
 Discharge instructions reviewed with patient at bedside and friend via phone. Denies questions concerns. PT tolerated PO intake. Ambulated  and was able to void without difficulty. Site remains clean dry and intact. No s/s of complications. PT escorted from the unit via wheel chair to personal vehicle.

## 2023-12-11 NOTE — H&P (Signed)
 Chief Complaint: Oropharyngeal CA  Referring Provider(s): Davonna Siad   Supervising Physician: Hughes Simmonds  Patient Status: [facility] - Out-pt  History of Present Illness: Jerome Washington is a 53 y.o. 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 Initially presented to ED at [facility] on 10.26.25 with throat pain X 1 week and difficulty swallowing. Found to have an oropharyngeal mass. S/p mass biopsy and tracheostomy placement and on 10.28.25. Known to IR from G tube placed 10.29.25 by Dr. Vanice. Patient returns today for port placement for chemotherapy.    Confirms NPO since MN and ride/supervision available for 24 hours.   Denies fever, chills, SOB, CP, N/V, abd pain, blood in stool or urine, abnormal bruising, leg swelling. Endorses HA.   Allergies Reviewed:  Bee venom, Morphine  and codeine , and Penicillins   Patient is Full Code  Past Medical History:  Diagnosis Date   Cancer (HCC)    mouth   Chronic back pain    Hypertension    Myocardial infarction (HCC)    Sclerosis, hereditary spinal (HCC)     Past Surgical History:  Procedure Laterality Date   IR GASTROSTOMY TUBE MOD SED  11/19/2023   MICROLARYNGOSCOPY WITH LASER N/A 11/17/2023   Procedure: DIRECT LARYNOSCOPY WITH BIOPSY;  Surgeon: Tobie Eldora NOVAK, MD;  Location: [facility] OR;  Service: ENT;  Laterality: N/A;   NECK SURGERY     mouth cancer   TRACHEOSTOMY     TRACHEOSTOMY TUBE PLACEMENT N/A 11/17/2023   Procedure: CREATION, TRACHEOSTOMY;  Surgeon: Tobie Eldora NOVAK, MD;  Location: [facility] OR;  Service: ENT;  Laterality: N/A;  AWAKE Tracheostomy v/s Fiberoptic intubation, direct laryngoscopy with Biopsy      Medications: Prior to Admission medications   Medication Sig Start Date End Date Taking? Authorizing Provider  acetaminophen  (TYLENOL ) 500 MG tablet Crush and place 1 tablet (500 mg total) into feeding tube every 8 (eight) hours  as needed as directed. 11/26/23 11/25/24 Yes Dennise Lavada POUR, MD  albuterol  (PROVENTIL ) (2.5 MG/3ML) 0.083% nebulizer solution Take 3 mLs (2.5 mg total) by nebulization every 2 (two) hours as needed for wheezing or shortness of breath. 11/26/23  Yes Singh, Prashant K, MD  docusate (COLACE) 50 MG/5ML liquid Place 10 mLs (100 mg total) into feeding tube 2 (two) times daily as needed for mild constipation. 11/26/23  Yes Singh, Prashant K, MD  ferrous sulfate 324 MG TBEC Take 324 mg by mouth.   Yes [provider]  hydrOXYzine  (VISTARIL ) 25 MG capsule Take 1 capsule (25 mg total) by mouth at bedtime as needed for anxiety. 12/08/23  Yes Patel, Kunjan B, MD  Multiple Vitamin (MULTIVITAMIN WITH MINERALS) TABS tablet Crush and place 1 tablet into feeding tube daily as directed. 11/26/23  Yes Dennise Lavada POUR, MD  Nutritional Supplements (FEEDING SUPPLEMENT, OSMOLITE 1.5 CAL,) LIQD Place 1,000 mLs into feeding tube continuous. 35cc/hr 11/26/23  Yes Singh, Prashant K, MD  traZODone (DESYREL) 50 MG tablet Take 50 mg by mouth daily. 11/09/23  Yes [provider]  polyethylene glycol powder (GLYCOLAX /MIRALAX ) 17 GM/SCOOP powder Take 17 g by mouth daily as needed for moderate constipation. Dissolve 1 capful (17g) in 4-8 ounces of liquid and take by mouth daily. Patient not taking: Reported on 12/09/2023 11/26/23   Singh, Prashant K, MD  predniSONE  (DELTASONE ) 20 MG tablet TAKE 1 TABLET BY MOUTH ONCE DAILY WITH BREAKFAST FOR 5 DAYS    [provider]  Protein (FEEDING SUPPLEMENT, PROSOURCE TF20,) liquid Place 60 mLs into feeding tube 2 (two) times daily. Patient not taking: Reported on 12/09/2023 11/26/23   Singh, Prashant K, MD  Water  For Irrigation, Sterile (FREE WATER ) SOLN Place 200 mLs into feeding tube every 8 (eight) hours. 11/26/23   Dennise Lavada POUR, MD     No family history on file.  Social History   Socioeconomic History   Marital status: Divorced    Spouse name: Not on file    Number of children: Not on file   Years of education: Not on file   Highest education level: Not on file  Occupational History   Not on file  Tobacco Use   Smoking status: Every Day    Current packs/day: 0.50    Types: Cigarettes   Smokeless tobacco: Never  Vaping Use   Vaping status: Never Used  Substance and Sexual Activity   Alcohol use: Yes    Comment: daily 6 pack   Drug use: No   Sexual activity: Not Currently  Other Topics Concern   Not on file  Social History Narrative   Not on file   Social Drivers of Health   Financial Resource Strain: Not on file  Food Insecurity: No Food Insecurity (11/18/2023)   Hunger Vital Sign    Worried About Running Out of Food in the Last Year: Never true    Ran Out of Food in the Last Year: Never true  Transportation Needs: No Transportation Needs (11/18/2023)   PRAPARE - Administrator, Civil Service (Medical): No    Lack of Transportation (Non-Medical): No  Physical Activity: Not on file  Stress: Not on file  Social Connections: Not on file     Review of Systems: A 12 point ROS discussed and pertinent positives are indicated in the HPI above.  All other systems are negative.    Vital Signs: BP (!) 146/90   Pulse 100   Temp 98.1 F (36.7 C) (Oral)   Resp 17   Ht 5' 11 (1.803 m)   Wt 115 lb (52.2 kg)   SpO2 100%   BMI 16.04 kg/m     Physical Exam Constitutional:      Appearance: He is ill-appearing.  HENT:     Mouth/Throat:     Mouth: Mucous membranes are moist.  Cardiovascular:     Rate and Rhythm: Normal rate and regular rhythm.     Pulses: Normal pulses.     Heart sounds: Normal heart sounds.  Pulmonary:     Effort: Pulmonary effort is normal.     Breath sounds: Normal breath sounds.     Comments: Trach in place Abdominal:     General: There is no distension.     Palpations: Abdomen is soft.     Tenderness: There is no abdominal tenderness.     Comments: G tube present and without  concerns on exam  Skin:    General: Skin is warm and dry.  Neurological:     Mental Status: He is alert and oriented to person, place, and time.  Psychiatric:        Mood and Affect: Mood normal.        Behavior: Behavior normal.        Thought Content: Thought content normal.        Judgment: Judgment normal.     Imaging: NM PET Image Initial (PI) Skull Base To Thigh (F-18 FDG) Result Date: 12/04/2023 CLINICAL DATA:  Initial treatment strategy for oropharyngeal cancer. EXAM: NUCLEAR MEDICINE PET SKULL BASE TO THIGH TECHNIQUE: 5.88 mCi F-18 FDG was injected intravenously. Full-ring PET imaging was performed from the skull base to thigh after the radiotracer. CT data was obtained and used for attenuation correction and anatomic localization. Fasting blood glucose: 118 mg/dl COMPARISON:  CT neck November 15, 2023, CT abdomen pelvis November 18, 2023. FINDINGS: Mediastinal blood pool activity: SUV max 2.1 Liver activity: SUV max 2.4 NECK: There is a large hypermetabolic mass involving the bilateral posterior oropharynx (crossing midline) measuring approximately 7.5 x 3 cm with max SUV 17.4 consistent with known malignancy, previously measures 6.3 x 3.2 cm on CT. Patient is status post partial glossectomy and bilateral neck dissection. Additional hypermetabolic lesion in right posterolateral retropharyngeal/supraglottic measuring 3.5 x 2.4 cm with max SUV 11.1, likely metastatic disease, previously measured 2.3 x 1.5 cm. Subcutaneous left parotid gland photopenic cystic lesion is stable to prior measuring 2.7 x 1.8 cm likely benign. Subcutaneous left cheek photopenic lesion measuring 1.6 x 1.2 cm likely benign, stable to prior. Tracheostomy tube in place in proper position. Left maxillary sinus mucous retention cyst. CHEST: Subcentimeter nodule in posteromedial right lower lobe measuring 13 mm with a solid component measuring 6 mm (204/41). Biapical pleuroparenchymal scarring. Left lower lobe pulmonary  micronodule (204/37). below PET resolution. Incidental CT findings: Atherosclerotic calcifications of coronary arteries. ABDOMEN/PELVIS: No suspicious findings to suggest metastatic disease. Cystic photopenic structure in right scrotal sac likely benign, likely epididymal cyst. Incidental CT findings: Gastrostomy tube in place. SKELETON: No suspicious osseous lesion. Multilevel degenerative changes of the spine. Incidental CT findings: None. IMPRESSION: Large hypermetabolic mass involving the posterior oropharynx with metastatic disease involving the right posterolateral retropharyngeal space consistent with recurrent malignancy. Recommend further assessment with dedicated CT or MRI with contrast. No suspicious finding to suggest new distant metastasis within the abdomen and pelvis. Benign photopenic cystic lesions involving the left parotid gland and left cheek. Electronically Signed   By: Megan  Zare M.D.   On: 12/04/2023 15:33   DG Swallowing Func-Speech Pathology Result Date: 11/25/2023 Table formatting from the original result was not included. Modified Barium Swallow Study Patient Details Name: EBERT FORRESTER MRN: [id number] Date of Birth: 02-Oct-1970 Today's Date: 11/25/2023 HPI/PMH: HPI: Patient is a 53 y.o. male who presented 10/28 for operative treatment of oropharynx mass. Pt underwent tracheostomy on 10/28 due to respiratory distress. 6 cuffed trach initially placed, plan for 4 cuffless eventually. ENT reports Large oropharynx mass almost 270 degrees; tongue base did not appear involved but difficult to see entirety given bulk of mass. Distal airway including epiglottis and larynx not involved. 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 Pt has reported recent difficulty swallowing and has been observed to be malnourished. Pt had PEG tube placed on 11/18/23 to supplement nutrition. Trach changed to #6 cuffless 10/31. CXR 11/1 shows LLL opacity,  concerning for aspiration. MBS attempted 10/31 but unsuccessful due to period of desaturation. Required HHF 11/1-11/4. MBS 11/5 showed diffuse pharyngeal residue secondary to reduced BOT retraction, large oropharyngeal mass, and prominent CP bar resulting in silent aspiration after the swallow of liquids and frank penetration of purees. Clinical Impression: Pt exhibits profound oropharyngeal dysphagia secondary to the chronicity of aspiration and the volume of residue regardless of consistency. The large oropharyngeal mass protrudes from the posterior pharyngeal wall at the level of the epiglottis, prohibiting inversion, though this seems to benefit laryngeal closure during  the swallow as the mass approximates laryngeal structures. Airway invasion occurs most readily after the swallow due to the volume that accumulates within the pyriform sinuses. Aspiration is not consistently sensed and a cued cough, although forceful, does not clear aspirates (PAS 8). Residue increases with purees, resulting in frank penetration (PAS 5). Multiple effortful swallows clear some residuals but not all. This is significantly impacted by a prominent cricopharyngeus, allowing only minimal amounts of bolus to clear the pharynx with each swallow. A chin tuck and bilateral head turns do not yield improved airway protection or decreased residue. The super supraglottic swallow maneuver appeared to allow improved PES distension, though pyriform sinus residue remains diffuse and risk of post-prandial aspiration exists.  Per chart review and pt report, he had been experiencing pain with swallowing since August 2025. Although there are no previous instrumental studies to compare, suspect both the oropharyngeal mass and prominent cricopharyngeus have been present PTA with pt remaining on a regular diet without adverse pulmonary consequences. He developed pneumonia while admitted and is currently at increased risk secondary to limited mobility,  trach placement, and deconditioning. Recommend he remain NPO with ongoing f/u on an OP basis pending biopsy results and subsequent plan. Factors that may increase risk of adverse event in presence of aspiration Noe & Lianne 2021): Factors that may increase risk of adverse event in presence of aspiration Noe & Lianne 2021): Poor general health and/or compromised immunity; Frail or deconditioned; Presence of tubes (ETT, trach, NG, etc.) Recommendations/Plan: Swallowing Evaluation Recommendations Swallowing Evaluation Recommendations Recommendations: NPO Medication Administration: Via alternative means Oral care recommendations: Oral care QID (4x/day); Oral care before ice chips/water  Treatment Plan Treatment Plan Treatment recommendations: Therapy as outlined in treatment plan below Follow-up recommendations: Outpatient SLP Functional status assessment: Patient has had a recent decline in their functional status and demonstrates the ability to make significant improvements in function in a reasonable and predictable amount of time. Treatment frequency: Min 2x/week Treatment duration: 2 weeks Interventions: Aspiration precaution training; Compensatory techniques; Patient/family education; Trials of upgraded texture/liquids Recommendations Recommendations for follow up therapy are one component of a multi-disciplinary discharge planning process, led by the attending physician.  Recommendations may be updated based on patient status, additional functional criteria and insurance authorization. Assessment: Orofacial Exam: Orofacial Exam Oral Cavity: Oral Hygiene: WFL Oral Cavity - Dentition: Edentulous Orofacial Anatomy: WFL Oral Motor/Sensory Function: WFL Anatomy: Anatomy: Suspected cervical osteophytes; Prominent cricopharyngeus Boluses Administered: Boluses Administered Boluses Administered: Thin liquids (Level 0); Mildly thick liquids (Level 2, nectar thick); Puree  Oral Impairment Domain: Oral Impairment  Domain Lip Closure: No labial escape Tongue control during bolus hold: Cohesive bolus between tongue to palatal seal Bolus preparation/mastication: Timely and efficient chewing and mashing Bolus transport/lingual motion: Brisk tongue motion Oral residue: Trace residue lining oral structures Location of oral residue : Tongue; Palate Initiation of pharyngeal swallow : Pyriform sinuses  Pharyngeal Impairment Domain: Pharyngeal Impairment Domain Soft palate elevation: Trace column of contrast or air between SP and PW Laryngeal elevation: Partial superior movement of thyroid cartilage/partial approximation of arytenoids to epiglottic petiole Anterior hyoid excursion: Partial anterior movement Epiglottic movement: No inversion Laryngeal vestibule closure: Complete, no air/contrast in laryngeal vestibule Pharyngeal stripping wave : Present - diminished Pharyngeal contraction (A/P view only): N/A Pharyngoesophageal segment opening: Minimal distention/minimal duration, marked obstruction of flow Tongue base retraction: Wide column of contrast or air between tongue base and PPW Pharyngeal residue: Majority of contrast within or on pharyngeal structures Location of pharyngeal residue: Pharyngeal wall; Pyriform sinuses  Esophageal Impairment Domain: No data recorded Pill: No data recorded Penetration/Aspiration Scale Score: Penetration/Aspiration Scale Score 5.  Material enters airway, CONTACTS cords and not ejected out: Puree 8.  Material enters airway, passes BELOW cords without attempt by patient to eject out (silent aspiration) : Thin liquids (Level 0); Mildly thick liquids (Level 2, nectar thick) Compensatory Strategies: Compensatory Strategies Compensatory strategies: Yes Effortful swallow: Ineffective Ineffective Effortful Swallow: Thin liquid (Level 0); Mildly thick liquid (Level 2, nectar thick); Puree Multiple swallows: Ineffective Ineffective Multiple Swallows: Thin liquid (Level 0); Mildly thick liquid (Level 2,  nectar thick); Puree Chin tuck: Ineffective Ineffective Chin Tuck: Thin liquid (Level 0) Left head turn: Ineffective Ineffective Left Head Turn: Thin liquid (Level 0) Right head turn: Ineffective Ineffective Right Head Turn: Thin liquid (Level 0) Super supraglottic swallow: Effective Effective Super Supraglottic Swallow: Thin liquid (Level 0)   General Information: Caregiver present: No  Diet Prior to this Study: NPO; G-tube   Temperature : Normal   Respiratory Status: WFL   Supplemental O2: None (Room air)   History of Recent Intubation: No  Behavior/Cognition: Alert; Cooperative; Pleasant mood Self-Feeding Abilities: Able to self-feed Baseline vocal quality/speech: Dysphonic Volitional Cough: Able to elicit Volitional Swallow: Able to elicit Exam Limitations: No limitations Goal Planning: Prognosis for improved oropharyngeal function: Good Barriers to Reach Goals: Time post onset; Severity of deficits No data recorded Patient/Family Stated Goal: none stated Consulted and agree with results and recommendations: Patient Pain: Pain Assessment Pain Assessment: No/denies pain End of Session: Start Time:SLP Start Time (ACUTE ONLY): 1145 Stop Time: SLP Stop Time (ACUTE ONLY): 1205 Time Calculation:SLP Time Calculation (min) (ACUTE ONLY): 20 min Charges: SLP Evaluations $ SLP Speech Visit: 1 Visit SLP Evaluations $MBS Swallow: 1 Procedure $Speech Treatment for Individual: 1 Procedure SLP visit diagnosis: SLP Visit Diagnosis: Dysphagia, oropharyngeal phase (R13.12) Past Medical History: Past Medical History: Diagnosis Date  Cancer (HCC)   mouth  Chronic back pain   Hypertension   Myocardial infarction (HCC)   Sclerosis, hereditary spinal (HCC)  Past Surgical History: Past Surgical History: Procedure Laterality Date  IR GASTROSTOMY TUBE MOD SED  11/19/2023  MICROLARYNGOSCOPY WITH LASER N/A 11/17/2023  Procedure: DIRECT LARYNOSCOPY WITH BIOPSY;  Surgeon: Tobie Eldora NOVAK, MD;  Location: [facility] OR;  Service: ENT;  Laterality: N/A;   NECK SURGERY    mouth cancer  TRACHEOSTOMY    TRACHEOSTOMY TUBE PLACEMENT N/A 11/17/2023  Procedure: CREATION, TRACHEOSTOMY;  Surgeon: Tobie Eldora NOVAK, MD;  Location: [facility] OR;  Service: ENT;  Laterality: N/A;  AWAKE Tracheostomy v/s Fiberoptic intubation, direct laryngoscopy with Biopsy Damien Blumenthal, M.A., CCC-SLP Speech Language Pathology, Acute Rehabilitation Services Secure Chat preferred [phone] 11/25/2023, 1:32 PM  DG Chest Port 1 View Result Date: 11/24/2023 EXAM: 1 VIEW(S) XRAY OF THE CHEST 11/24/2023 06:30:00 AM COMPARISON: 11/22/2023 CLINICAL HISTORY: SOB (shortness of breath) FINDINGS: LINES, TUBES AND DEVICES: Tracheostomy tube in place. Percutaneous gastrostomy tube noted. LUNGS AND PLEURA: Improved aeration of left lung base. Decreased left basilar opacity. No pulmonary edema. No pleural effusion. No pneumothorax. HEART AND MEDIASTINUM: No acute abnormality of the cardiac and mediastinal silhouettes. BONES AND SOFT TISSUES: No acute osseous abnormality. IMPRESSION: 1. Improved aeration of the left lung base with decreased left basilar opacity. Electronically signed by: Waddell Calk MD 11/24/2023 07:35 AM EST RP Workstation: [id number]   DG Chest Port 1 View Result Date: 11/22/2023 EXAM: 1 AP VIEW XRAY OF THE CHEST 11/22/2023 05:07:00 AM COMPARISON: AP radiograph of the chest dated 11/21/2023. CLINICAL HISTORY: SOB (shortness of  breath). FINDINGS: LINES, TUBES AND DEVICES: A tracheostomy tube remains in satisfactory position. The gastrostomy tube is present. There are also surgical clips in the left upper quadrant. LUNGS AND PLEURA: Persistent elevation of the left hemidiaphragm with mild hazy opacification of the left medial lung base. No pulmonary edema. No pleural effusion. No pneumothorax. HEART AND MEDIASTINUM: No acute abnormality of the cardiac and mediastinal silhouettes. BONES AND SOFT TISSUES: No acute osseous abnormality. IMPRESSION: 1. Persistent elevation of the left hemidiaphragm  with mild hazy opacification of the left medial lung base. 2. Tracheostomy tube in satisfactory position. 3. Gastrostomy tube in place. Electronically signed by: Evalene Coho MD 11/22/2023 05:12 AM EST RP Workstation: [id number]   DG Chest Port 1 View Result Date: 11/21/2023 CLINICAL DATA:  Shortness of breath EXAM: PORTABLE CHEST 1 VIEW COMPARISON:  11/21/2023 at 12:08 a.m. FINDINGS: Single frontal view of the chest demonstrates stable tracheostomy tube. Continued dense consolidation at the medial left lung base without appreciable change since prior study. No new airspace disease, effusion, or pneumothorax. Cardiac silhouette is stable. No acute bony abnormalities. IMPRESSION: 1. Persistent retrocardiac left lower lobe consolidation consistent with pneumonia or atelectasis. Electronically Signed   By: Ozell Daring M.D.   On: 11/21/2023 12:46   DG Chest Port 1 View Result Date: 11/21/2023 EXAM: 1 VIEW(S) XRAY OF THE CHEST 11/21/2023 12:19:35 AM COMPARISON: 11/20/2023 CLINICAL HISTORY: 33497 Acute respiratory distress FINDINGS: LINES, TUBES AND DEVICES: Tracheostomy is unchanged. LUNGS AND PLEURA: Left lower lobe airspace opacity increased since prior study. No pulmonary edema. No pleural effusion. No pneumothorax. HEART AND MEDIASTINUM: No acute abnormality of the cardiac and mediastinal silhouettes. BONES AND SOFT TISSUES: Right supraclavicular subcutaneous emphysema has resolved. No acute osseous abnormality. IMPRESSION: 1. Increased left lower lobe airspace opacity since prior study. Appearance concerning for pneumonia. 2. Resolved right supraclavicular subcutaneous emphysema. Electronically signed by: Franky Crease MD 11/21/2023 12:28 AM EDT RP Workstation: [id number]   DG Chest Port 1 View Result Date: 11/20/2023 EXAM: 1 VIEW(S) XRAY OF THE CHEST 11/20/2023 07:41:00 AM COMPARISON: 11/17/2023 CLINICAL HISTORY: 200808 Hypoxia [id number] FINDINGS: LINES, TUBES AND DEVICES: Tracheostomy tube in place.  LUNGS AND PLEURA: Elevated left hemidiaphragm. Mild patchy left lung base opacity, slightly increased. No pulmonary edema. No pleural effusion. No pneumothorax. HEART AND MEDIASTINUM: No acute abnormality of the cardiac and mediastinal silhouettes. BONES AND SOFT TISSUES: Similar mild subcutaneous emphysema in right supraclavicular region. No acute osseous abnormality. IMPRESSION: 1. Mildly increased left basilar opacity, which may reflect atelectasis or aspiration/pneumonia. 2. Elevated left hemidiaphragm. 3. Tracheostomy tube in place. 4. Similar mild subcutaneous emphysema in the right supraclavicular region. 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 EXAM: PERC PLACEMENT GASTROSTOMY MEDICATIONS: Ancef  2 gm IV; Antibiotics were administered within 1 hour of the procedure. ANESTHESIA/SEDATION: Versed  2 mg IV; Fentanyl  100 mcg IV Moderate Sedation Time:  10 The patient was continuously monitored during the procedure by the interventional radiology nurse under my direct supervision. CONTRAST:  15mL OMNIPAQUE  IOHEXOL  300 MG/ML SOLN - administered into the gastric lumen. FLUOROSCOPY TIME:  Twelve mGy reference air kerma COMPLICATIONS: None immediate. PROCEDURE: Informed written consent was obtained from the patient after a thorough discussion of the procedural risks, benefits and alternatives. All questions were addressed. Maximal Sterile barrier Technique was utilized including caps, mask, sterile gowns, sterile gloves, sterile drape, hand hygiene and  skin antiseptic. A timeout was performed prior to the initiation of the procedure. The patient was placed on the procedure table in the supine position. Pre-procedure abdominal film confirmed visualization of the transverse colon. An angled 5-French catheter was  passed through the nares into the stomach. The patient was prepped and draped in usual sterile fashion. The stomach was insufflated with air via the indwelling nasogastric tube. Under fluoroscopy, a puncture site was selected and local analgesia achieved with 1% lidocaine  infiltrated subcutaneously. Under fluoroscopic guidance, a gastropexy needle was passed into the stomach and the T-bar suture was released. Entry into the stomach was confirmed with fluoroscopy, aspiration of air, and injection of contrast material. This was repeated with an additional gastropexy suture (for a total of 2 fasteners). At the center of these gastropexy sutures, a dermatotomy was performed. An 18 gauge needle was passed into the stomach at the site of this dermatotomy, and position within the gastric lumen again confirmed under fluoroscopy using aspiration of air and contrast injection. An Amplatz guidewire was passed through this needle and intraluminal placement within the stomach was confirmed by fluoroscopy. The needle was removed. Over the guidewire, the percutaneous tract was dilated using a 10 mm non-compliant balloon. The balloon was deflated, then pushed into the gastric lumen followed in concert by the 20 Fr gastrostomy tube. The retention balloon of the percutaneous gastrostomy tube was inflated with 10 mL of sterile water . The tube was withdrawn until the retention balloon was at the edge of the gastric lumen. The external bumper was brought to the abdominal wall. Contrast was injected through the gastrostomy tube, confirming intraluminal positioning. The patient tolerated the procedure well without any immediate post-procedural complications. IMPRESSION: Technically successful placement of 20 Fr gastrostomy tube. Ester Sides, MD [facility] Electronically Signed   By: Ester Sides M.D.   On: 11/19/2023 16:22   CT ABDOMEN WO CONTRAST Result Date:  11/18/2023 CLINICAL DATA:  52 year old male, preprocedure planning for percutaneous gastrostomy tube placement. EXAM: CT ABDOMEN WITHOUT CONTRAST TECHNIQUE: Multidetector CT imaging of the abdomen was performed following the standard protocol without IV contrast. RADIATION DOSE REDUCTION: This exam was performed according to the departmental dose-optimization program which includes automated exposure control, adjustment of the mA and/or kV according to patient size and/or use of iterative reconstruction technique. COMPARISON:  08/17/2022 FINDINGS: Lower chest: No acute abnormality. Hepatobiliary: No focal liver abnormality is seen. No gallstones, gallbladder wall thickening, or biliary dilatation. Pancreas: Unremarkable. No pancreatic ductal dilatation or surrounding inflammatory changes. Spleen: Normal in size without focal abnormality. Adrenals/Urinary Tract: Adrenal glands are unremarkable. Punctate left inferior pole nonobstructive renal calculus. The kidneys are otherwise normal. Stomach/Bowel: Stomach is within normal limits. Appendix appears normal. No evidence of bowel wall thickening, distention, or inflammatory changes. Vascular/Lymphatic: Aortic atherosclerosis. No enlarged abdominal lymph nodes. Other: No abdominal wall hernia or abnormality. Musculoskeletal: No acute or significant osseous findings. IMPRESSION: 1. Favorable anatomy for percutaneous gastrostomy tube placement. 2.  Aortic Atherosclerosis (ICD10-I70.0). Ester Sides, MD [facility] Electronically Signed   By: Ester Sides M.D.   On: 11/18/2023 15:04   DG CHEST PORT 1 VIEW Result Date: 11/17/2023 EXAM: 1 VIEW(S) XRAY OF THE CHEST 11/17/2023 07:09:00 PM COMPARISON: Comparison is made with 11/15/2023. CLINICAL HISTORY: [id number] Status post tracheostomy (HCC) [id number]. FINDINGS: LINES, TUBES AND DEVICES: Interval placement of tracheostomy tube tip about 3.6 cm superior to the carina.  LUNGS AND PLEURA: Mild atelectasis at the left  base. No focal pulmonary opacity. No pulmonary edema. No pleural effusion. No pneumothorax. HEART AND MEDIASTINUM: No acute abnormality of the cardiac and mediastinal silhouettes. BONES AND SOFT TISSUES: No acute osseous abnormality. IMPRESSION: 1. Tracheostomy tube placement as above. 2. Mild left basilar atelectasis. Electronically signed by: Luke Bun MD 11/17/2023 08:20 PM EDT RP Workstation: [id number]   CT Soft Tissue Neck W Contrast Result Date: 11/15/2023 EXAM: CT NECK WITH CONTRAST 11/15/2023 01:42:25 PM TECHNIQUE: CT of the neck was performed with the administration of 75 mL of iohexol  (OMNIPAQUE ) 300 MG/ML solution. Multiplanar reformatted images are provided for review. Automated exposure control, iterative reconstruction, and/or weight based adjustment of the mA/kV was utilized to reduce the radiation dose to as low as reasonably achievable. COMPARISON: CT neck with contrast 07/20/2019 at [facility]. The images are not currently available. CLINICAL HISTORY: Soft tissue infection suspected, neck, xray done. Pt to er, pt states that he was here a couple of weeks ago for the same, states that he was given abx for a sinus infection, but he is still having the sinus problems. FINDINGS: LIMITATIONS/ARTIFACTS: The study is mildly degraded by patient motion. AERODIGESTIVE TRACT: Status post partial glossectomy and neck dissection is noted. Diffuse mass-like mucosal thickening within the posterior oropharynx measures 6.3 x 3.2 x 4.3 cm, crossing midline. A 14 mm low density collection or lesion is present at the right glosso-tonsillar sulcus. SALIVARY GLANDS: A 4 mm calcification is present anteriorly in the right parotid gland. A subcutaneous cystic lesion creates some mass effect on the left parotid gland. The left parotid gland is otherwise unremarkable. The submandibular glands have been resected bilaterally. THYROID: Unremarkable. LYMPH NODES: A  right posterolateral retropharyngeal lymph node measures 2.3 x 1.5 cm. No other cervical adenopathy is present. SOFT TISSUES: A subcutaneous moderate density cystic lesion over the left parotid gland measures 3.2 x 2.0 x 2.8 cm. A slightly higher density subcutaneous lesion is present anteriorly in the left cheek measuring 1.7 x 1.4 cm. BRAIN, ORBITS, SINUSES AND MASTOIDS: No acute abnormality. LUNGS AND MEDIASTINUM: No acute abnormality. BONES: Chronic endplate sclerotic changes are present at C5-C6 with uncovertebral and foraminal narrowing bilaterally. An anterior osteophyte at C5 is fractured anteriorly. IMPRESSION: 1. Diffuse mass-like mucosal thickening within the posterior oropharynx measuring 6.3 x 3.2 x 4.3 cm, crossing midline, with a 14 mm low-density collection at the right glossotonsillar sulcus. 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 Recommend ent consultation 2. Right posterolateral retropharyngeal lymph node measuring 2.3 x 1.5 cm is consistent with metastatic disease. 3. Subcutaneous cystic lesion over the left parotid gland measuring 3.2 x 2.0 x 2.8 cm, and a subcutaneous lesion in the left cheek measuring 1.7 x 1.4 cm. These are nonspecific these may represent dermoid inclusion cysts. Metastatic disease is considered less likely. Electronically signed by: Lonni Necessary MD 11/15/2023 02:04 PM EDT RP Workstation: [id number]   DG Chest Portable 1 View Result Date: 11/15/2023 EXAM: 1 VIEW(S) XRAY OF THE CHEST 11/15/2023 12:54:00 PM COMPARISON: 07/21/21 CLINICAL HISTORY: cough. Per chart: Pt to er, pt states that he was here a couple of weeks ago for the same, states that he was given abx for a sinus infection, but he is still having the sinus problems. FINDINGS: LUNGS AND PLEURA: Pulmonary hyperinflation. No focal pulmonary opacity. No pulmonary edema. No pleural effusion. No pneumothorax. HEART AND MEDIASTINUM: No acute abnormality of the cardiac and  mediastinal silhouettes. BONES AND SOFT TISSUES: No acute osseous abnormality. IMPRESSION: 1. No acute cardiopulmonary process  identified. Electronically signed by: Waddell Calk MD 11/15/2023 01:02 PM EDT RP Workstation: [id number]    Labs:  CBC: Recent Labs    11/24/23 0413 11/25/23 0257 11/26/23 0306 12/09/23 1416  WBC 8.3 8.6 8.0 8.3  HGB 11.6* 10.7* 11.2* 10.9*  HCT 34.8* 32.6* 33.5* 34.1*  PLT 370 377 409* 401*    COAGS: Recent Labs    11/19/23 0214  INR 0.9    BMP: Recent Labs    11/24/23 0413 11/25/23 0257 11/26/23 0306 12/09/23 1416  NA 133* 128* 133* 135  K 3.8 3.4* 3.8 4.4  CL 94* 88* 95* 97*  CO2 30 30 27 30   GLUCOSE 114* 89 93 88  BUN 12 10 <5* 9  CALCIUM 9.6 9.2 8.8* 9.6  CREATININE 0.63 0.51* 0.49* 0.44*  GFRNONAA >60 >60 >60 >60    LIVER FUNCTION TESTS: Recent Labs    11/15/23 1111 11/19/23 0214 12/09/23 1416  BILITOT 0.5 0.9 0.3  AST 22 17 16   ALT 6 8 9   ALKPHOS 121 57 102  PROT 7.4 5.8* 7.1  ALBUMIN 4.0 2.6* 3.8    TUMOR MARKERS: No results for input(s): AFPTM, CEA, CA199, CHROMGRNA in the last 8760 hours.  Assessment and Plan:  Patient presenting for image guided right port insertion.  No contraindications for procedure identified in ROS, physical exam, or review of pre-sedation considerations. Labs reviewed and within acceptable range  VSS, afebrile Patient not asked to hold any AC/AP for this low bleeding risk procedure Abx not indicated    Risks and benefits of image guided port-a-catheter placement was discussed with the patient including, but not limited to bleeding, infection, pneumothorax, or fibrin sheath development and need for additional procedures.  All of the patient's questions were answered, patient is agreeable to proceed. Consent signed and in chart.   Thank you for allowing our service to participate in [facility] 's care.    Electronically Signed: Laymon Coast, NP   12/11/2023,  12:48 PM     I spent a total of  15 Minutes   in face to face in clinical consultation, greater than 50% of which was counseling/coordinating care for image guided port insertion.    (A copy of this note was sent to the referring provider and the time of visit.)

## 2023-12-14 ENCOUNTER — Inpatient Hospital Stay

## 2023-12-14 DIAGNOSIS — C109 Malignant neoplasm of oropharynx, unspecified: Secondary | ICD-10-CM

## 2023-12-14 MED ORDER — LIDOCAINE-PRILOCAINE 2.5-2.5 % EX CREA: 1.0000 | TOPICAL_CREAM | CUTANEOUS | 1 refills | Status: AC | PRN

## 2023-12-14 MED ORDER — PROCHLORPERAZINE MALEATE 10 MG PO TABS: 10.0000 mg | ORAL_TABLET | Freq: Four times a day (QID) | ORAL | 0 refills | Status: AC | PRN

## 2023-12-14 NOTE — Patient Instructions (Signed)
 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  You well receive weekly chemotherapies called Taxol and Carboplatin.   You will see the doctor regularly throughout treatment.  We will obtain blood work from you prior to every treatment and monitor your results to make sure it is safe to give your treatment. The doctor monitors your response to treatment by the way you are feeling, your blood work, and by obtaining scans periodically.  There will be wait times while you are here for treatment.  It will take about 30 minutes to 1 hour for your lab work to result.  Then there will be wait times while pharmacy mixes your medications.   Medications you will receive in the clinic prior to your chemotherapy medications:   Aloxi:  ALOXI is used in adults to help prevent the nausea and vomiting that happens with certain chemotherapy drugs.  Aloxi is a long acting medication, and will remain in your system for about 2 days.    Dexamethasone :  This is a steroid given prior to chemotherapy to help prevent allergic reactions; it may also help prevent and control nausea and diarrhea.    Pepcid:  This medication is a histamine blocker that helps prevent and allergic reaction to your chemotherapy.    Benadryl :  This is a histamine blocker (different from the Pepcid) that helps prevent allergic/infusion reactions to your chemotherapy. This medication may cause dizziness/drowsiness.   Paclitaxel (Taxol)  About This Drug Paclitaxel is a drug used to treat cancer. It is given in the vein (IV).  This will take 1 hour to infuse.  This first infusion will take longer to infuse because it is increased slowly to monitor for reactions.  The nurse will be in the room with you for the first 15 minutes of the first infusion.  Possible Side Effects   Hair loss. Hair loss is often temporary, although with certain medicine, hair loss can sometimes be  permanent. Hair loss may happen suddenly or gradually. If you lose hair, you may lose it from your head, face, armpits, pubic area, chest, and/or legs. You may also notice your hair getting thin.   Swelling of your legs, ankles and/or feet (edema)   Flushing   Nausea and throwing up (vomiting)   Loose bowel movements (diarrhea)   Bone marrow depression. This is a decrease in the number of white blood cells, red blood cells, and platelets. This may raise your risk of infection, make you tired and weak (fatigue), and raise your risk of bleeding.   Effects on the nerves are called peripheral neuropathy. You may feel numbness, tingling, or pain in your hands and feet. It may be hard for you to button your clothes, open jars, or walk as usual. The effect on the nerves may get worse with more doses of the drug. These effects get better in some people after the drug is stopped but it does not get better in all people.   Changes in your liver function   Bone, joint and muscle pain   Abnormal EKG   Allergic reaction: Allergic reactions, including anaphylaxis are rare but may happen in some patients. Signs of allergic reaction to this drug may be swelling of the face, feeling like your tongue or throat are swelling, trouble breathing, rash, itching, fever, chills, feeling dizzy, and/or feeling that your heart is beating in a fast or not normal way. If this happens, do not take  another dose of this drug. You should get urgent medical treatment.   Infection   Changes in your kidney function.  Note: Each of the side effects above was reported in 20% or greater of patients treated with paclitaxel. Not all possible side effects are included above.  Warnings and Precautions   Severe allergic reactions   Severe bone marrow depression  Treating Side Effects   To help with hair loss, wash with a mild shampoo and avoid washing your hair every day.   Avoid rubbing your scalp, instead, pat your hair  or scalp dry   Avoid coloring your hair   Limit your use of hair spray, electric curlers, blow dryers, and curling irons.   If you are interested in getting a wig, talk to your nurse. You can also call the [employer] at 800-ACS-2345 to find out information about the "Look Good, Feel Better" program close to where you live. It is a free program where women getting chemotherapy can learn about wigs, turbans and scarves as well as makeup techniques and skin and nail care.   Ask your doctor or nurse about medicines that are available to help stop or lessen diarrhea and/or nausea.   To help with nausea and vomiting, eat small, frequent meals instead of three large meals a day. Choose foods and drinks that are at room temperature. Ask your nurse or doctor about other helpful tips and medicine that is available to help or stop lessen these symptoms.   If you get diarrhea, eat low-fiber foods that are high in protein and calories and avoid foods that can irritate your digestive tracts or lead to cramping. Ask your nurse or doctor about medicine that can lessen or stop your diarrhea.   Mouth care is very important. Your mouth care should consist of routine, gentle cleaning of your teeth or dentures and rinsing your mouth with a mixture of 1/2 teaspoon of salt in 8 ounces of water  or  teaspoon of baking soda in 8 ounces of water . This should be done at least after each meal and at bedtime.   If you have mouth sores, avoid mouthwash that has alcohol. Also avoid alcohol and smoking because they can bother your mouth and throat.   Drink plenty of fluids (a minimum of eight glasses per day is recommended).   Take your temperature as your doctor or nurse tells you, and whenever you feel like you may have a fever.   Talk to your doctor or nurse about precautions you can take to avoid infections and bleeding.   Be careful when cooking, walking, and handling sharp objects and hot  liquids.  Food and Drug Interactions   There are no known interactions of paclitaxel with food.   This drug may interact with other medicines. Tell your doctor and pharmacist about all the medicines and dietary supplements (vitamins, minerals, herbs and others) that you are taking at this time.   The safety and use of dietary supplements and alternative diets are often not known. Using these might affect your cancer or interfere with your treatment. Until more is known, you should not use dietary supplements or alternative diets without your cancer doctor's help.  When to Call the Doctor  Call your doctor or nurse if you have any of the following symptoms and/or any new or unusual symptoms:   Fever of 100.4 F (38 C) or above   Chills   Redness, pain, warmth, or swelling at the IV site during  the infusion   Signs of allergic reaction: swelling of the face, feeling like your tongue or throat are swelling, trouble breathing, rash, itching, fever, chills, feeling dizzy, and/or feeling that your heart is beating in a fast or not normal way   Feeling that your heart is beating in a fast or not normal way (palpitations)   Weight gain of 5 pounds in one week (fluid retention)   Decreased urine or very dark urine   Signs of liver problems: dark urine, pale bowel movements, bad stomach pain, feeling very tired and weak, unusual  itching, or yellowing of the eyes or skin   Heavy menstrual period that lasts longer than normal   Easy bruising or bleeding   Nausea that stops you from eating or drinking, and/or that is not relieved by prescribed medicines.   Loose bowel movements (diarrhea) more than 4 times a day or diarrhea with weakness or lightheadedness   Pain in your mouth or throat that makes it hard to eat or drink   Lasting loss of appetite or rapid weight loss of five pounds in a week   Signs of peripheral neuropathy: numbness, tingling, or decreased feeling in fingers or toes;  trouble walking or changes in the way you walk; or feeling clumsy when buttoning clothes, opening jars, or other routine activities   Joint and muscle pain that is not relieved by prescribed medicines   Extreme fatigue that interferes with normal activities   While you are getting this drug, please tell your nurse right away if you have any pain, redness, or swelling at the site of the IV infusion.   If you think you are pregnant.  Reproduction Warnings   Pregnancy warning: This drug may have harmful effects on the unborn child, it is recommended that effective methods of birth control should be used during your cancer treatment. Let your doctor know right away if you think you may be pregnant.   Breast feeding warning: Women should not breast feed during treatment because this drug could enter the breastmilk and cause harm to a breast feeding baby.   Carboplatin (Paraplatin, CBDCA)  About This Drug  Carboplatin is used to treat cancer. It is given in the vein (IV).  It will take 30 minutes to infuse.   Possible Side Effects   Bone marrow suppression. This is a decrease in the number of white blood cells, red blood cells, and platelets. This may raise your risk of infection, make you tired and weak (fatigue), and raise your risk of bleeding.   Nausea and vomiting (throwing up)   Weakness   Changes in your liver function   Changes in your kidney function   Electrolyte changes   Pain  Note: Each of the side effects above was reported in 20% or greater of patients treated with carboplatin. Not all possible side effects are included above.   Warnings and Precautions   Severe bone marrow suppression   Allergic reactions, including anaphylaxis are rare but may happen in some patients. Signs of allergic reaction to this drug may be swelling of the face, feeling like your tongue or throat are swelling, trouble breathing, rash, itching, fever, chills, feeling dizzy, and/or  feeling that your heart is beating in a fast or not normal way. If this happens, do not take another dose of this drug. You should get urgent medical treatment.   Severe nausea and vomiting   Effects on the nerves are called peripheral neuropathy. This risk is increased  if you are over the age of 9 or if you have received other medicine with risk of peripheral neuropathy. You may feel numbness, tingling, or pain in your hands and feet. It may be hard for you to button your clothes, open jars, or walk as usual. The effect on the nerves may get worse with more doses of the drug. These effects get better in some people after the drug is stopped but it does not get better in all people.   Blurred vision, loss of vision or other changes in eyesight   Decreased hearing   - Skin and tissue irritation including redness, pain, warmth, or swelling at the IV site if the drug leaks out of the vein and into nearby tissue.   Severe changes in your kidney function, which can cause kidney failure   Severe changes in your liver function, which can cause liver failure  Note: Some of the side effects above are very rare. If you have concerns and/or questions, please discuss them with your medical team.   Important Information   This drug may be present in the saliva, tears, sweat, urine, stool, vomit, semen, and vaginal secretions. Talk to your doctor and/or your nurse about the necessary precautions to take during this time.   Treating Side Effects   Manage tiredness by pacing your activities for the day.   Be sure to include periods of rest between energy-draining activities.   To decrease the risk of infection, wash your hands regularly.   Avoid close contact with people who have a cold, the flu, or other infections.   Take your temperature as your doctor or nurse tells you, and whenever you feel like you may have a fever.   To help decrease the risk of bleeding, use a soft toothbrush. Check  with your nurse before using dental floss.   Be very careful when using knives or tools.   Use an electric shaver instead of a razor.   Drink plenty of fluids (a minimum of eight glasses per day is recommended).   If you throw up or have loose bowel movements, you should drink more fluids so that you do not become dehydrated (lack of water  in the body from losing too much fluid).   To help with nausea and vomiting, eat small, frequent meals instead of three large meals a day. Choose foods and drinks that are at room temperature. Ask your nurse or doctor about other helpful tips and medicine that is available to help stop or lessen these symptoms.   If you have numbness and tingling in your hands and feet, be careful when cooking, walking, and handling sharp objects and hot liquids.   Keeping your pain under control is important to your well-being. Please tell your doctor or nurse if you are experiencing pain.   Food and Drug Interactions   There are no known interactions of carboplatin with food.   This drug may interact with other medicines. Tell your doctor and pharmacist about all the prescription and over-the-counter medicines and dietary supplements (vitamins, minerals, herbs and others) that you are taking at this time. Also, check with your doctor or pharmacist before starting any new prescription or over-the-counter medicines, or dietary supplements to make sure that there are no interactions.   When to Call the Doctor  Call your doctor or nurse if you have any of these symptoms and/or any new or unusual symptoms:   Fever of 100.4 F (38 C) or higher  Chills   Tiredness that interferes with your daily activities   Feeling dizzy or lightheaded   Easy bleeding or bruising   Nausea that stops you from eating or drinking and/or is not relieved by prescribed medicines   Throwing up   Blurred vision or other changes in eyesight   Decrease in hearing or ringing in the  ear   Signs of allergic reaction: swelling of the face, feeling like your tongue or throat are swelling, trouble breathing, rash, itching, fever, chills, feeling dizzy, and/or feeling that your heart is beating in a fast or not normal way. If this happens, call 911 for emergency care.   Signs of possible liver problems: dark urine, pale bowel movements, bad stomach pain, feeling very tired and weak, unusual itching, or yellowing of the eyes or skin   Decreased urine, or very dark urine   Numbness, tingling, or pain in your hands and feet   Pain that does not go away or is not relieved by prescribed medicine   While you are getting this drug, please tell your nurse right away if you have any pain, redness, or swelling at the site of the IV infusion, or if you have any new onset of symptoms, or if you just feel different from before when the infusion was started.   Reproduction Warnings   Pregnancy warning: This drug may have harmful effects on the unborn baby. Women of child bearing potential should use effective methods of birth control during your cancer treatment. Let your doctor know right away if you think you may be pregnant.   Breastfeeding warning: It is not known if this drug passes into breast milk. For this reason, women should not breastfeed during treatment because this drug could enter the breast milk and cause harm to a breastfeeding baby.   Fertility warning: Human fertility studies have not been done with this drug. Talk with your doctor or nurse if you plan to have children. Ask for information on sperm or egg banking.   SELF CARE ACTIVITIES WHILE RECEIVING CHEMOTHERAPY:  Hydration Increase your fluid intake 48 hours prior to treatment and drink at least 8 to 12 cups (64 ounces) of water /decaffeinated beverages per day after treatment. You can still have your cup of coffee or soda but these beverages do not count as part of your 8 to 12 cups that you need to drink  daily. No alcohol intake.  Medications Continue taking your normal prescription medication as prescribed.  If you start any new herbal or new supplements please let us  know first to make sure it is safe.  Mouth Care Have teeth cleaned professionally before starting treatment. Keep dentures and partial plates clean. Use soft toothbrush and do not use mouthwashes that contain alcohol. Biotene is a good mouthwash that is available at most pharmacies or may be ordered by calling [phone]. Use warm salt water  gargles (1 teaspoon salt per 1 quart warm water ) before and after meals and at bedtime. If you need dental work, please let the doctor know before you go for your appointment so that we can coordinate the best possible time for you in regards to your chemo regimen. You need to also let your dentist know that you are actively taking chemo. We may need to do labs prior to your dental appointment.  Skin Care Always use sunscreen that has not expired and with SPF (Sun Protection Factor) of 50 or higher. Wear hats to protect your head from the sun. Remember  to use sunscreen on your hands, ears, face, & feet.  Use good moisturizing lotions such as udder cream, eucerin, or even Vaseline. Some chemotherapies can cause dry skin, color changes in your skin and nails.    Avoid long, hot showers or baths. Use gentle, fragrance-free soaps and laundry detergent. Use moisturizers, preferably creams or ointments rather than lotions because the thicker consistency is better at preventing skin dehydration. Apply the cream or ointment within 15 minutes of showering. Reapply moisturizer at night, and moisturize your hands every time after you wash them.  Hair Loss (if your doctor says your hair will fall out)  If your doctor says that your hair is likely to fall out, decide before you begin chemo whether you want to wear a wig. You may want to shop before treatment to match your hair color. Hats, turbans, and  scarves can also camouflage hair loss, although some people prefer to leave their heads uncovered. If you go bare-headed outdoors, be sure to use sunscreen on your scalp. Cut your hair short. It eases the inconvenience of shedding lots of hair, but it also can reduce the emotional impact of watching your hair fall out. Don't perm or color your hair during chemotherapy. Those chemical treatments are already damaging to hair and can enhance hair loss. Once your chemo treatments are done and your hair has grown back, it's OK to resume dyeing or perming hair.  With chemotherapy, hair loss is almost always temporary. But when it grows back, it may be a different color or texture. In older adults who still had hair color before chemotherapy, the new growth may be completely gray.  Often, new hair is very fine and soft.  Infection Prevention Please wash your hands for at least 30 seconds using warm soapy water . Handwashing is the #1 way to prevent the spread of germs. Stay away from sick people or people who are getting over a cold. If you develop respiratory systems such as green/yellow mucus production or productive cough or persistent cough let us  know and we will see if you need an antibiotic. It is a good idea to keep a pair of gloves on when going into grocery stores/[employer] to decrease your risk of coming into contact with germs on the carts, etc. Carry alcohol hand gel with you at all times and use it frequently if out in public. If your temperature reaches 100.4 or higher please call the clinic and let us  know.  If it is after hours or on the weekend please go to the ER if your temperature is over 100.4.  Please have your own personal thermometer at home to use.    Sex and bodily fluids If you are going to have sex, a condom must be used to protect the person that isn't taking chemotherapy. Chemo can decrease your libido (sex drive). For a few days after chemotherapy, chemotherapy can be excreted through  your bodily fluids.  When using the toilet please close the lid and flush the toilet twice.  Do this for a few day after you have had chemotherapy.   Effects of chemotherapy on your sex life Some changes are simple and won't last long. They won't affect your sex life permanently.  Sometimes you may feel: too tired not strong enough to be very active sick or sore  not in the mood anxious or low  Your anxiety might not seem related to sex. For example, you may be worried about the cancer and how your  treatment is going. Or you may be worried about money, or about how you family are coping with your illness.  These things can cause stress, which can affect your interest in sex. It's important to talk to your partner about how you feel.  Remember - the changes to your sex life don't usually last long. There's usually no medical reason to stop having sex during chemo. The drugs won't have any long term physical effects on your performance or enjoyment of sex. Cancer can't be passed on to your partner during sex  Contraception It's important to use reliable contraception during treatment. Avoid getting pregnant while you or your partner are having chemotherapy. This is because the drugs may harm the baby. Sometimes chemotherapy drugs can leave a man or woman infertile.  This means you would not be able to have children in the future. You might want to talk to someone about permanent infertility. It can be very difficult to learn that you may no longer be able to have children. Some people find counselling helpful. There might be ways to preserve your fertility, although this is easier for men than for women. You may want to speak to a fertility expert. You can talk about sperm banking or harvesting your eggs. You can also ask about other fertility options, such as donor eggs. If you have or have had breast cancer, your doctor might advise you not to take the contraceptive pill. This is because the hormones  in it might affect the cancer. It is not known for sure whether or not chemotherapy drugs can be passed on through semen or secretions from the vagina. Because of this some doctors advise people to use a barrier method if you have sex during treatment. This applies to vaginal, anal or oral sex. Generally, doctors advise a barrier method only for the time you are actually having the treatment and for about a week after your treatment. Advice like this can be worrying, but this does not mean that you have to avoid being intimate with your partner. You can still have close contact with your partner and continue to enjoy sex.  Animals If you have cats or birds we just ask that you not change the litter or change the cage.  Please have someone else do this for you while you are on chemotherapy.   Food Safety During and After Cancer Treatment Food safety is important for people both during and after cancer treatment. Cancer and cancer treatments, such as chemotherapy, radiation therapy, and stem cell/bone marrow transplantation, often weaken the immune system. This makes it harder for your body to protect itself from foodborne illness, also called food poisoning. Foodborne illness is caused by eating food that contains harmful bacteria, parasites, or viruses.  Foods to avoid Some foods have a higher risk of becoming tainted with bacteria. These include: Unwashed fresh fruit and vegetables, especially leafy vegetables that can hide dirt and other contaminants Raw sprouts, such as alfalfa sprouts Raw or undercooked beef, especially ground beef, or other raw or undercooked meat and poultry Fatty, fried, or spicy foods immediately before or after treatment.  These can sit heavy on your stomach and make you feel nauseous. Raw or undercooked shellfish, such as oysters. Sushi and sashimi, which often contain raw fish.  Unpasteurized beverages, such as unpasteurized fruit juices, raw milk, raw yogurt, or  cider Undercooked eggs, such as soft boiled, over easy, and poached; raw, unpasteurized eggs; or foods made with raw egg, such as homemade  raw cookie dough and homemade mayonnaise  Simple steps for food safety  Shop smart. Do not buy food stored or displayed in an unclean area. Do not buy bruised or damaged fruits or vegetables. Do not buy cans that have cracks, dents, or bulges. Pick up foods that can spoil at the end of your shopping trip and store them in a cooler on the way home.  Prepare and clean up foods carefully. Rinse all fresh fruits and vegetables under running water , and dry them with a clean towel or paper towel. Clean the top of cans before opening them. After preparing food, wash your hands for 20 seconds with hot water  and soap. Pay special attention to areas between fingers and under nails. Clean your utensils and dishes with hot water  and soap. Disinfect your kitchen and cutting boards using 1 teaspoon of liquid, unscented bleach mixed into 1 quart of water .    Dispose of old food. Eat canned and packaged food before its expiration date (the "use by" or "best before" date). Consume refrigerated leftovers within 3 to 4 days. After that time, throw out the food. Even if the food does not smell or look spoiled, it still may be unsafe. Some bacteria, such as Listeria, can grow even on foods stored in the refrigerator if they are kept for too long.  Take precautions when eating out. At restaurants, avoid buffets and salad bars where food sits out for a long time and comes in contact with many people. Food can become contaminated when someone with a virus, often a norovirus, or another "bug" handles it. Put any leftover food in a "to-go" container yourself, rather than having the server do it. And, refrigerate leftovers as soon as you get home. Choose restaurants that are clean and that are willing to prepare your food as you order it cooked.   AT HOME MEDICATIONS:                                                                                                                                                                 Compazine /Prochlorperazine  10mg  tablet. Take 1 tablet every 6 hours as needed for nausea/vomiting. (This can make you sleepy)   EMLA  cream. Apply a quarter size amount to port site 1 hour prior to chemo. Do not rub in. Cover with plastic wrap.    Diarrhea Sheet   If you are having loose stools/diarrhea, please purchase Imodium and begin taking as outlined:  At the first sign of poorly formed or loose stools you should begin taking Imodium (loperamide) 2 mg capsules.  Take two tablets (4mg ) followed by one tablet (2mg ) every 2 hours - DO NOT EXCEED 8 tablets in 24 hours.  If it is bedtime and you are having loose stools, take  2 tablets at bedtime, then 2 tablets every 4 hours until morning.   Always call the Cancer Center if you are having loose stools/diarrhea that you can't get under control.  Loose stools/diarrhea leads to dehydration (loss of water ) in your body.  We have other options of trying to get the loose stools/diarrhea to stop but you must let us  know!   Constipation Sheet  Colace - 100 mg capsules - take 2 capsules daily.  If this doesn't help then you can increase to 2 capsules twice daily.  Please call if the above does not work for you. Do not go more than 2 days without a bowel movement.  It is very important that you do not become constipated.  It will make you feel sick to your stomach (nausea) and can cause abdominal pain and vomiting.  Nausea Sheet   Compazine /Prochlorperazine  10mg  tablet. Take 1 tablet every 6 hours as needed for nausea/vomiting (This can make you drowsy).  If you are having persistent nausea (nausea that does not stop) please call the Cancer Center and let us  know the amount of nausea that you are experiencing.  If you begin to vomit, you need to call the Cancer Center and if it is the weekend and you have  vomited more than one time and can't get it to stop-go to the Emergency Room.  Persistent nausea/vomiting can lead to dehydration (loss of fluid in your body) and will make you feel very weak and unwell. Ice chips, sips of clear liquids, foods that are at room temperature, crackers, and toast tend to be better tolerated.   SYMPTOMS TO REPORT AS SOON AS POSSIBLE AFTER TREATMENT:  FEVER GREATER THAN 100.4 F  CHILLS WITH OR WITHOUT FEVER  NAUSEA AND VOMITING THAT IS NOT CONTROLLED WITH YOUR NAUSEA MEDICATION  UNUSUAL SHORTNESS OF BREATH  UNUSUAL BRUISING OR BLEEDING  TENDERNESS IN MOUTH AND THROAT WITH OR WITHOUT PRESENCE OF ULCERS  URINARY PROBLEMS  BOWEL PROBLEMS  UNUSUAL RASH      Wear comfortable clothing and clothing appropriate for easy access to any Portacath or PICC line. Let us  know if there is anything that we can do to make your therapy better!    What to do if you need assistance after hours or on the weekends: CALL [phone].  HOLD on the line, do not hang up.  You will hear multiple messages but at the end you will be connected with a nurse triage line.  They will contact the doctor if necessary.  Most of the time they will be able to assist you.  Do not call the hospital operator.      I have been informed and understand all of the instructions given to me and have received a copy. I have been instructed to call the clinic [phone] or my family physician as soon as possible for continued medical care, if indicated. I do not have any more questions at this time but understand that I may call the Cancer Center or the Patient Navigator at [phone] during office hours should I have questions or need assistance in obtaining follow-up care.

## 2023-12-14 NOTE — Progress Notes (Signed)

## 2023-12-15 ENCOUNTER — Ambulatory Visit (HOSPITAL_COMMUNITY): Admitting: Speech Pathology

## 2023-12-16 ENCOUNTER — Inpatient Hospital Stay: Admitting: Licensed Clinical Social Worker

## 2023-12-16 DIAGNOSIS — C109 Malignant neoplasm of oropharynx, unspecified: Secondary | ICD-10-CM

## 2023-12-18 NOTE — Progress Notes (Signed)
 [facility] Clinical Social Work  Initial Assessment   Jerome Washington is a 53 y.o. year old male, details for assessment provided by pt's friend, Chell. Clinical Social Work was referred by medical provider for assessment of psychosocial needs.   SDOH (Social Determinants of Health) assessments performed: Yes   SDOH Screenings   Food Insecurity: No Food Insecurity (12/15/2023)   Received from [facility]  Housing: Low Risk  (11/18/2023)  Transportation Needs: No Transportation Needs (12/15/2023)   Received from [facility]  Utilities: Low Risk (12/15/2023)   Received from [facility]  Depression [phone]): Low Risk  (12/09/2023)  Tobacco Use: Medium Risk (12/15/2023)   Received from [facility]    PHQ 2/9:    12/09/2023    1:41 PM  Depression screen PHQ 2/9  Decreased Interest 0  Down, Depressed, Hopeless 0  PHQ - 2 Score 0     Distress Screen completed: Yes    12/14/2023   11:31 AM  ONCBCN DISTRESS SCREENING  Screening Type Initial Screening  How much distress have you been experiencing in the past week? (0-10) 4      Family/Social Information:  Housing Arrangement: patient lives with his mother.  Family members/support persons in your life? Per Chell pt's mother assists pt and pt has a HHA through his Medicaid benefits for 3 hrs/day.  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  Pt also has 2 adult children who reportedly reside locally. Transportation concerns: no  Employment: Unemployed .  Income source: Special Educational Needs Teacher Income Financial concerns: No Type of concern: None Food access concerns: no Religious or spiritual practice: Not known Advanced directives: Not known Services Currently in place:  pt receives HHA services 3 hrs/ day  Coping/ Adjustment to diagnosis: Patient understands treatment plan and what happens next? yes Concerns about diagnosis and/or treatment: Overwhelmed by information,  Afraid of cancer, and Quality of life Patient reported stressors: Anxiety/ nervousness and Adjusting to my illness Hopes and/or priorities: pt's priority is to start treatment w/ the hope of positive results. Patient enjoys not addressed Current coping skills/ strengths: Motivation for treatment/growth  and Supportive family/friends     SUMMARY: Current SDOH Barriers:  No barriers identified at this time.  Clinical Social Work Clinical Goal(s):  No clinical social work goals at this time  Interventions: Discussed common feeling and emotions when being diagnosed with cancer, and the importance of support during treatment Informed patient of the support team roles and support services at [facility] Provided CSW contact information and encouraged patient to call with any questions or concerns Pt's friend informed of the Mable Ferretti should financial concerns arise.  Pt reportedly does receive SNAP benefits and would be eligible.  Pt referred to Wadie Rung for additional support.    Follow Up Plan: Patient will contact CSW with any support or resource needs Patient verbalizes understanding of plan: Yes    Devere JONELLE Manna, LCSW Clinical Social Worker  Cancer Center  Patient is participating in a Managed Medicaid Plan:  Yes

## 2023-12-21 ENCOUNTER — Inpatient Hospital Stay

## 2023-12-22 ENCOUNTER — Other Ambulatory Visit: Payer: Self-pay | Admitting: Oncology

## 2023-12-22 ENCOUNTER — Ambulatory Visit (HOSPITAL_COMMUNITY): Admission: RE | Admit: 2023-12-22 | Source: Ambulatory Visit

## 2023-12-22 DIAGNOSIS — C109 Malignant neoplasm of oropharynx, unspecified: Secondary | ICD-10-CM

## 2023-12-22 NOTE — Progress Notes (Signed)
 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

## 2023-12-23 ENCOUNTER — Other Ambulatory Visit: Payer: Self-pay

## 2023-12-26 ENCOUNTER — Ambulatory Visit (HOSPITAL_COMMUNITY): Admission: RE | Admit: 2023-12-26

## 2023-12-28 ENCOUNTER — Other Ambulatory Visit: Payer: Self-pay | Admitting: *Deleted

## 2023-12-28 ENCOUNTER — Inpatient Hospital Stay

## 2023-12-28 ENCOUNTER — Inpatient Hospital Stay: Attending: Oncology | Admitting: Dietician

## 2023-12-28 VITALS — BP 122/76 | HR 98 | Temp 97.2°F | Resp 18

## 2023-12-28 DIAGNOSIS — F17200 Nicotine dependence, unspecified, uncomplicated: Secondary | ICD-10-CM | POA: Insufficient documentation

## 2023-12-28 DIAGNOSIS — C109 Malignant neoplasm of oropharynx, unspecified: Secondary | ICD-10-CM

## 2023-12-28 DIAGNOSIS — E611 Iron deficiency: Secondary | ICD-10-CM | POA: Diagnosis not present

## 2023-12-28 DIAGNOSIS — Z5111 Encounter for antineoplastic chemotherapy: Secondary | ICD-10-CM | POA: Diagnosis present

## 2023-12-28 LAB — CBC WITH DIFFERENTIAL/PLATELET
Abs Immature Granulocytes: 0.02 K/uL (ref 0.00–0.07)
Basophils Absolute: 0 K/uL (ref 0.0–0.1)
Basophils Relative: 0 %
Eosinophils Absolute: 0.1 K/uL (ref 0.0–0.5)
Eosinophils Relative: 1 %
HCT: 34 % — ABNORMAL LOW (ref 39.0–52.0)
Hemoglobin: 11.1 g/dL — ABNORMAL LOW (ref 13.0–17.0)
Immature Granulocytes: 0 %
Lymphocytes Relative: 15 %
Lymphs Abs: 1.4 K/uL (ref 0.7–4.0)
MCH: 32.1 pg (ref 26.0–34.0)
MCHC: 32.6 g/dL (ref 30.0–36.0)
MCV: 98.3 fL (ref 80.0–100.0)
Monocytes Absolute: 1 K/uL (ref 0.1–1.0)
Monocytes Relative: 11 %
Neutro Abs: 6.8 K/uL (ref 1.7–7.7)
Neutrophils Relative %: 73 %
Platelets: 498 K/uL — ABNORMAL HIGH (ref 150–400)
RBC: 3.46 MIL/uL — ABNORMAL LOW (ref 4.22–5.81)
RDW: 11.7 % (ref 11.5–15.5)
WBC: 9.3 K/uL (ref 4.0–10.5)
nRBC: 0 % (ref 0.0–0.2)

## 2023-12-28 LAB — MAGNESIUM: Magnesium: 2 mg/dL (ref 1.7–2.4)

## 2023-12-28 LAB — BASIC METABOLIC PANEL WITH GFR
Anion gap: 13 (ref 5–15)
BUN: 7 mg/dL (ref 6–20)
CO2: 25 mmol/L (ref 22–32)
Calcium: 10.2 mg/dL (ref 8.9–10.3)
Chloride: 97 mmol/L — ABNORMAL LOW (ref 98–111)
Creatinine, Ser: 0.45 mg/dL — ABNORMAL LOW (ref 0.61–1.24)
GFR, Estimated: >60 mL/min (ref >60)
Glucose, Bld: 98 mg/dL (ref 70–99)
Potassium: 4.2 mmol/L (ref 3.5–5.1)
Sodium: 135 mmol/L (ref 135–145)

## 2023-12-28 LAB — HEPATIC FUNCTION PANEL
ALT: 6 U/L (ref 0–44)
AST: 17 U/L (ref 15–41)
Albumin: 3.9 g/dL (ref 3.5–5.0)
Alkaline Phosphatase: 96 U/L (ref 38–126)
Bilirubin, Direct: 0.2 mg/dL (ref 0.0–0.2)
Indirect Bilirubin: 0.2 mg/dL — ABNORMAL LOW (ref 0.3–0.9)
Total Bilirubin: 0.4 mg/dL (ref 0.0–1.2)
Total Protein: 7.2 g/dL (ref 6.5–8.1)

## 2023-12-28 MED ORDER — ACETAMINOPHEN 325 MG PO TABS
650.0000 mg | ORAL_TABLET | Freq: Once | ORAL | Status: AC
  Administered 2023-12-28: 650 mg via ORAL
  Filled 2023-12-28: qty 2

## 2023-12-28 MED ORDER — PALONOSETRON HCL INJECTION 0.25 MG/5ML
0.2500 mg | Freq: Once | INTRAVENOUS | Status: AC
  Administered 2023-12-28: 0.25 mg via INTRAVENOUS
  Filled 2023-12-28: qty 5

## 2023-12-28 MED ORDER — SODIUM CHLORIDE 0.9 % IV SOLN
40.0000 mg/m2 | Freq: Once | INTRAVENOUS | Status: AC
  Administered 2023-12-28: 65 mg via INTRAVENOUS
  Filled 2023-12-28: qty 65

## 2023-12-28 MED ORDER — DEXAMETHASONE 4 MG PO TABS: ORAL_TABLET | ORAL | 1 refills | Status: AC

## 2023-12-28 MED ORDER — SODIUM CHLORIDE 0.9 % IV SOLN
150.0000 mg | Freq: Once | INTRAVENOUS | Status: AC
  Administered 2023-12-28: 150 mg via INTRAVENOUS
  Filled 2023-12-28: qty 150

## 2023-12-28 MED ORDER — DEXAMETHASONE SOD PHOSPHATE PF 10 MG/ML IJ SOLN
10.0000 mg | Freq: Once | INTRAMUSCULAR | Status: AC
  Administered 2023-12-28: 10 mg via INTRAVENOUS

## 2023-12-28 MED ORDER — ONDANSETRON HCL 8 MG PO TABS: 8.0000 mg | ORAL_TABLET | Freq: Three times a day (TID) | ORAL | 1 refills | Status: AC | PRN

## 2023-12-28 MED ORDER — MAGNESIUM SULFATE 2 GM/50ML IV SOLN
2.0000 g | Freq: Once | INTRAVENOUS | Status: AC
  Administered 2023-12-28: 2 g via INTRAVENOUS
  Filled 2023-12-28: qty 50

## 2023-12-28 MED ORDER — PROCHLORPERAZINE MALEATE 10 MG PO TABS: 10.0000 mg | ORAL_TABLET | Freq: Four times a day (QID) | ORAL | 1 refills | Status: AC | PRN

## 2023-12-28 MED ORDER — SODIUM CHLORIDE 0.9 % IV SOLN: INTRAVENOUS | Status: DC

## 2023-12-28 MED ORDER — POTASSIUM CHLORIDE IN NACL 20-0.9 MEQ/L-% IV SOLN
Freq: Once | INTRAVENOUS | Status: AC
  Filled 2023-12-28: qty 1000

## 2023-12-28 MED ORDER — SODIUM CHLORIDE 0.9 % IV SOLN: Freq: Once | INTRAVENOUS | Status: AC

## 2023-12-28 MED ORDER — LIDOCAINE-PRILOCAINE 2.5-2.5 % EX CREA: TOPICAL_CREAM | CUTANEOUS | 3 refills | Status: AC

## 2023-12-28 NOTE — Progress Notes (Unsigned)
 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 He is receiving concurrent chemoradiation with weekly cisplatin . PEG and trach in place 11/17/23    Nutrition Focused Physical Exam:   Orbital Region: *** Buccal Region: *** Upper Arm Region: *** Thoracic and Lumbar Region: *** Temple Region: *** Clavicle Bone Region: *** Shoulder and Acromion Bone Region: *** Scapular Bone Region: *** Dorsal Hand: *** Patellar Region: *** Anterior Thigh Region: *** Posterior Calf Region: *** Edema (RD assessment): *** Hair: *** Eyes: *** Mouth: *** Skin: *** Nails: ***   Medications: colace, ferrous sulfate, hydroxyzine , MVI, zofran , miralax , prednisone , compazine , trazodone   Labs: Cr 0.45   Anthropometrics:   Height: 5'11 Weight: 114 lb  UBW: *** BMI: 15.90 (underwt)    Estimated Energy Needs  Kcals: *** Protein: *** Fluid: ***   NUTRITION DIAGNOSIS: ***   MALNUTRITION DIAGNOSIS: ***   INTERVENTION: ***   MONITORING, EVALUATION, GOAL: ***   Next Visit: ***

## 2023-12-28 NOTE — Progress Notes (Signed)
 Patient presents today for D1C1 Cisplatin  infusion per providers order.  Vital signs and labs within parameters for treatment.  Patient has no new complaints at this time.

## 2023-12-28 NOTE — Progress Notes (Signed)
 Pharmacist Chemotherapy Monitoring - Initial Assessment    Anticipated start date: 12/28/23   The following has been reviewed per standard work regarding the patient's treatment regimen: The patient's diagnosis, treatment plan and drug doses, and organ/hematologic function Lab orders and baseline tests specific to treatment regimen  The treatment plan start date, drug sequencing, and pre-medications Prior authorization status  Patient's documented medication list, including drug-drug interaction screen and prescriptions for anti-emetics and supportive care specific to the treatment regimen The drug concentrations, fluid compatibility, administration routes, and timing of the medications to be used The patient's access for treatment and lifetime cumulative dose history, if applicable  The patient's medication allergies and previous infusion related reactions, if applicable   Changes made to treatment plan:  Adjusted pre hydration to 500 ml/hr x 2 hours and then added 500 ml NS over 1 hour post cisplatin  to plan per MD Instructions.  Follow up needed:  N/A   Jerome Washington, [facility], 12/28/2023  8:37 AM

## 2023-12-28 NOTE — Patient Instructions (Signed)
 Referral placed for Home Health for Nursing, speech and RT to assist with maintenance of trach and tube feedings.

## 2023-12-28 NOTE — Progress Notes (Signed)
 Patient tolerated chemotherapy with no complaints voiced.  Side effects with management reviewed with understanding verbalized.  Port site clean and dry with no bruising or swelling noted at site.  Good blood return noted before and after administration of chemotherapy.  Band aid applied.  Patient left in satisfactory condition with VSS and no s/s of distress noted. All follow ups as scheduled.   Venkat Ankney [employer]

## 2023-12-29 ENCOUNTER — Other Ambulatory Visit: Payer: Self-pay | Admitting: Oncology

## 2023-12-29 ENCOUNTER — Telehealth: Payer: Self-pay

## 2023-12-29 DIAGNOSIS — C109 Malignant neoplasm of oropharynx, unspecified: Secondary | ICD-10-CM

## 2023-12-29 NOTE — Telephone Encounter (Signed)
 Chemotherapy 24 hour follow up call.  Patient stated he felt good and like his breathing was better.  Denied nausea, diarrhea, or SOB.  Continues to have prn headache and Tylenol  helps to manage it.  Reminded of telephone numbers with understanding verbalized.

## 2023-12-30 ENCOUNTER — Encounter (HOSPITAL_COMMUNITY): Payer: Self-pay | Admitting: Speech Pathology

## 2023-12-30 ENCOUNTER — Telehealth (HOSPITAL_COMMUNITY): Payer: Self-pay | Admitting: Speech Pathology

## 2023-12-30 ENCOUNTER — Other Ambulatory Visit: Payer: Self-pay

## 2023-12-30 ENCOUNTER — Ambulatory Visit (HOSPITAL_COMMUNITY): Admitting: Speech Pathology

## 2023-12-30 ENCOUNTER — Encounter: Payer: Self-pay | Admitting: *Deleted

## 2023-12-30 NOTE — Progress Notes (Signed)
 Referral placed for [facility] for trach care and management of tube feedings.  Denial received from [facility] and [facility].  Message left for [place] with [facility] to inquire as to if they can accommodate this patient's needs.

## 2023-12-30 NOTE — Telephone Encounter (Signed)
 Speech Pathology  Telephone call placed to Pt due to no show for this morning's 9AM SLP evaluation. SLP also noted that Dr. Mickiel Dry placed a referral for home health SLP services on 12/28/2023. Pt cannot receive home health SLP services and outpatient SLP services. SLP spoke with Pt today and he apologized for missing appointment and stated he has a radiation appointment today and cannot come. He does not know if a HH SLP is coming to his house. Pt reports eating liquids and very soft textures and tolerating well with PMSV in place. Pt will find out if he is receiving HH SLP.  Thank you,  Lamar Candy, CCC-SLP [phone]

## 2024-01-01 ENCOUNTER — Encounter (HOSPITAL_COMMUNITY): Payer: Self-pay

## 2024-01-03 NOTE — Progress Notes (Unsigned)
 Patient Care Team: [name], [place] Cesar, NP as PCP - General Davonna Siad, MD as Medical Oncologist (Medical Oncology) Celestia Joesph SQUIBB, RN as Oncology Nurse Navigator (Medical Oncology)  Clinic Day:  01/03/2024  Referring physician: Benjamin [name], NP   CHIEF COMPLAINT:  CC: Recurrent oropharyngeal cancer    ASSESSMENT & PLAN:   Assessment & Plan: Jerome Washington  is a 53 y.o. 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 Likely T4N1M0 disease Discussed by ENT at [facility] and recommendation for oropharyngectomy with free flap and possible laryngectomy with post-CRT v/s CRT. Patient chose chemo RT. Started chemo RT with cisplatin  on 12/28/2023   - Patient is tolerating chemotherapy and radiation well.  Reports some tinnitus as the only side effect. -[facility] NGS showed PD-L1 positive, CPS: 5 - Labs reviewed today: CMP: Normal creatinine and LFTs.  CBC: Hemoglobin: 10.9, platelets: 410.  Physical exam stable today.  Will proceed with chemotherapy today.  Return to clinic with next cycle of chemotherapy  Severe iron deficiency Severe iron deficiency likely secondary to nutritional deficiency TSAT: 6, ferritin: 147  - Considering patient has a J-tube, will prefer IV iron at this time. - Discussed side effects of IV iron including allergic reactions, nausea, headache and skin discoloration - Will schedule for IV iron infusion - Will recheck levels in 2 months.   Dysphagia and weight loss secondary to oropharyngeal cancer Dysphagia and weight loss improved with PEG tube placement. Patient has some improved weight gain at this time.   - Continue nutritional support via PEG tube.   Insomnia and headache Persistent insomnia and headaches, possibly cancer or treatment-related. Ibuprofen  suggested for headache relief. - Continue ibuprofen  400 mg for  headache relief. - Patient missed his previously scheduled MRI of brain.  Will get this rescheduled.  Tobacco use Patient is a former smoker and quit smoking recently.   -Encouraged abstinence from smoking.  Anemia and thrombocytosis Likely secondary to iron deficiency  - Will continue to monitor   The patient understands the plans discussed today and is in agreement with them.  He knows to contact our office if he develops concerns prior to his next appointment.  The total time spent in the appointment was 30 minutes for the encounter with patient, including review of chart and various tests results, discussions about plan of care and coordination of care plan   Siad Davonna, MD  [facility] [place] - A DEPT OF [facility] [address] [place] [address] Dept: [phone] Dept Fax: [phone]   No orders of the defined types were placed in this encounter.    ONCOLOGY HISTORY:   Diagnosis: Recurrent oropharyngeal carcinoma   -10/22/2018: Floor of mouth, biopsy: Invasive squamous cell carcinoma, well-differentiated, keratinizing type.  The tumor has a thickness of at least 4 mm and involves the specimen base. 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  All surgical margins are negative for tumor or high-grade dysplasia.  No lymph node involvement.  Rest of the areas negative for carcinoma. -11/17/2023: Oropharynx excision: -Squamous cell carcinoma, moderately differentiated.  - Immunohistochemical stain for p16 is negative  -12/03/2023: PET scan: Large hypermetabolic mass involving the posterior oropharynx with metastatic disease involving the right posterolateral retropharyngeal space consistent with recurrent malignancy. No suspicious finding to suggest new distant metastasis within the  abdomen  and pelvis. -12/11/2023: IR guided port insertion -12/28/2023- Current: Weekly cisplatin  40 mg/m -12/28/2023- Current: RT to the oropharyngeal mass. - 12/11/ 2025: [facility] NGS: PD-L1: Positive, CPS: 5, TP 53: Positive  - NTRK 1/2/3, RET, ERBB2, CDKN2a: Negative  - MSI-stable, TMB: Low, 5 mut/Mb.    Current Treatment:  CRT with cisplatin   INTERVAL HISTORY:   Discussed the use of AI scribe software for clinical note transcription with the patient, who gave verbal consent to proceed.  History of Present Illness Jerome Washington is a 53 year old male with head and neck cancer undergoing concurrent chemoradiation who presents for cycle two of chemotherapy and evaluation of treatment-related symptoms.  He is currently receiving concurrent radiation therapy and chemotherapy for head and neck cancer, with his second cycle of chemotherapy administered at this visit and a radiation session completed earlier today. He reports feeling generally unwell but otherwise stable, with mild, intermittent tinnitus as his only notable chemotherapy-related symptom. He denies nausea, vomiting, or worsening neuropathic symptoms.  He recalls being told that his iron levels were very low and that intravenous iron infusions were planned, but he has not yet received them. He continues to rely primarily on his gastrostomy tube for nutrition, though he is able to tolerate some oral intake.  He experiences intermittent headaches, which he attributes to the tightness of the radiation mask and possibly to his medications. Ibuprofen  provides partial relief. He denies persistent or worsening headaches and has no associated neurological symptoms such as extremity numbness or tingling. He recalls that an MRI of the head was previously discussed but he missed that appointment.    He denies ongoing tobacco use, though he has a history of smoking.    I have reviewed the past medical history, past surgical history, social history  and family history with the patient and they are unchanged from previous note.  ALLERGIES:  is allergic to bee venom, morphine  and codeine , and penicillins.  MEDICATIONS:  Current Outpatient Medications  Medication Sig Dispense Refill   acetaminophen  (TYLENOL ) 500 MG tablet Crush and place 1 tablet (500 mg total) into feeding tube every 8 (eight) hours as needed as directed. 20 tablet 0   albuterol  (PROVENTIL ) (2.5 MG/3ML) 0.083% nebulizer solution Take 3 mLs (2.5 mg total) by nebulization every 2 (two) hours as needed for wheezing or shortness of breath. 90 mL 12   dexamethasone  (DECADRON ) 4 MG tablet Take 2 tablets (8 mg) by mouth daily x 3 days starting the day after cisplatin  chemotherapy. Take with food. 30 tablet 1   docusate (COLACE) 50 MG/5ML liquid Place 10 mLs (100 mg total) into feeding tube 2 (two) times daily as needed for mild constipation. 100 mL 0   ferrous sulfate 324 MG TBEC Take 324 mg by mouth.     hydrOXYzine  (VISTARIL ) 25 MG capsule Take 1 capsule (25 mg total) by mouth at bedtime as needed for anxiety. 30 capsule 0   lidocaine -prilocaine  (EMLA ) cream Apply 1 Application topically as needed. 30 g 1   lidocaine -prilocaine  (EMLA ) cream Apply to affected area once 30 g 3   Multiple Vitamin (MULTIVITAMIN WITH MINERALS) TABS tablet Crush and place 1 tablet into feeding tube daily as directed. 30 tablet 0   Nutritional Supplements (FEEDING SUPPLEMENT, OSMOLITE 1.5 CAL,) LIQD Place 1,000 mLs into feeding tube continuous. 35cc/hr     ondansetron  (ZOFRAN ) 8 MG tablet Take 1 tablet (8 mg total) by mouth every 8 (eight) hours as needed for nausea or vomiting. Start on the  third day after cisplatin . 30 tablet 1   polyethylene glycol powder (GLYCOLAX /MIRALAX ) 17 GM/SCOOP powder Take 17 g by mouth daily as needed for moderate constipation. Dissolve 1 capful (17g) in 4-8 ounces of liquid and take by mouth daily. (Patient not taking: Reported on 12/09/2023) 238 g 0   predniSONE  (DELTASONE )  20 MG tablet TAKE 1 TABLET BY MOUTH ONCE DAILY WITH BREAKFAST FOR 5 DAYS     prochlorperazine  (COMPAZINE ) 10 MG tablet Take 1 tablet (10 mg total) by mouth every 6 (six) hours as needed for nausea or vomiting. 30 tablet 0   prochlorperazine  (COMPAZINE ) 10 MG tablet Take 1 tablet (10 mg total) by mouth every 6 (six) hours as needed (Nausea or vomiting). 30 tablet 1   Protein (FEEDING SUPPLEMENT, PROSOURCE TF20,) liquid Place 60 mLs into feeding tube 2 (two) times daily. (Patient not taking: Reported on 12/09/2023) 60 mL 5   traZODone (DESYREL) 50 MG tablet Take 50 mg by mouth daily.     Water  For Irrigation, Sterile (FREE WATER ) SOLN Place 200 mLs into feeding tube every 8 (eight) hours.     No current facility-administered medications for this visit.     VITALS:  There were no vitals taken for this visit.  Wt Readings from Last 3 Encounters:  12/28/23 114 lb (51.7 kg)  12/11/23 115 lb (52.2 kg)  12/09/23 115 lb (52.2 kg)    There is no height or weight on file to calculate BMI.  Performance status (ECOG): 0 - Asymptomatic  PHYSICAL EXAM:   GENERAL:alert, no distress and comfortable OROPHARYNX:Large oropharyngeal mass palpated. No lymphadenopathy palpated.  Tracheostomy tube in place.  NECK: supple, thyroid normal size, non-tender, without nodularity LYMPH:  no palpable lymphadenopathy in the cervical, axillary or inguinal LUNGS: clear to auscultation and percussion with normal breathing effort HEART: regular rate & rhythm and no murmurs and no lower extremity edema ABDOMEN:abdomen soft, non-tender and normal bowel sounds, G-tube in place NEURO: alert & oriented x 3 with fluent speech  LABORATORY DATA:  I have reviewed the data as listed     Component Value Date/Time   NA 135 12/28/2023 0933   K 4.2 12/28/2023 0933   CL 97 (L) 12/28/2023 0933   CO2 25 12/28/2023 0933   GLUCOSE 98 12/28/2023 0933   BUN 7 12/28/2023 0933   CREATININE 0.45 (L) 12/28/2023 0933   CALCIUM 10.2  12/28/2023 0933   PROT 7.2 12/28/2023 0933   ALBUMIN 3.9 12/28/2023 0933   AST 17 12/28/2023 0933   ALT 6 12/28/2023 0933   ALKPHOS 96 12/28/2023 0933   BILITOT 0.4 12/28/2023 0933   GFRNONAA >60 12/28/2023 0933   GFRAA >60 07/23/2015 2130     Lab Results  Component Value Date   WBC 9.3 12/28/2023   NEUTROABS 6.8 12/28/2023   HGB 11.1 (L) 12/28/2023   HCT 34.0 (L) 12/28/2023   MCV 98.3 12/28/2023   PLT 498 (H) 12/28/2023      Chemistry      Component Value Date/Time   NA 135 12/28/2023 0933   K 4.2 12/28/2023 0933   CL 97 (L) 12/28/2023 0933   CO2 25 12/28/2023 0933   BUN 7 12/28/2023 0933   CREATININE 0.45 (L) 12/28/2023 0933      Component Value Date/Time   CALCIUM 10.2 12/28/2023 0933   ALKPHOS 96 12/28/2023 0933   AST 17 12/28/2023 0933   ALT 6 12/28/2023 0933   BILITOT 0.4 12/28/2023 0933  Latest Reference Range & Units 12/09/23 14:16  Iron 45 - 182 ug/dL 19 (L)  UIBC ug/dL 713  TIBC 749 - 549 ug/dL 694  Saturation Ratios 17.9 - 39.5 % 6 (L)  Ferritin 24 - 336 ng/mL 147  Folate >5.9 ng/mL 19.5  Vitamin B12 180 - 914 pg/mL 643  (L): Data is abnormally low  RADIOGRAPHIC STUDIES: I have personally reviewed the radiological images as listed and agreed with the findings in the report.  IR IMAGING GUIDED PORT INSERTION INDICATION: chemotherapy administration.  History of head and neck cancer  EXAM: IMPLANTED PORT A CATH PLACEMENT WITH ULTRASOUND AND FLUOROSCOPIC GUIDANCE  MEDICATIONS: Vancomycin  1 gm IV The antibiotic was administered within an appropriate time interval prior to skin puncture.  ANESTHESIA/SEDATION: Moderate (conscious) sedation was employed during this procedure. A total of Versed  1.5 mg and Fentanyl  75 mcg was administered intravenously.  Moderate Sedation Time: 21 minutes. The patient's level of consciousness and vital signs were monitored continuously by radiology nursing throughout the procedure under my  direct supervision.  FLUOROSCOPY: Radiation Exposure Index and estimated peak skin dose (PSD);  Reference air kerma (RAK), 0.2 mGy.  COMPLICATIONS: None immediate.  PROCEDURE: The procedure, risks, benefits, and alternatives were explained to the patient. Questions regarding the procedure were encouraged and answered. The patient understands and consents to the procedure.  The RIGHT neck and chest were prepped with chlorhexidine  in a sterile fashion, and a sterile drape was applied covering the operative field. Maximum barrier sterile technique with sterile gowns and gloves were used for the procedure. A timeout was performed prior to the initiation of the procedure. Local anesthesia was provided with 1% lidocaine  with epinephrine .  After creating a small venotomy incision, a micropuncture kit was utilized to access the internal jugular vein under direct, real-time ultrasound guidance. Ultrasound image documentation was performed. The microwire was kinked to measure appropriate catheter length.  A subcutaneous port pocket was then created along the upper chest wall utilizing a combination of sharp and blunt dissection. The pocket was irrigated with sterile saline. A single lumen power injectable port was chosen for placement. The 8 Fr catheter was tunneled from the port pocket site to the venotomy incision. The port was placed in the pocket. The external catheter was trimmed to appropriate length. At the venotomy, an 8 Fr peel-away sheath was placed over a guidewire under fluoroscopic guidance. The catheter was then placed through the sheath and the sheath was removed. Final catheter positioning was confirmed and documented with a fluoroscopic spot radiograph. The port was accessed with a Huber needle, aspirated and flushed with heparinized saline.  The port pocket incision was closed with interrupted 3-0 Vicryl suture then Dermabond was applied, including at the  venotomy incision. Dressings were placed. The patient tolerated the procedure well without immediate post procedural complication.  IMPRESSION: Successful placement of a RIGHT internal jugular approach power injectable Port-A-Cath.  The tip of the catheter is positioned within the proximal RIGHT atrium. The catheter is ready for immediate use.  Thom Hall, MD  [facility]  [facility]  Electronically Signed   By: Thom Hall M.D.   On: 12/11/2023 14:40

## 2024-01-04 ENCOUNTER — Inpatient Hospital Stay: Admitting: Oncology

## 2024-01-04 ENCOUNTER — Encounter: Payer: Self-pay | Admitting: Oncology

## 2024-01-04 ENCOUNTER — Inpatient Hospital Stay

## 2024-01-04 ENCOUNTER — Inpatient Hospital Stay: Admitting: Dietician

## 2024-01-04 VITALS — BP 127/89 | HR 80 | Temp 97.9°F | Resp 18 | Ht 70.5 in | Wt 117.5 lb

## 2024-01-04 VITALS — BP 130/72 | HR 85 | Temp 98.0°F | Resp 18

## 2024-01-04 DIAGNOSIS — D508 Other iron deficiency anemias: Secondary | ICD-10-CM | POA: Diagnosis not present

## 2024-01-04 DIAGNOSIS — D75839 Thrombocytosis, unspecified: Secondary | ICD-10-CM

## 2024-01-04 DIAGNOSIS — R634 Abnormal weight loss: Secondary | ICD-10-CM | POA: Diagnosis not present

## 2024-01-04 DIAGNOSIS — C109 Malignant neoplasm of oropharynx, unspecified: Secondary | ICD-10-CM | POA: Diagnosis not present

## 2024-01-04 DIAGNOSIS — G4489 Other headache syndrome: Secondary | ICD-10-CM

## 2024-01-04 DIAGNOSIS — Z5111 Encounter for antineoplastic chemotherapy: Secondary | ICD-10-CM | POA: Diagnosis not present

## 2024-01-04 LAB — COMPREHENSIVE METABOLIC PANEL WITH GFR
ALT: 12 U/L (ref 0–44)
AST: 16 U/L (ref 15–41)
Albumin: 3.9 g/dL (ref 3.5–5.0)
Alkaline Phosphatase: 89 U/L (ref 38–126)
Anion gap: 12 (ref 5–15)
BUN: 15 mg/dL (ref 6–20)
CO2: 27 mmol/L (ref 22–32)
Calcium: 9.2 mg/dL (ref 8.9–10.3)
Chloride: 98 mmol/L (ref 98–111)
Creatinine, Ser: 0.39 mg/dL — ABNORMAL LOW (ref 0.61–1.24)
GFR, Estimated: >60 mL/min (ref >60)
Glucose, Bld: 113 mg/dL — ABNORMAL HIGH (ref 70–99)
Potassium: 4.5 mmol/L (ref 3.5–5.1)
Sodium: 136 mmol/L (ref 135–145)
Total Bilirubin: 0.4 mg/dL (ref 0.0–1.2)
Total Protein: 6.8 g/dL (ref 6.5–8.1)

## 2024-01-04 LAB — CBC WITH DIFFERENTIAL/PLATELET
Abs Immature Granulocytes: 0.07 K/uL (ref 0.00–0.07)
Basophils Absolute: 0 K/uL (ref 0.0–0.1)
Basophils Relative: 0 %
Eosinophils Absolute: 0.1 K/uL (ref 0.0–0.5)
Eosinophils Relative: 1 %
HCT: 33.6 % — ABNORMAL LOW (ref 39.0–52.0)
Hemoglobin: 10.9 g/dL — ABNORMAL LOW (ref 13.0–17.0)
Immature Granulocytes: 1 %
Lymphocytes Relative: 6 %
Lymphs Abs: 0.7 K/uL (ref 0.7–4.0)
MCH: 31.9 pg (ref 26.0–34.0)
MCHC: 32.4 g/dL (ref 30.0–36.0)
MCV: 98.2 fL (ref 80.0–100.0)
Monocytes Absolute: 1.2 K/uL — ABNORMAL HIGH (ref 0.1–1.0)
Monocytes Relative: 10 %
Neutro Abs: 9.9 K/uL — ABNORMAL HIGH (ref 1.7–7.7)
Neutrophils Relative %: 82 %
Platelets: 410 K/uL — ABNORMAL HIGH (ref 150–400)
RBC: 3.42 MIL/uL — ABNORMAL LOW (ref 4.22–5.81)
RDW: 11.8 % (ref 11.5–15.5)
WBC: 11.9 K/uL — ABNORMAL HIGH (ref 4.0–10.5)
nRBC: 0 % (ref 0.0–0.2)

## 2024-01-04 LAB — MAGNESIUM: Magnesium: 2.1 mg/dL (ref 1.7–2.4)

## 2024-01-04 MED ORDER — MAGNESIUM SULFATE 2 GM/50ML IV SOLN
2.0000 g | Freq: Once | INTRAVENOUS | Status: AC
  Administered 2024-01-04: 11:00:00 2 g via INTRAVENOUS
  Filled 2024-01-04: qty 50

## 2024-01-04 MED ORDER — SODIUM CHLORIDE 0.9 % IV SOLN
40.0000 mg/m2 | Freq: Once | INTRAVENOUS | Status: AC
  Administered 2024-01-04: 12:00:00 65 mg via INTRAVENOUS
  Filled 2024-01-04: qty 65

## 2024-01-04 MED ORDER — SODIUM CHLORIDE 0.9 % IV SOLN
150.0000 mg | Freq: Once | INTRAVENOUS | Status: AC
  Administered 2024-01-04: 11:00:00 150 mg via INTRAVENOUS
  Filled 2024-01-04: qty 150

## 2024-01-04 MED ORDER — DEXAMETHASONE SOD PHOSPHATE PF 10 MG/ML IJ SOLN
10.0000 mg | Freq: Once | INTRAMUSCULAR | Status: AC
  Administered 2024-01-04: 11:00:00 10 mg via INTRAVENOUS

## 2024-01-04 MED ORDER — SODIUM CHLORIDE 0.9 % IV SOLN: Freq: Once | INTRAVENOUS | Status: AC

## 2024-01-04 MED ORDER — SODIUM CHLORIDE 0.9 % IV SOLN: INTRAVENOUS | Status: DC

## 2024-01-04 MED ORDER — PALONOSETRON HCL INJECTION 0.25 MG/5ML
0.2500 mg | Freq: Once | INTRAVENOUS | Status: AC
  Administered 2024-01-04: 11:00:00 0.25 mg via INTRAVENOUS
  Filled 2024-01-04: qty 5

## 2024-01-04 MED ORDER — ACETAMINOPHEN 325 MG PO TABS
650.0000 mg | ORAL_TABLET | Freq: Once | ORAL | Status: AC
  Administered 2024-01-04: 11:00:00 650 mg via ORAL
  Filled 2024-01-04: qty 2

## 2024-01-04 MED ORDER — POTASSIUM CHLORIDE IN NACL 20-0.9 MEQ/L-% IV SOLN
Freq: Once | INTRAVENOUS | Status: AC
  Filled 2024-01-04: qty 1000

## 2024-01-04 NOTE — Progress Notes (Signed)
 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 He is receiving concurrent chemoradiation with weekly cisplatin  (start 12/8). S/p PEG and trach 11/17/23   Met with patient in infusion. He is doing well today. Reports tolerating concurrent therapy with out side effects thus far. Patient has been established with home health. Reports SLP to come this week for assessment. Patient continues eating soft moist textures (soft baked cookies, creme pie, bread without crust). He has increased continuous feeds at home. Tolerating Nutren 2.0 @ 45 ml/hr. Patient giving 4 cartons/day. He is flushing tube and providing daily PEG care. Patient has brought back pack for RD assistance in securing pump and bag.   Medications: reviewed   Labs: reviewed   Anthropometrics: Wt 117 lb 8 oz - increased   12/8 - 114 lb   Estimated Energy Needs  Kcals: 1700-1900 Protein: 75-90 Fluid: >/= 1.7 L  NUTRITION DIAGNOSIS: Inadequate oral intake continues - addressing with TF   MALNUTRITION DIAGNOSIS: Severe malnutrition continues    INTERVENTION:  Bag provided by DME is for portable oxygen per website - RD provided backpack, connected pump and tubing - pt appreciative  Continue 4 cartons Nutren 2.0 via pump  Diet advancement per SLP    MONITORING, EVALUATION, GOAL: wt trends, intake    NEXT VISIT: Monday December 22 during infusion

## 2024-01-04 NOTE — Patient Instructions (Signed)
 [facility] - A DEPT OF . [facility]  Discharge Instructions: Thank you for choosing [facility] to provide your oncology and hematology care.  If you have a lab appointment with the [facility] - please note that after April 8th, 2024, all labs will be drawn in the [facility].  You do not have to check in or register with the main entrance as you have in the past but will complete your check-in in the [facility].  Wear comfortable clothing and clothing appropriate for easy access to any Portacath or PICC line.   We strive to give you quality time with your provider. You may need to reschedule your appointment if you arrive late (15 or more minutes).  Arriving late affects you and other patients whose appointments are after yours.  Also, if you miss three or more appointments without notifying the office, you may be dismissed from the clinic at the providers discretion.      For prescription refill requests, have your pharmacy contact our office and allow 72 hours for refills to be completed.    Today you received the following chemotherapy and/or immunotherapy agents Cisplatin       To help prevent nausea and vomiting after your treatment, we encourage you to take your nausea medication as directed.  Cisplatin  Injection What is this medication? CISPLATIN  (SIS pla tin) treats some types of cancer. It works by slowing down the growth of cancer cells. This medicine may be used for other purposes; ask your health care provider or pharmacist if you have questions. COMMON BRAND NAME(S): Platinol , Platinol  -AQ What should I tell my care team before I take this medication? They need to know if you have any of these conditions: Eye disease, vision problems Hearing problems Kidney disease Low blood counts, such as low white cells, platelets, or red blood cells Tingling of the fingers or toes, or other nerve disorder An unusual or allergic  reaction to cisplatin , carboplatin, oxaliplatin, other medications, foods, dyes, or preservatives If you or your partner are pregnant or trying to get pregnant Breast-feeding How should I use this medication? This medication is injected into a vein. It is given by your care team in a hospital or clinic setting. Talk to your care team about the use of this medication in children. Special care may be needed. Overdosage: If you think you have taken too much of this medicine contact a poison control [facility] or emergency room at once. NOTE: This medicine is only for you. Do not share this medicine with others. What if I miss a dose? Keep appointments for follow-up doses. It is important not to miss your dose. Call your care team if you are unable to keep an appointment. What may interact with this medication? Do not take this medication with any of the following: Live virus vaccines This medication may also interact with the following: Certain antibiotics, such as amikacin, gentamicin, neomycin, polymyxin B , streptomycin, tobramycin, vancomycin  Foscarnet This list may not describe all possible interactions. Give your health care provider a list of all the medicines, herbs, non-prescription drugs, or dietary supplements you use. Also tell them if you smoke, drink alcohol, or use illegal drugs. Some items may interact with your medicine. What should I watch for while using this medication? Your condition will be monitored carefully while you are receiving this medication. You may need blood work done while taking this medication. This medication may make you feel generally unwell.  This is not uncommon, as chemotherapy can affect healthy cells as well as cancer cells. Report any side effects. Continue your course of treatment even though you feel ill unless your care team tells you to stop. This medication may increase your risk of getting an infection. Call your care team for advice if you get a fever,  chills, sore throat, or other symptoms of a cold or flu. Do not treat yourself. Try to avoid being around people who are sick. Avoid taking medications that contain aspirin, acetaminophen , ibuprofen , naproxen , or ketoprofen unless instructed by your care team. These medications may hide a fever. This medication may increase your risk to bruise or bleed. Call your care team if you notice any unusual bleeding. Be careful brushing or flossing your teeth or using a toothpick because you may get an infection or bleed more easily. If you have any dental work done, tell your dentist you are receiving this medication. Drink fluids as directed while you are taking this medication. This will help protect your kidneys. Call your care team if you get diarrhea. Do not treat yourself. Talk to your care team if you or your partner wish to become pregnant or think you might be pregnant. This medication can cause serious birth defects if taken during pregnancy and for 14 months after the last dose. A negative pregnancy test is required before starting this medication. A reliable form of contraception is recommended while taking this medication and for 14 months after the last dose. Talk to your care team about effective forms of contraception. Do not father a child while taking this medication and for 11 months after the last dose. Use a condom during sex during this time period. Do not breast-feed while taking this medication. This medication may cause infertility. Talk to your care team if you are concerned about your fertility. What side effects may I notice from receiving this medication? Side effects that you should report to your care team as soon as possible: Allergic reactions--skin rash, itching, hives, swelling of the face, lips, tongue, or throat Eye pain, change in vision, vision loss Hearing loss, ringing in ears Infection--fever, chills, cough, sore throat, wounds that don't heal, pain or trouble when  passing urine, general feeling of discomfort or being unwell Kidney injury--decrease in the amount of urine, swelling of the ankles, hands, or feet Low red blood cell level--unusual weakness or fatigue, dizziness, headache, trouble breathing Painful swelling, warmth, or redness of the skin, blisters or sores at the infusion site Pain, tingling, or numbness in the hands or feet Unusual bruising or bleeding Side effects that usually do not require medical attention (report to your care team if they continue or are bothersome): Hair loss Nausea Vomiting This list may not describe all possible side effects. Call your doctor for medical advice about side effects. You may report side effects to FDA at 1-800-FDA-1088. Where should I keep my medication? This medication is given in a hospital or clinic. It will not be stored at home. NOTE: This sheet is a summary. It may not cover all possible information. If you have questions about this medicine, talk to your doctor, pharmacist, or health care provider.  2024 Elsevier/Gold Standard (2021-05-10 00:00:00)  BELOW ARE SYMPTOMS THAT SHOULD BE REPORTED IMMEDIATELY: *FEVER GREATER THAN 100.4 F (38 C) OR HIGHER *CHILLS OR SWEATING *NAUSEA AND VOMITING THAT IS NOT CONTROLLED WITH YOUR NAUSEA MEDICATION *UNUSUAL SHORTNESS OF BREATH *UNUSUAL BRUISING OR BLEEDING *URINARY PROBLEMS (pain or burning when urinating, or  frequent urination) *BOWEL PROBLEMS (unusual diarrhea, constipation, pain near the anus) TENDERNESS IN MOUTH AND THROAT WITH OR WITHOUT PRESENCE OF ULCERS (sore throat, sores in mouth, or a toothache) UNUSUAL RASH, SWELLING OR PAIN  UNUSUAL VAGINAL DISCHARGE OR ITCHING   Items with * indicate a potential emergency and should be followed up as soon as possible or go to the Emergency Department if any problems should occur.  Please show the CHEMOTHERAPY ALERT CARD or IMMUNOTHERAPY ALERT CARD at check-in to the Emergency Department and triage  nurse.  Should you have questions after your visit or need to cancel or reschedule your appointment, please contact [facility] - A DEPT OF [facility] [phone]  and follow the prompts.  Office hours are 8:00 a.m. to 4:30 p.m. Monday - Friday. Please note that voicemails left after 4:00 p.m. may not be returned until the following business day.  We are closed weekends and major holidays. You have access to a nurse at all times for urgent questions. Please call the main number to the clinic [phone] and follow the prompts.  For any non-urgent questions, you may also contact your provider using MyChart. We now offer e-Visits for anyone 66 and older to request care online for non-urgent symptoms. For details visit [email or web address].   Also download the MyChart app! Go to the app store, search MyChart, open the app, select [facility], and log in with your MyChart username and password.

## 2024-01-04 NOTE — Progress Notes (Signed)
 Patient presents today for chemotherapy Cisplatin  infusion. Patient is in satisfactory condition with no new complaints voiced.  Vital signs are stable.  Labs reviewed by Dr. Davonna during the office visit and all labs are within treatment parameters.  We will proceed with treatment per MD orders.   Patient urinated 400 mL prior to Cisplatin  administration.   Treatment given today per MD orders. Tolerated infusion without adverse affects. Vital signs stable. No complaints at this time. Discharged from clinic ambulatory in stable condition. Alert and oriented x 3. F/U with [facility] as scheduled.

## 2024-01-05 ENCOUNTER — Other Ambulatory Visit: Payer: Self-pay

## 2024-01-06 ENCOUNTER — Telehealth: Payer: Self-pay | Admitting: *Deleted

## 2024-01-06 NOTE — Telephone Encounter (Signed)
 Clinical and referral for in home trach and feeding tube management was sent to Rob Medlin with [facility].  Patient was accepted for in home care, however per Rob, patient declined stating that he had the help he needed at this time.

## 2024-01-07 ENCOUNTER — Ambulatory Visit (HOSPITAL_COMMUNITY): Admission: RE | Admit: 2024-01-07 | Discharge: 2024-01-07 | Attending: Oncology

## 2024-01-07 DIAGNOSIS — C109 Malignant neoplasm of oropharynx, unspecified: Secondary | ICD-10-CM | POA: Diagnosis present

## 2024-01-07 MED ORDER — GADOBUTROL 1 MMOL/ML IV SOLN
5.0000 mL | Freq: Once | INTRAVENOUS | Status: AC | PRN
  Administered 2024-01-07: 17:00:00 5 mL via INTRAVENOUS

## 2024-01-08 ENCOUNTER — Inpatient Hospital Stay

## 2024-01-08 VITALS — BP 127/77 | HR 83 | Temp 97.2°F | Resp 18

## 2024-01-08 DIAGNOSIS — C109 Malignant neoplasm of oropharynx, unspecified: Secondary | ICD-10-CM

## 2024-01-08 DIAGNOSIS — Z5111 Encounter for antineoplastic chemotherapy: Secondary | ICD-10-CM | POA: Diagnosis not present

## 2024-01-08 MED ORDER — CETIRIZINE HCL 10 MG/ML IV SOLN
10.0000 mg | Freq: Once | INTRAVENOUS | Status: AC
  Administered 2024-01-08: 10 mg via INTRAVENOUS
  Filled 2024-01-08: qty 1

## 2024-01-08 MED ORDER — SODIUM CHLORIDE 0.9 % IV SOLN: INTRAVENOUS | Status: DC

## 2024-01-08 MED ORDER — ACETAMINOPHEN 325 MG PO TABS
650.0000 mg | ORAL_TABLET | Freq: Once | ORAL | Status: AC
  Administered 2024-01-08: 650 mg via ORAL
  Filled 2024-01-08: qty 2

## 2024-01-08 MED ORDER — SODIUM CHLORIDE 0.9 % IV SOLN
510.0000 mg | Freq: Once | INTRAVENOUS | Status: AC
  Administered 2024-01-08: 510 mg via INTRAVENOUS
  Filled 2024-01-08: qty 510

## 2024-01-08 NOTE — Progress Notes (Signed)
 Patient stated he is not resting well. Notified MD. She said to try Melatonin. Educated pt on that to try.     Feraheme iron given per orders. Patient tolerated it well without problems. Vitals stable and discharged home from clinic ambulatory. Follow up as scheduled.

## 2024-01-08 NOTE — Patient Instructions (Signed)

## 2024-01-11 ENCOUNTER — Inpatient Hospital Stay

## 2024-01-11 ENCOUNTER — Inpatient Hospital Stay: Admitting: Dietician

## 2024-01-11 VITALS — BP 134/85 | HR 95 | Temp 96.3°F | Resp 18

## 2024-01-11 DIAGNOSIS — C109 Malignant neoplasm of oropharynx, unspecified: Secondary | ICD-10-CM

## 2024-01-11 DIAGNOSIS — Z5111 Encounter for antineoplastic chemotherapy: Secondary | ICD-10-CM | POA: Diagnosis not present

## 2024-01-11 LAB — COMPREHENSIVE METABOLIC PANEL WITH GFR
ALT: 12 U/L (ref 0–44)
AST: 15 U/L (ref 15–41)
Albumin: 3.9 g/dL (ref 3.5–5.0)
Alkaline Phosphatase: 90 U/L (ref 38–126)
Anion gap: 6 (ref 5–15)
BUN: 15 mg/dL (ref 6–20)
CO2: 34 mmol/L — ABNORMAL HIGH (ref 22–32)
Calcium: 9.4 mg/dL (ref 8.9–10.3)
Chloride: 97 mmol/L — ABNORMAL LOW (ref 98–111)
Creatinine, Ser: 0.49 mg/dL — ABNORMAL LOW (ref 0.61–1.24)
GFR, Estimated: >60 mL/min
Glucose, Bld: 91 mg/dL (ref 70–99)
Potassium: 4.3 mmol/L (ref 3.5–5.1)
Sodium: 136 mmol/L (ref 135–145)
Total Bilirubin: 0.4 mg/dL (ref 0.0–1.2)
Total Protein: 6.9 g/dL (ref 6.5–8.1)

## 2024-01-11 LAB — CBC WITH DIFFERENTIAL/PLATELET
Abs Immature Granulocytes: 0.05 K/uL (ref 0.00–0.07)
Basophils Absolute: 0 K/uL (ref 0.0–0.1)
Basophils Relative: 0 %
Eosinophils Absolute: 0 K/uL (ref 0.0–0.5)
Eosinophils Relative: 0 %
HCT: 35.5 % — ABNORMAL LOW (ref 39.0–52.0)
Hemoglobin: 11.5 g/dL — ABNORMAL LOW (ref 13.0–17.0)
Immature Granulocytes: 1 %
Lymphocytes Relative: 5 %
Lymphs Abs: 0.5 K/uL — ABNORMAL LOW (ref 0.7–4.0)
MCH: 31.4 pg (ref 26.0–34.0)
MCHC: 32.4 g/dL (ref 30.0–36.0)
MCV: 97 fL (ref 80.0–100.0)
Monocytes Absolute: 1.1 K/uL — ABNORMAL HIGH (ref 0.1–1.0)
Monocytes Relative: 10 %
Neutro Abs: 9 K/uL — ABNORMAL HIGH (ref 1.7–7.7)
Neutrophils Relative %: 84 %
Platelets: 357 K/uL (ref 150–400)
RBC: 3.66 MIL/uL — ABNORMAL LOW (ref 4.22–5.81)
RDW: 11.9 % (ref 11.5–15.5)
WBC: 10.7 K/uL — ABNORMAL HIGH (ref 4.0–10.5)
nRBC: 0 % (ref 0.0–0.2)

## 2024-01-11 LAB — MAGNESIUM: Magnesium: 2.2 mg/dL (ref 1.7–2.4)

## 2024-01-11 MED ORDER — SODIUM CHLORIDE 0.9 % IV SOLN
150.0000 mg | Freq: Once | INTRAVENOUS | Status: AC
  Administered 2024-01-11: 150 mg via INTRAVENOUS
  Filled 2024-01-11: qty 150

## 2024-01-11 MED ORDER — DEXAMETHASONE SOD PHOSPHATE PF 10 MG/ML IJ SOLN
10.0000 mg | Freq: Once | INTRAMUSCULAR | Status: AC
  Administered 2024-01-11: 10 mg via INTRAVENOUS

## 2024-01-11 MED ORDER — ALUMINUM & MAGNESIUM HYDROXIDE 200-200 MG/5ML PO SUSP: 10.0000 mL | Freq: Four times a day (QID) | ORAL | 1 refills | Status: AC | PRN

## 2024-01-11 MED ORDER — SODIUM CHLORIDE 0.9 % IV SOLN: Freq: Once | INTRAVENOUS | Status: AC

## 2024-01-11 MED ORDER — SODIUM CHLORIDE 0.9 % IV SOLN
40.0000 mg/m2 | Freq: Once | INTRAVENOUS | Status: AC
  Administered 2024-01-11: 65 mg via INTRAVENOUS
  Filled 2024-01-11: qty 65

## 2024-01-11 MED ORDER — PALONOSETRON HCL INJECTION 0.25 MG/5ML
0.2500 mg | Freq: Once | INTRAVENOUS | Status: AC
  Administered 2024-01-11: 0.25 mg via INTRAVENOUS
  Filled 2024-01-11: qty 5

## 2024-01-11 MED ORDER — MAGNESIUM SULFATE 2 GM/50ML IV SOLN
2.0000 g | Freq: Once | INTRAVENOUS | Status: AC
  Administered 2024-01-11: 2 g via INTRAVENOUS
  Filled 2024-01-11: qty 50

## 2024-01-11 MED ORDER — POTASSIUM CHLORIDE IN NACL 20-0.9 MEQ/L-% IV SOLN
Freq: Once | INTRAVENOUS | Status: AC
  Filled 2024-01-11: qty 1000

## 2024-01-11 MED ORDER — SODIUM CHLORIDE 0.9 % IV SOLN: INTRAVENOUS | Status: DC

## 2024-01-11 MED ORDER — LIDOCAINE VISCOUS HCL 2 % MT SOLN: 15.0000 mL | OROMUCOSAL | 1 refills | Status: AC | PRN

## 2024-01-11 NOTE — Progress Notes (Signed)
 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 He is receiving concurrent chemoradiation with weekly cisplatin  (start 12/8). S/p PEG and trach 11/17/23   DME: [employer] (TF/supplies) DME: [employer] (trach/supplies)  Met with patient in infusion. He reports doing well. Denies sore throat, pain with swallowing, nausea, vomiting, diarrhea, constipation. Patient reports increasing Nutren 2.0 to 50 ml/hr which is tolerating. He is eating orally as well. Patient has not had evaluation with SLP, states that service is not offered with his HH. Reports toleraterating soft moist textures. Recalls ice cream, scrambled eggs, mashed potatoes. Had oodles of noodles last night. Patient is also drinking one Boost by mouth. He is surprised at weight loss given increased po + TF at goal. Patient notes he was wearing a lot more clothes last week.   Patient reports recent delivery of formula and supplies for tube. He is appreciative of ease of ordering. He is having difficulty obtaining trach supplies. Patient is almost out and was told by company representative that it was too early to reorder. He would need to place order in January.    Medications: reviewed   Labs: Cr 0.49  Anthropometrics: Wt 110 lb 14.3 oz today - decreased   12/15 - 117 lb 8 oz 12/8 - 114 lb    Estimated Energy Needs  Kcals: 1700-1900 Protein: 75-90 Fluid: >/= 1.7 L  NUTRITION DIAGNOSIS: Inadequate oral intake continues    MALNUTRITION DIAGNOSIS: Severe malnutrition continues    INTERVENTION:  Continue 4 cartons Nutren 2.0 x 50 ml/hr RD obtained local office number for [facility] RN to call with patient  Oral intake as tolerated     MONITORING, EVALUATION, GOAL: wt trends, intake, TF   NEXT VISIT: Monday December 29 during infusion

## 2024-01-11 NOTE — Progress Notes (Signed)
 Patients port flushed without difficulty.  Good blood return noted with no bruising or swelling noted at site. Patient remains accessed for treatment.

## 2024-01-11 NOTE — Patient Instructions (Signed)
 [facility] - A [facility]PT OF . [facility]  Discharge Instructions: Thank you for choosing [facility] to provide your oncology and hematology care.  If you have a lab appointment with the Cancer Center - please note that after April 8th, 2024, all labs will be drawn in the cancer center.  You do not have to check in or register with the main entrance as you have in the past but will complete your check-in in the cancer center.  Wear comfortable clothing and clothing appropriate for easy access to any Portacath or PICC line.   We strive to give you quality time with your provider. You may need to reschedule your appointment if you arrive late (15 or more minutes).  Arriving late affects you and other patients whose appointments are after yours.  Also, if you miss three or more appointments without notifying the office, you may be dismissed from the clinic at the providers discretion.      For prescription refill requests, have your pharmacy contact our office and allow 72 hours for refills to be completed.    Today you received the following chemotherapy and/or immunotherapy agents Cisplatin       To help prevent nausea and vomiting after your treatment, we encourage you to take your nausea medication as directed.  Cisplatin  Injection What is this medication? CISPLATIN  (SIS pla tin) treats some types of cancer. It works by slowing down the growth of cancer cells. This medicine may be used for other purposes; ask your health care provider or pharmacist if you have questions. COMMON BRAND NAME(S): Platinol , Platinol  -AQ What should I tell my care team before I take this medication? They need to know if you have any of these conditions: Eye disease, vision problems Hearing problems Kidney disease Low blood counts, such as low white cells, platelets, or red blood cells Tingling of the fingers or toes, or other nerve disorder An unusual or allergic  reaction to cisplatin , carboplatin, oxaliplatin, other medications, foods, dyes, or preservatives If you or your partner are pregnant or trying to get pregnant Breast-feeding How should I use this medication? This medication is injected into a vein. It is given by your care team in a hospital or clinic setting. Talk to your care team about the use of this medication in children. Special care may be needed. Overdosage: If you think you have taken too much of this medicine contact a poison control center or emergency room at once. NOTE: This medicine is only for you. Do not share this medicine with others. What if I miss a dose? Keep appointments for follow-up doses. It is important not to miss your dose. Call your care team if you are unable to keep an appointment. What may interact with this medication? Do not take this medication with any of the following: Live virus vaccines This medication may also interact with the following: Certain antibiotics, such as amikacin, gentamicin, neomycin, polymyxin B , streptomycin, tobramycin, vancomycin  Foscarnet This list may not describe all possible interactions. Give your health care provider a list of all the medicines, herbs, non-prescription drugs, or dietary supplements you use. Also tell them if you smoke, drink alcohol, or use illegal drugs. Some items may interact with your medicine. What should I watch for while using this medication? Your condition will be monitored carefully while you are receiving this medication. You may need blood work done while taking this medication. This medication may make you feel generally unwell.  This is not uncommon, as chemotherapy can affect healthy cells as well as cancer cells. Report any side effects. Continue your course of treatment even though you feel ill unless your care team tells you to stop. This medication may increase your risk of getting an infection. Call your care team for advice if you get a fever,  chills, sore throat, or other symptoms of a cold or flu. Do not treat yourself. Try to avoid being around people who are sick. Avoid taking medications that contain aspirin, acetaminophen , ibuprofen , naproxen , or ketoprofen unless instructed by your care team. These medications may hide a fever. This medication may increase your risk to bruise or bleed. Call your care team if you notice any unusual bleeding. Be careful brushing or flossing your teeth or using a toothpick because you may get an infection or bleed more easily. If you have any dental work done, tell your dentist you are receiving this medication. Drink fluids as directed while you are taking this medication. This will help protect your kidneys. Call your care team if you get diarrhea. Do not treat yourself. Talk to your care team if you or your partner wish to become pregnant or think you might be pregnant. This medication can cause serious birth defects if taken during pregnancy and for 14 months after the last dose. A negative pregnancy test is required before starting this medication. A reliable form of contraception is recommended while taking this medication and for 14 months after the last dose. Talk to your care team about effective forms of contraception. Do not father a child while taking this medication and for 11 months after the last dose. Use a condom during sex during this time period. Do not breast-feed while taking this medication. This medication may cause infertility. Talk to your care team if you are concerned about your fertility. What side effects may I notice from receiving this medication? Side effects that you should report to your care team as soon as possible: Allergic reactions--skin rash, itching, hives, swelling of the face, lips, tongue, or throat Eye pain, change in vision, vision loss Hearing loss, ringing in ears Infection--fever, chills, cough, sore throat, wounds that don't heal, pain or trouble when  passing urine, general feeling of discomfort or being unwell Kidney injury--decrease in the amount of urine, swelling of the ankles, hands, or feet Low red blood cell level--unusual weakness or fatigue, dizziness, headache, trouble breathing Painful swelling, warmth, or redness of the skin, blisters or sores at the infusion site Pain, tingling, or numbness in the hands or feet Unusual bruising or bleeding Side effects that usually do not require medical attention (report to your care team if they continue or are bothersome): Hair loss Nausea Vomiting This list may not describe all possible side effects. Call your doctor for medical advice about side effects. You may report side effects to FDA at 1-800-FDA-1088. Where should I keep my medication? This medication is given in a hospital or clinic. It will not be stored at home. NOTE: This sheet is a summary. It may not cover all possible information. If you have questions about this medicine, talk to your doctor, pharmacist, or health care provider.  2024 Elsevier/Gold Standard (2021-05-10 00:00:00)  BELOW ARE SYMPTOMS THAT SHOULD BE REPORTED IMMEDIATELY: *FEVER GREATER THAN 100.4 F (38 C) OR HIGHER *CHILLS OR SWEATING *NAUSEA AND VOMITING THAT IS NOT CONTROLLED WITH YOUR NAUSEA MEDICATION *UNUSUAL SHORTNESS OF BREATH *UNUSUAL BRUISING OR BLEEDING *URINARY PROBLEMS (pain or burning when urinating, or  frequent urination) *BOWEL PROBLEMS (unusual diarrhea, constipation, pain near the anus) TENDERNESS IN MOUTH AND THROAT WITH OR WITHOUT PRESENCE OF ULCERS (sore throat, sores in mouth, or a toothache) UNUSUAL RASH, SWELLING OR PAIN  UNUSUAL VAGINAL DISCHARGE OR ITCHING   Items with * indicate a potential emergency and should be followed up as soon as possible or go to the Emergency Department if any problems should occur.  Please show the CHEMOTHERAPY ALERT CARD or IMMUNOTHERAPY ALERT CARD at check-in to the Emergency Department and triage  nurse.  Should you have questions after your visit or need to cancel or reschedule your appointment, please contact [facility] - [facility] [phone]  and follow the prompts.  Office hours are 8:00 a.m. to 4:30 p.m. Monday - Friday. Please note that voicemails left after 4:00 p.m. may not be returned until the following business day.  We are closed weekends and major holidays. You have access to a nurse at all times for urgent questions. Please call the main number to the clinic [phone] and follow the prompts.  For any non-urgent questions, you may also contact your provider using MyChart. We now offer e-Visits for anyone 66 and older to request care online for non-urgent symptoms. For details visit [email or web address].   Also download the MyChart app! Go to the app store, search MyChart, open the app, select [facility], and log in with your MyChart username and password.

## 2024-01-11 NOTE — Progress Notes (Signed)
 Patient presents today for chemotherapy Cisplatin  infusion.  Patient is in satisfactory condition with no new complaints voiced.  Vital signs are stable.  Labs reviewed and all labs are within treatment parameters.  We will proceed with treatment per MD orders.    Patient urinated 200 mL prior to Cisplatin  administration.  Treatment given today per MD orders. Tolerated infusion without adverse affects. Vital signs stable. No complaints at this time. Discharged from clinic ambulatory in stable condition. Alert and oriented x 3. F/U with [facility] as scheduled.

## 2024-01-14 ENCOUNTER — Other Ambulatory Visit: Payer: Self-pay | Admitting: Oncology

## 2024-01-14 DIAGNOSIS — C109 Malignant neoplasm of oropharynx, unspecified: Secondary | ICD-10-CM

## 2024-01-18 ENCOUNTER — Inpatient Hospital Stay

## 2024-01-18 ENCOUNTER — Inpatient Hospital Stay: Admitting: Oncology

## 2024-01-18 ENCOUNTER — Inpatient Hospital Stay: Admitting: Dietician

## 2024-01-18 VITALS — BP 130/79 | HR 78 | Temp 96.5°F | Resp 18

## 2024-01-18 DIAGNOSIS — D649 Anemia, unspecified: Secondary | ICD-10-CM

## 2024-01-18 DIAGNOSIS — C109 Malignant neoplasm of oropharynx, unspecified: Secondary | ICD-10-CM

## 2024-01-18 DIAGNOSIS — G4489 Other headache syndrome: Secondary | ICD-10-CM

## 2024-01-18 DIAGNOSIS — Z5111 Encounter for antineoplastic chemotherapy: Secondary | ICD-10-CM | POA: Diagnosis not present

## 2024-01-18 DIAGNOSIS — R634 Abnormal weight loss: Secondary | ICD-10-CM

## 2024-01-18 DIAGNOSIS — D508 Other iron deficiency anemias: Secondary | ICD-10-CM | POA: Diagnosis not present

## 2024-01-18 LAB — CBC WITH DIFFERENTIAL/PLATELET
Abs Immature Granulocytes: 0.02 K/uL (ref 0.00–0.07)
Basophils Absolute: 0 K/uL (ref 0.0–0.1)
Basophils Relative: 0 %
Eosinophils Absolute: 0 K/uL (ref 0.0–0.5)
Eosinophils Relative: 1 %
HCT: 32.8 % — ABNORMAL LOW (ref 39.0–52.0)
Hemoglobin: 10.7 g/dL — ABNORMAL LOW (ref 13.0–17.0)
Immature Granulocytes: 0 %
Lymphocytes Relative: 6 %
Lymphs Abs: 0.4 K/uL — ABNORMAL LOW (ref 0.7–4.0)
MCH: 31.9 pg (ref 26.0–34.0)
MCHC: 32.6 g/dL (ref 30.0–36.0)
MCV: 97.9 fL (ref 80.0–100.0)
Monocytes Absolute: 0.7 K/uL (ref 0.1–1.0)
Monocytes Relative: 12 %
Neutro Abs: 4.7 K/uL (ref 1.7–7.7)
Neutrophils Relative %: 81 %
Platelets: 302 K/uL (ref 150–400)
RBC: 3.35 MIL/uL — ABNORMAL LOW (ref 4.22–5.81)
RDW: 12.5 % (ref 11.5–15.5)
WBC: 5.7 K/uL (ref 4.0–10.5)
nRBC: 0 % (ref 0.0–0.2)

## 2024-01-18 LAB — COMPREHENSIVE METABOLIC PANEL WITH GFR
ALT: 10 U/L (ref 0–44)
AST: 15 U/L (ref 15–41)
Albumin: 4 g/dL (ref 3.5–5.0)
Alkaline Phosphatase: 89 U/L (ref 38–126)
Anion gap: 12 (ref 5–15)
BUN: 12 mg/dL (ref 6–20)
CO2: 25 mmol/L (ref 22–32)
Calcium: 8.8 mg/dL — ABNORMAL LOW (ref 8.9–10.3)
Chloride: 97 mmol/L — ABNORMAL LOW (ref 98–111)
Creatinine, Ser: 0.41 mg/dL — ABNORMAL LOW (ref 0.61–1.24)
GFR, Estimated: >60 mL/min
Glucose, Bld: 95 mg/dL (ref 70–99)
Potassium: 4.6 mmol/L (ref 3.5–5.1)
Sodium: 134 mmol/L — ABNORMAL LOW (ref 135–145)
Total Bilirubin: 0.3 mg/dL (ref 0.0–1.2)
Total Protein: 6.5 g/dL (ref 6.5–8.1)

## 2024-01-18 LAB — MAGNESIUM: Magnesium: 2 mg/dL (ref 1.7–2.4)

## 2024-01-18 MED ORDER — PALONOSETRON HCL INJECTION 0.25 MG/5ML
0.2500 mg | Freq: Once | INTRAVENOUS | Status: AC
  Administered 2024-01-18: 0.25 mg via INTRAVENOUS
  Filled 2024-01-18: qty 5

## 2024-01-18 MED ORDER — CISPLATIN CHEMO INJECTION 100MG/100ML
40.0000 mg/m2 | Freq: Once | INTRAVENOUS | Status: AC
  Administered 2024-01-18: 65 mg via INTRAVENOUS
  Filled 2024-01-18: qty 65

## 2024-01-18 MED ORDER — MAGNESIUM SULFATE 2 GM/50ML IV SOLN
2.0000 g | Freq: Once | INTRAVENOUS | Status: AC
  Administered 2024-01-18: 2 g via INTRAVENOUS
  Filled 2024-01-18: qty 50

## 2024-01-18 MED ORDER — SODIUM CHLORIDE 0.9 % IV SOLN
150.0000 mg | Freq: Once | INTRAVENOUS | Status: AC
  Administered 2024-01-18: 150 mg via INTRAVENOUS
  Filled 2024-01-18: qty 150

## 2024-01-18 MED ORDER — SODIUM CHLORIDE 0.9 % IV SOLN: Freq: Once | INTRAVENOUS | Status: AC

## 2024-01-18 MED ORDER — DEXAMETHASONE SOD PHOSPHATE PF 10 MG/ML IJ SOLN
10.0000 mg | Freq: Once | INTRAMUSCULAR | Status: AC
  Administered 2024-01-18: 10 mg via INTRAVENOUS

## 2024-01-18 MED ORDER — POTASSIUM CHLORIDE IN NACL 20-0.9 MEQ/L-% IV SOLN
Freq: Once | INTRAVENOUS | Status: AC
  Filled 2024-01-18: qty 1000

## 2024-01-18 MED ORDER — SODIUM CHLORIDE 0.9 % IV SOLN: INTRAVENOUS | Status: DC

## 2024-01-18 NOTE — Patient Instructions (Addendum)
 [facility] at [facility] Discharge Instructions   You were seen and examined today by Dr. Davonna.  She reviewed the results of your lab work which are normal/stable.   She reviewed the results of your MRI of the brain. It did not show any evidence of cancer.   We will proceed with your treatment today.   Return as scheduled.    Thank you for choosing [facility] at [facility] to provide your oncology and hematology care.  To afford each patient quality time with our provider, please arrive at least 15 minutes before your scheduled appointment time.   If you have a lab appointment with the Cancer Center please come in thru the Main Entrance and check in at the main information desk.  You need to re-schedule your appointment should you arrive 10 or more minutes late.  We strive to give you quality time with our providers, and arriving late affects you and other patients whose appointments are after yours.  Also, if you no show three or more times for appointments you may be dismissed from the clinic at the providers discretion.     Again, thank you for choosing [facility].  Our hope is that these requests will decrease the amount of time that you wait before being seen by our physicians.       _____________________________________________________________  Should you have questions after your visit to [facility], please contact our office at [phone] and follow the prompts.  Our office hours are 8:00 a.m. and 4:30 p.m. Monday - Friday.  Please note that voicemails left after 4:00 p.m. may not be returned until the following business day.  We are closed weekends and major holidays.  You do have access to a nurse 24-7, just call the main number to the clinic [phone] and do not press any options, hold on the line and a nurse will answer the phone.    For prescription refill requests, have your pharmacy contact  our office and allow 72 hours.    Due to Covid, you will need to wear a mask upon entering the hospital. If you do not have a mask, a mask will be given to you at the Main Entrance upon arrival. For doctor visits, patients may have 1 support person age 10 or older with them. For treatment visits, patients can not have anyone with them due to social distancing guidelines and our immunocompromised population.

## 2024-01-18 NOTE — Patient Instructions (Signed)
 [facility] - A DEPT OF [facility]  Discharge Instructions: Thank you for choosing [facility] to provide your oncology and hematology care.  If you have a lab appointment with the Cancer Center - please note that after April 8th, 2024, all labs will be drawn in the cancer center.  You do not have to check in or register with the main entrance as you have in the past but will complete your check-in in the cancer center.  Wear comfortable clothing and clothing appropriate for easy access to any Portacath or PICC line.   We strive to give you quality time with your provider. You may need to reschedule your appointment if you arrive late (15 or more minutes).  Arriving late affects you and other patients whose appointments are after yours.  Also, if you miss three or more appointments without notifying the office, you may be dismissed from the clinic at the provider's discretion.      For prescription refill requests, have your pharmacy contact our office and allow 72 hours for refills to be completed.    Today you received the following chemotherapy and/or immunotherapy agents Cisplatin .  Cisplatin  Injection What is this medication? CISPLATIN  (SIS pla tin) treats some types of cancer. It works by slowing down the growth of cancer cells. This medicine may be used for other purposes; ask your health care provider or pharmacist if you have questions. COMMON BRAND NAME(S): Platinol , Platinol  -AQ What should I tell my care team before I take this medication? They need to know if you have any of these conditions: Eye disease, vision problems Hearing problems Kidney disease Low blood counts, such as low white cells, platelets, or red blood cells Tingling of the fingers or toes, or other nerve disorder An unusual or allergic reaction to cisplatin , carboplatin , oxaliplatin, other medications, foods, dyes, or preservatives If you or your partner are  pregnant or trying to get pregnant Breast-feeding How should I use this medication? This medication is injected into a vein. It is given by your care team in a hospital or clinic setting. Talk to your care team about the use of this medication in children. Special care may be needed. Overdosage: If you think you have taken too much of this medicine contact a poison control center or emergency room at once. NOTE: This medicine is only for you. Do not share this medicine with others. What if I miss a dose? Keep appointments for follow-up doses. It is important not to miss your dose. Call your care team if you are unable to keep an appointment. What may interact with this medication? Do not take this medication with any of the following: Live virus vaccines This medication may also interact with the following: Certain antibiotics, such as amikacin, gentamicin, neomycin, polymyxin B, streptomycin, tobramycin, vancomycin Foscarnet This list may not describe all possible interactions. Give your health care provider a list of all the medicines, herbs, non-prescription drugs, or dietary supplements you use. Also tell them if you smoke, drink alcohol, or use illegal drugs. Some items may interact with your medicine. What should I watch for while using this medication? Your condition will be monitored carefully while you are receiving this medication. You may need blood work done while taking this medication. This medication may make you feel generally unwell. This is not uncommon, as chemotherapy can affect healthy cells as well as cancer cells. Report any side effects. Continue your course of treatment  even though you feel ill unless your care team tells you to stop. This medication may increase your risk of getting an infection. Call your care team for advice if you get a fever, chills, sore throat, or other symptoms of a cold or flu. Do not treat yourself. Try to avoid being around people who are  sick. Avoid taking medications that contain aspirin, acetaminophen , ibuprofen, naproxen, or ketoprofen unless instructed by your care team. These medications may hide a fever. This medication may increase your risk to bruise or bleed. Call your care team if you notice any unusual bleeding. Be careful brushing or flossing your teeth or using a toothpick because you may get an infection or bleed more easily. If you have any dental work done, tell your dentist you are receiving this medication. Drink fluids as directed while you are taking this medication. This will help protect your kidneys. Call your care team if you get diarrhea. Do not treat yourself. Talk to your care team if you or your partner wish to become pregnant or think you might be pregnant. This medication can cause serious birth defects if taken during pregnancy and for 14 months after the last dose. A negative pregnancy test is required before starting this medication. A reliable form of contraception is recommended while taking this medication and for 14 months after the last dose. Talk to your care team about effective forms of contraception. Do not father a child while taking this medication and for 11 months after the last dose. Use a condom during sex during this time period. Do not breast-feed while taking this medication. This medication may cause infertility. Talk to your care team if you are concerned about your fertility. What side effects may I notice from receiving this medication? Side effects that you should report to your care team as soon as possible: Allergic reactions--skin rash, itching, hives, swelling of the face, lips, tongue, or throat Eye pain, change in vision, vision loss Hearing loss, ringing in ears Infection--fever, chills, cough, sore throat, wounds that don't heal, pain or trouble when passing urine, general feeling of discomfort or being unwell Kidney injury--decrease in the amount of urine, swelling of  the ankles, hands, or feet Low red blood cell level--unusual weakness or fatigue, dizziness, headache, trouble breathing Painful swelling, warmth, or redness of the skin, blisters or sores at the infusion site Pain, tingling, or numbness in the hands or feet Unusual bruising or bleeding Side effects that usually do not require medical attention (report to your care team if they continue or are bothersome): Hair loss Nausea Vomiting This list may not describe all possible side effects. Call your doctor for medical advice about side effects. You may report side effects to FDA at 1-800-FDA-1088. Where should I keep my medication? This medication is given in a hospital or clinic. It will not be stored at home. NOTE: This sheet is a summary. It may not cover all possible information. If you have questions about this medicine, talk to your doctor, pharmacist, or health care provider.  2024 Elsevier/Gold Standard (2021-05-10 00:00:00)       To help prevent nausea and vomiting after your treatment, we encourage you to take your nausea medication as directed.  BELOW ARE SYMPTOMS THAT SHOULD BE REPORTED IMMEDIATELY: *FEVER GREATER THAN 100.4 F (38 C) OR HIGHER *CHILLS OR SWEATING *NAUSEA AND VOMITING THAT IS NOT CONTROLLED WITH YOUR NAUSEA MEDICATION *UNUSUAL SHORTNESS OF BREATH *UNUSUAL BRUISING OR BLEEDING *URINARY PROBLEMS (pain or burning when urinating,  or frequent urination) *BOWEL PROBLEMS (unusual diarrhea, constipation, pain near the anus) TENDERNESS IN MOUTH AND THROAT WITH OR WITHOUT PRESENCE OF ULCERS (sore throat, sores in mouth, or a toothache) UNUSUAL RASH, SWELLING OR PAIN  UNUSUAL VAGINAL DISCHARGE OR ITCHING   Items with * indicate a potential emergency and should be followed up as soon as possible or go to the Emergency Department if any problems should occur.  Please show the CHEMOTHERAPY ALERT CARD or IMMUNOTHERAPY ALERT CARD at check-in to the Emergency Department and  triage nurse.  Should you have questions after your visit or need to cancel or reschedule your appointment, please contact [facility] [place] - A DEPT OF [facility] [phone]  and follow the prompts.  Office hours are 8:00 a.m. to 4:30 p.m. Monday - Friday. Please note that voicemails left after 4:00 p.m. may not be returned until the following business day.  We are closed weekends and major holidays. You have access to a nurse at all times for urgent questions. Please call the main number to the clinic [phone] and follow the prompts.  For any non-urgent questions, you may also contact your provider using MyChart. We now offer e-Visits for anyone 64 and older to request care online for non-urgent symptoms. For details visit [email or web address].   Also download the MyChart app! Go to the app store, search MyChart, open the app, select [facility], and log in with your MyChart username and password.

## 2024-01-18 NOTE — Progress Notes (Unsigned)
 " Patient Care Team: [name], [name], NP as PCP - General Davonna Siad, MD as Medical Oncologist (Medical Oncology) Celestia Joesph SQUIBB, RN as Oncology Nurse Navigator (Medical Oncology)  Clinic Day:  01/18/2024  Referring physician: Benjamin [name], NP   CHIEF COMPLAINT:  CC: Recurrent oropharyngeal cancer    ASSESSMENT & PLAN:   Assessment & Plan: Jerome Washington  is a 53 y.o. 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 Likely T4N1M0 disease Discussed by ENT at [facility] and recommendation for oropharyngectomy with free flap and possible laryngectomy with post-CRT v/s CRT. Patient chose chemo RT. Started chemo RT with cisplatin  on 12/28/2023 [facility] NGS showed PD-L1 positive, CPS: 5   - C4D1 today. Tolerating well with only occasional tinnitus. - Labs reviewed today: CMP: Normal creatinine and LFTs.  CBC: Hemoglobin: 10.7, Normal WBC and platelets - Physical exam stable today.  Will proceed with chemotherapy today. - Will repeat imaging 3-4 weeks after completion of chemo RT  Return to clinic in 2 weeks for follow up  Anemia secondary to antineoplastic chemotherapy Mildly decreased hemoglobin due to chemotherapy, asymptomatic. - Monitored hemoglobin levels.  Protein-calorie malnutrition Weight stable but below baseline, receiving gastrostomy tube feeds. - Encouraged increased caloric intake to promote weight gain.  Severe iron deficiency Severe iron deficiency likely secondary to nutritional deficiency TSAT: 6, ferritin: 147  - Received IV iron infusion -Will repeat labs in 8 weeks  Insomnia and headache Persistent insomnia and headaches, possibly cancer or treatment-related. Ibuprofen  suggested for headache relief. MRI brain with no evidence of disease  - Continue ibuprofen  400 mg for headache  relief. - Improved now  Feeding difficulty with gastrostomy status Receiving gastrostomy tube feeds, able to eat orally without difficulty. - Referred to [facility] for gastrostomy tube education and feeding regimen guidance. - Continued gastrostomy tube feeds as tolerated.  Tobacco use Patient is a former smoker and quit smoking recently.   -Encouraged abstinence from smoking.   The patient understands the plans discussed today and is in agreement with them.  He knows to contact our office if he develops concerns prior to his next appointment.  The total time spent in the appointment was 22 minutes for the encounter with patient, including review of chart and various tests results, discussions about plan of care and coordination of care plan   Siad Davonna, MD  [facility] [place] - A DEPT OF [facility] [address] [place] [address] Dept: [phone] Dept Fax: [phone]   No orders of the defined types were placed in this encounter.    ONCOLOGY HISTORY:   Diagnosis: Recurrent oropharyngeal carcinoma   -10/22/2018: Floor of mouth, biopsy: Invasive squamous cell carcinoma, well-differentiated, keratinizing type.  The tumor has a thickness of at least 4 mm and involves the specimen base. 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  All surgical margins are negative for tumor or high-grade dysplasia.  No lymph node involvement.  Rest of the areas negative for carcinoma. -11/17/2023: Oropharynx excision: -Squamous cell carcinoma, moderately differentiated.  - Immunohistochemical stain for p16 is negative  -12/03/2023: PET scan: Large hypermetabolic mass involving the posterior oropharynx with metastatic disease involving the right posterolateral retropharyngeal space consistent with recurrent  malignancy. No suspicious finding to suggest new distant metastasis within the  abdomen and pelvis. -12/11/2023: IR guided port insertion -12/28/2023- Current: Weekly cisplatin  40 mg/m -12/28/2023- Current: RT to the oropharyngeal mass. - 12/11/ 2025: [facility] NGS: PD-L1: Positive, CPS: 5, TP 53: Positive  - NTRK 1/2/3, RET, ERBB2, CDKN2a: Negative  - MSI-stable, TMB: Low, 5 mut/Mb.    Current Treatment:  CRT with cisplatin   INTERVAL HISTORY:   Discussed the use of AI scribe software for clinical note transcription with the patient, who gave verbal consent to proceed.  History of Present Illness Jerome Washington is a 53 year old male with malignant neoplasm of the oropharynx currently on chemoradiation who presents for oncology follow-up during active treatment.  He is currently receiving combined chemotherapy and radiation therapy, having completed fourteen sessions of radiation. He reports feeling well and has no complaints today. He was informed that his recent MRI was good and showed no cancer in his brain.  He was told his hemoglobin was a little low, which is thought to be related to chemotherapy and iron deficiency.  He is dependent on gastrostomy tube feedings, administering approximately four units daily. He is able to swallow and eat by mouth without difficulty. His weight is stable at 117 pounds, though he was previously heavier, and there is a goal to increase his weight.   He has experienced intermittent episodes of malaise and insomnia, but currently reports feeling well with no active complaints. He recalls a prior hospitalization for severe illness, but does not specify the timing. He denies other systemic symptoms.    I have reviewed the past medical history, past surgical history, social history and family history with the patient and they are unchanged from previous note.  ALLERGIES:  is allergic to bee venom, morphine  and codeine , and penicillins.  MEDICATIONS:   Current Outpatient Medications  Medication Sig Dispense Refill   acetaminophen  (TYLENOL ) 500 MG tablet Crush and place 1 tablet (500 mg total) into feeding tube every 8 (eight) hours as needed as directed. 20 tablet 0   albuterol  (PROVENTIL ) (2.5 MG/3ML) 0.083% nebulizer solution Take 3 mLs (2.5 mg total) by nebulization every 2 (two) hours as needed for wheezing or shortness of breath. 90 mL 12   aluminum -magnesium  hydroxide 200-200 MG/5ML suspension Take 10 mLs by mouth every 6 (six) hours as needed for indigestion (mouth sores/pain). Mix 1:1 with viscous lidocaine  2% 480 mL 1   dexamethasone  (DECADRON ) 4 MG tablet Take 2 tablets (8 mg) by mouth daily x 3 days starting the day after cisplatin  chemotherapy. Take with food. 30 tablet 1   docusate (COLACE) 50 MG/5ML liquid Place 10 mLs (100 mg total) into feeding tube 2 (two) times daily as needed for mild constipation. 100 mL 0   ferrous sulfate 324 MG TBEC Take 324 mg by mouth.     hydrOXYzine  (VISTARIL ) 25 MG capsule Take 1 capsule (25 mg total) by mouth at bedtime as needed for anxiety. 30 capsule 0   lidocaine  (XYLOCAINE ) 2 % solution Use as directed 15 mLs in the mouth or throat as needed for mouth pain. Mix 1:1 with Maalox 480 mL 1   lidocaine -prilocaine  (EMLA ) cream Apply 1 Application topically as needed. 30 g 1   lidocaine -prilocaine  (EMLA ) cream Apply to affected area once 30 g 3   Multiple Vitamin (MULTIVITAMIN WITH MINERALS) TABS tablet Crush and place 1 tablet into feeding tube daily as directed. 30 tablet 0   Nutritional Supplements (FEEDING SUPPLEMENT, OSMOLITE 1.5 CAL,) LIQD Place 1,000 mLs into feeding tube continuous. 35cc/hr     ondansetron  (  ZOFRAN ) 8 MG tablet Take 1 tablet (8 mg total) by mouth every 8 (eight) hours as needed for nausea or vomiting. Start on the third day after cisplatin . 30 tablet 1   polyethylene glycol powder (GLYCOLAX /MIRALAX ) 17 GM/SCOOP powder Take 17 g by mouth daily as needed for moderate constipation.  Dissolve 1 capful (17g) in 4-8 ounces of liquid and take by mouth daily. 238 g 0   predniSONE  (DELTASONE ) 20 MG tablet TAKE 1 TABLET BY MOUTH ONCE DAILY WITH BREAKFAST FOR 5 DAYS     prochlorperazine  (COMPAZINE ) 10 MG tablet Take 1 tablet (10 mg total) by mouth every 6 (six) hours as needed for nausea or vomiting. 30 tablet 0   prochlorperazine  (COMPAZINE ) 10 MG tablet Take 1 tablet (10 mg total) by mouth every 6 (six) hours as needed (Nausea or vomiting). 30 tablet 1   propranolol (INDERAL) 10 MG tablet Take 10 mg by mouth.     Protein (FEEDING SUPPLEMENT, PROSOURCE TF20,) liquid Place 60 mLs into feeding tube 2 (two) times daily. 60 mL 5   traZODone (DESYREL) 50 MG tablet Take 50 mg by mouth daily.     Water  For Irrigation, Sterile (FREE WATER ) SOLN Place 200 mLs into feeding tube every 8 (eight) hours.     No current facility-administered medications for this visit.     VITALS:  There were no vitals taken for this visit.  Wt Readings from Last 3 Encounters:  01/18/24 112 lb 6.4 oz (51 kg)  01/11/24 110 lb 14.3 oz (50.3 kg)  01/04/24 117 lb 8 oz (53.3 kg)    There is no height or weight on file to calculate BMI.  Performance status (ECOG): 0 - Asymptomatic  PHYSICAL EXAM:   GENERAL:alert, no distress and comfortable OROPHARYNX: Large oropharyngeal mass palpated. No lymphadenopathy palpated.  Tracheostomy tube in place.  NECK: supple, thyroid normal size, non-tender, without nodularity LYMPH:  no palpable lymphadenopathy in the cervical, axillary or inguinal LUNGS: clear to auscultation and percussion with normal breathing effort HEART: regular rate & rhythm and no murmurs and no lower extremity edema ABDOMEN:abdomen soft, non-tender and normal bowel sounds, G-tube in place   LABORATORY DATA:  I have reviewed the data as listed     Component Value Date/Time   NA 134 (L) 01/18/2024 0834   K 4.6 01/18/2024 0834   CL 97 (L) 01/18/2024 0834   CO2 25 01/18/2024 0834   GLUCOSE  95 01/18/2024 0834   BUN 12 01/18/2024 0834   CREATININE 0.41 (L) 01/18/2024 0834   CALCIUM 8.8 (L) 01/18/2024 0834   PROT 6.5 01/18/2024 0834   ALBUMIN 4.0 01/18/2024 0834   AST 15 01/18/2024 0834   ALT 10 01/18/2024 0834   ALKPHOS 89 01/18/2024 0834   BILITOT 0.3 01/18/2024 0834   GFRNONAA >60 01/18/2024 0834   GFRAA >60 07/23/2015 2130     Lab Results  Component Value Date   WBC 5.7 01/18/2024   NEUTROABS 4.7 01/18/2024   HGB 10.7 (L) 01/18/2024   HCT 32.8 (L) 01/18/2024   MCV 97.9 01/18/2024   PLT 302 01/18/2024      Chemistry      Component Value Date/Time   NA 134 (L) 01/18/2024 0834   K 4.6 01/18/2024 0834   CL 97 (L) 01/18/2024 0834   CO2 25 01/18/2024 0834   BUN 12 01/18/2024 0834   CREATININE 0.41 (L) 01/18/2024 0834      Component Value Date/Time   CALCIUM 8.8 (L) 01/18/2024 9165  ALKPHOS 89 01/18/2024 0834   AST 15 01/18/2024 0834   ALT 10 01/18/2024 0834   BILITOT 0.3 01/18/2024 0834        Latest Reference Range & Units 12/09/23 14:16  Iron 45 - 182 ug/dL 19 (L)  UIBC ug/dL 713  TIBC 749 - 549 ug/dL 694  Saturation Ratios 17.9 - 39.5 % 6 (L)  Ferritin 24 - 336 ng/mL 147  Folate >5.9 ng/mL 19.5  Vitamin B12 180 - 914 pg/mL 643  (L): Data is abnormally low  RADIOGRAPHIC STUDIES: I have personally reviewed the radiological images as listed and agreed with the findings in the report.  MR Brain W Wo Contrast CLINICAL DATA:  Oropharyngeal cancer staging  EXAM: MRI HEAD WITHOUT AND WITH CONTRAST  TECHNIQUE: Multiplanar, multiecho pulse sequences of the brain and surrounding structures were obtained without and with intravenous contrast.  CONTRAST:  5mL GADAVIST  GADOBUTROL  1 MMOL/ML IV SOLN  COMPARISON:  None Available.  FINDINGS: MRI brain:  There is a partially imaged large nasopharyngeal/oropharyngeal mass. There is a 3 cm cystic lesion anterior and inferior to the left parotid gland which is also incompletely imaged.  There is  symmetric enhancement of the facial nerve on both sides. Meckel's cave is normal on both sides.  The bone marrow signal within the clivus is normal.  The signal in the brain parenchyma is normal.  No abnormal enhancement within the brain parenchyma.  There is no acute or chronic infarct.  The ventricles are normal.  There are normal flow signals in the carotid arteries and basilar artery.  No significant bone marrow signal abnormality.  IMPRESSION: Large partially imaged oropharyngeal and nasopharyngeal mass.  No evidence of intracranial extension or perineural spread  Electronically Signed   By: Nancyann Burns M.D.   On: 01/13/2024 09:14    "

## 2024-01-18 NOTE — Progress Notes (Signed)
 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 He is receiving concurrent chemoradiation with weekly cisplatin  (start 12/8). S/p PEG and trach 11/17/23    DME: [employer] (TF/supplies) DME: [employer] (trach/supplies)  Met with patient in infusion. He reports tolerating concurrent treatment well. Patient has noticed improvement to swallow noting decreased tumor burden. He is eating some orally. Tolerates ice cream, eggs, chicken noodle soup, meatloaf, mashed potatoes/gravy. Patient is giving 4 cartons Nutren 2.0 via pump. Tolerating well. Patient continues to have problems with receiving the correct inner canula size for his trach. Company sending a size that is too large. Asking if respiratory team may have a few extra he could have.   Medications: reviewed   Labs: Na 134, Cr 0.41. Ca 8.8, albumin WNL  Anthropometrics: Wt 112 lb 6.4 oz today - increased   12/22 - 110 lb 14.3 oz  12/15 - 117 lb 8 oz 12/8 - 114 lb   Estimated Energy Needs  Kcals: 1700-1900 Protein: 75-90 Fluid: >/= 1.7 L  NUTRITION DIAGNOSIS: Inadequate oral intake continues - addressing with TF   MALNUTRITION DIAGNOSIS: Severe malnutrition continues    INTERVENTION:  Continue 4 cartons Nutren 2.0 via pump Encourage oral intake of soft smooth textures as tolerated  Respiratory agreeable to evaluate patient     MONITORING, EVALUATION, GOAL: wt trends, intake   NEXT VISIT: Monday January 5 during infusion

## 2024-01-18 NOTE — Progress Notes (Signed)
 Patient presents today for treatment and follow up visit with Dr. Davonna. D1 C4 Cisplatin . Vital signs within parameters for treatment. Labs drawn and pending. Patient denies any side effects related to last treatment. Patient states,  My mouth is a little sore but I feel great and I am using the mouth wash given to me.  No concerns or questions at this time. Patient states radiation therapy was performed this am prior to arrival and going well.  Patient placed in waiting room for scheduled doctor appointment.   Lab work and vital signs within parameters for treatment.   Message received from A.Lenon RN / Dr. Davonna to proceed with treatment.   Treatment given today per MD orders. Tolerated infusion without adverse affects. Vital signs stable. No complaints at this time. Discharged from clinic ambulatory in stable condition. Alert and oriented x 3. F/U with [facility] as scheduled.

## 2024-01-18 NOTE — Progress Notes (Unsigned)
 Patient has been examined by Dr. Davonna. Vital signs and labs have been reviewed by MD - ANC, Creatinine, LFTs, hemoglobin, and platelets have been reviewed by M.D. - pt may proceed with treatment.  Primary RN and pharmacy notified.

## 2024-01-19 ENCOUNTER — Other Ambulatory Visit: Payer: Self-pay

## 2024-01-20 ENCOUNTER — Other Ambulatory Visit: Payer: Self-pay

## 2024-01-20 ENCOUNTER — Ambulatory Visit (HOSPITAL_COMMUNITY): Attending: Speech Pathology | Admitting: Speech Pathology

## 2024-01-21 ENCOUNTER — Encounter: Payer: Self-pay | Admitting: Oncology

## 2024-01-22 ENCOUNTER — Inpatient Hospital Stay: Attending: Oncology

## 2024-01-22 DIAGNOSIS — T451X5A Adverse effect of antineoplastic and immunosuppressive drugs, initial encounter: Secondary | ICD-10-CM | POA: Insufficient documentation

## 2024-01-22 DIAGNOSIS — Z79630 Long term (current) use of alkylating agent: Secondary | ICD-10-CM | POA: Insufficient documentation

## 2024-01-22 DIAGNOSIS — E46 Unspecified protein-calorie malnutrition: Secondary | ICD-10-CM | POA: Insufficient documentation

## 2024-01-22 DIAGNOSIS — Z5111 Encounter for antineoplastic chemotherapy: Secondary | ICD-10-CM | POA: Insufficient documentation

## 2024-01-22 DIAGNOSIS — E611 Iron deficiency: Secondary | ICD-10-CM | POA: Insufficient documentation

## 2024-01-22 DIAGNOSIS — C109 Malignant neoplasm of oropharynx, unspecified: Secondary | ICD-10-CM | POA: Insufficient documentation

## 2024-01-22 DIAGNOSIS — D6481 Anemia due to antineoplastic chemotherapy: Secondary | ICD-10-CM | POA: Insufficient documentation

## 2024-01-25 ENCOUNTER — Inpatient Hospital Stay

## 2024-01-25 ENCOUNTER — Inpatient Hospital Stay: Admitting: Dietician

## 2024-01-25 VITALS — BP 127/74 | HR 84 | Temp 98.3°F | Resp 18

## 2024-01-25 DIAGNOSIS — T451X5A Adverse effect of antineoplastic and immunosuppressive drugs, initial encounter: Secondary | ICD-10-CM | POA: Diagnosis not present

## 2024-01-25 DIAGNOSIS — C109 Malignant neoplasm of oropharynx, unspecified: Secondary | ICD-10-CM

## 2024-01-25 DIAGNOSIS — E46 Unspecified protein-calorie malnutrition: Secondary | ICD-10-CM | POA: Diagnosis not present

## 2024-01-25 DIAGNOSIS — E611 Iron deficiency: Secondary | ICD-10-CM | POA: Diagnosis not present

## 2024-01-25 DIAGNOSIS — Z79630 Long term (current) use of alkylating agent: Secondary | ICD-10-CM | POA: Diagnosis not present

## 2024-01-25 DIAGNOSIS — D6481 Anemia due to antineoplastic chemotherapy: Secondary | ICD-10-CM | POA: Diagnosis not present

## 2024-01-25 DIAGNOSIS — Z5111 Encounter for antineoplastic chemotherapy: Secondary | ICD-10-CM | POA: Diagnosis present

## 2024-01-25 LAB — CBC WITH DIFFERENTIAL/PLATELET
Abs Immature Granulocytes: 0.02 K/uL (ref 0.00–0.07)
Basophils Absolute: 0 K/uL (ref 0.0–0.1)
Basophils Relative: 0 %
Eosinophils Absolute: 0 K/uL (ref 0.0–0.5)
Eosinophils Relative: 1 %
HCT: 33.6 % — ABNORMAL LOW (ref 39.0–52.0)
Hemoglobin: 11.1 g/dL — ABNORMAL LOW (ref 13.0–17.0)
Immature Granulocytes: 0 %
Lymphocytes Relative: 5 %
Lymphs Abs: 0.3 K/uL — ABNORMAL LOW (ref 0.7–4.0)
MCH: 32.2 pg (ref 26.0–34.0)
MCHC: 33 g/dL (ref 30.0–36.0)
MCV: 97.4 fL (ref 80.0–100.0)
Monocytes Absolute: 0.8 K/uL (ref 0.1–1.0)
Monocytes Relative: 15 %
Neutro Abs: 4.3 K/uL (ref 1.7–7.7)
Neutrophils Relative %: 79 %
Platelets: 263 K/uL (ref 150–400)
RBC: 3.45 MIL/uL — ABNORMAL LOW (ref 4.22–5.81)
RDW: 13.3 % (ref 11.5–15.5)
WBC: 5.4 K/uL (ref 4.0–10.5)
nRBC: 0 % (ref 0.0–0.2)

## 2024-01-25 LAB — COMPREHENSIVE METABOLIC PANEL WITH GFR
ALT: 13 U/L (ref 0–44)
AST: 16 U/L (ref 15–41)
Albumin: 4.2 g/dL (ref 3.5–5.0)
Alkaline Phosphatase: 80 U/L (ref 38–126)
Anion gap: 13 (ref 5–15)
BUN: 14 mg/dL (ref 6–20)
CO2: 25 mmol/L (ref 22–32)
Calcium: 9.1 mg/dL (ref 8.9–10.3)
Chloride: 94 mmol/L — ABNORMAL LOW (ref 98–111)
Creatinine, Ser: 0.42 mg/dL — ABNORMAL LOW (ref 0.61–1.24)
GFR, Estimated: >60 mL/min
Glucose, Bld: 96 mg/dL (ref 70–99)
Potassium: 4.7 mmol/L (ref 3.5–5.1)
Sodium: 131 mmol/L — ABNORMAL LOW (ref 135–145)
Total Bilirubin: 0.5 mg/dL (ref 0.0–1.2)
Total Protein: 6.7 g/dL (ref 6.5–8.1)

## 2024-01-25 LAB — MAGNESIUM: Magnesium: 2.1 mg/dL (ref 1.7–2.4)

## 2024-01-25 MED ORDER — PALONOSETRON HCL INJECTION 0.25 MG/5ML
0.2500 mg | Freq: Once | INTRAVENOUS | Status: AC
  Administered 2024-01-25: 0.25 mg via INTRAVENOUS
  Filled 2024-01-25: qty 5

## 2024-01-25 MED ORDER — SODIUM CHLORIDE 0.9 % IV SOLN: INTRAVENOUS | Status: DC

## 2024-01-25 MED ORDER — POTASSIUM CHLORIDE IN NACL 20-0.9 MEQ/L-% IV SOLN
Freq: Once | INTRAVENOUS | Status: AC
  Filled 2024-01-25: qty 1000

## 2024-01-25 MED ORDER — SODIUM CHLORIDE 0.9 % IV SOLN
40.0000 mg/m2 | Freq: Once | INTRAVENOUS | Status: AC
  Administered 2024-01-25: 65 mg via INTRAVENOUS
  Filled 2024-01-25: qty 65

## 2024-01-25 MED ORDER — SODIUM CHLORIDE 0.9 % IV SOLN: Freq: Once | INTRAVENOUS | Status: AC

## 2024-01-25 MED ORDER — MAGNESIUM SULFATE 2 GM/50ML IV SOLN
2.0000 g | Freq: Once | INTRAVENOUS | Status: AC
  Administered 2024-01-25: 2 g via INTRAVENOUS
  Filled 2024-01-25: qty 50

## 2024-01-25 MED ORDER — DEXAMETHASONE SOD PHOSPHATE PF 10 MG/ML IJ SOLN
10.0000 mg | Freq: Once | INTRAMUSCULAR | Status: AC
  Administered 2024-01-25: 10 mg via INTRAVENOUS

## 2024-01-25 MED ORDER — SODIUM CHLORIDE 0.9 % IV SOLN
150.0000 mg | Freq: Once | INTRAVENOUS | Status: AC
  Administered 2024-01-25: 150 mg via INTRAVENOUS
  Filled 2024-01-25: qty 150

## 2024-01-25 NOTE — Patient Instructions (Signed)
 [facility] - A DEPT OF [facility]  Discharge Instructions: Thank you for choosing [facility] to provide your oncology and hematology care.  If you have a lab appointment with the Cancer Center - please note that after April 8th, 2024, all labs will be drawn in the cancer center.  You do not have to check in or register with the main entrance as you have in the past but will complete your check-in in the cancer center.  Wear comfortable clothing and clothing appropriate for easy access to any Portacath or PICC line.   We strive to give you quality time with your provider. You may need to reschedule your appointment if you arrive late (15 or more minutes).  Arriving late affects you and other patients whose appointments are after yours.  Also, if you miss three or more appointments without notifying the office, you may be dismissed from the clinic at the provider's discretion.      For prescription refill requests, have your pharmacy contact our office and allow 72 hours for refills to be completed.    Today you received the following chemotherapy and/or immunotherapy agents Cisplatin .  Cisplatin  Injection What is this medication? CISPLATIN  (SIS pla tin) treats some types of cancer. It works by slowing down the growth of cancer cells. This medicine may be used for other purposes; ask your health care provider or pharmacist if you have questions. COMMON BRAND NAME(S): Platinol , Platinol  -AQ What should I tell my care team before I take this medication? They need to know if you have any of these conditions: Eye disease, vision problems Hearing problems Kidney disease Low blood counts, such as low white cells, platelets, or red blood cells Tingling of the fingers or toes, or other nerve disorder An unusual or allergic reaction to cisplatin , carboplatin , oxaliplatin, other medications, foods, dyes, or preservatives If you or your partner are  pregnant or trying to get pregnant Breast-feeding How should I use this medication? This medication is injected into a vein. It is given by your care team in a hospital or clinic setting. Talk to your care team about the use of this medication in children. Special care may be needed. Overdosage: If you think you have taken too much of this medicine contact a poison control center or emergency room at once. NOTE: This medicine is only for you. Do not share this medicine with others. What if I miss a dose? Keep appointments for follow-up doses. It is important not to miss your dose. Call your care team if you are unable to keep an appointment. What may interact with this medication? Do not take this medication with any of the following: Live virus vaccines This medication may also interact with the following: Certain antibiotics, such as amikacin, gentamicin, neomycin, polymyxin B, streptomycin, tobramycin, vancomycin Foscarnet This list may not describe all possible interactions. Give your health care provider a list of all the medicines, herbs, non-prescription drugs, or dietary supplements you use. Also tell them if you smoke, drink alcohol, or use illegal drugs. Some items may interact with your medicine. What should I watch for while using this medication? Your condition will be monitored carefully while you are receiving this medication. You may need blood work done while taking this medication. This medication may make you feel generally unwell. This is not uncommon, as chemotherapy can affect healthy cells as well as cancer cells. Report any side effects. Continue your course of treatment  even though you feel ill unless your care team tells you to stop. This medication may increase your risk of getting an infection. Call your care team for advice if you get a fever, chills, sore throat, or other symptoms of a cold or flu. Do not treat yourself. Try to avoid being around people who are  sick. Avoid taking medications that contain aspirin, acetaminophen , ibuprofen, naproxen, or ketoprofen unless instructed by your care team. These medications may hide a fever. This medication may increase your risk to bruise or bleed. Call your care team if you notice any unusual bleeding. Be careful brushing or flossing your teeth or using a toothpick because you may get an infection or bleed more easily. If you have any dental work done, tell your dentist you are receiving this medication. Drink fluids as directed while you are taking this medication. This will help protect your kidneys. Call your care team if you get diarrhea. Do not treat yourself. Talk to your care team if you or your partner wish to become pregnant or think you might be pregnant. This medication can cause serious birth defects if taken during pregnancy and for 14 months after the last dose. A negative pregnancy test is required before starting this medication. A reliable form of contraception is recommended while taking this medication and for 14 months after the last dose. Talk to your care team about effective forms of contraception. Do not father a child while taking this medication and for 11 months after the last dose. Use a condom during sex during this time period. Do not breast-feed while taking this medication. This medication may cause infertility. Talk to your care team if you are concerned about your fertility. What side effects may I notice from receiving this medication? Side effects that you should report to your care team as soon as possible: Allergic reactions--skin rash, itching, hives, swelling of the face, lips, tongue, or throat Eye pain, change in vision, vision loss Hearing loss, ringing in ears Infection--fever, chills, cough, sore throat, wounds that don't heal, pain or trouble when passing urine, general feeling of discomfort or being unwell Kidney injury--decrease in the amount of urine, swelling of  the ankles, hands, or feet Low red blood cell level--unusual weakness or fatigue, dizziness, headache, trouble breathing Painful swelling, warmth, or redness of the skin, blisters or sores at the infusion site Pain, tingling, or numbness in the hands or feet Unusual bruising or bleeding Side effects that usually do not require medical attention (report to your care team if they continue or are bothersome): Hair loss Nausea Vomiting This list may not describe all possible side effects. Call your doctor for medical advice about side effects. You may report side effects to FDA at 1-800-FDA-1088. Where should I keep my medication? This medication is given in a hospital or clinic. It will not be stored at home. NOTE: This sheet is a summary. It may not cover all possible information. If you have questions about this medicine, talk to your doctor, pharmacist, or health care provider.  2024 Elsevier/Gold Standard (2021-05-10 00:00:00)       To help prevent nausea and vomiting after your treatment, we encourage you to take your nausea medication as directed.  BELOW ARE SYMPTOMS THAT SHOULD BE REPORTED IMMEDIATELY: *FEVER GREATER THAN 100.4 F (38 C) OR HIGHER *CHILLS OR SWEATING *NAUSEA AND VOMITING THAT IS NOT CONTROLLED WITH YOUR NAUSEA MEDICATION *UNUSUAL SHORTNESS OF BREATH *UNUSUAL BRUISING OR BLEEDING *URINARY PROBLEMS (pain or burning when urinating,  or frequent urination) *BOWEL PROBLEMS (unusual diarrhea, constipation, pain near the anus) TENDERNESS IN MOUTH AND THROAT WITH OR WITHOUT PRESENCE OF ULCERS (sore throat, sores in mouth, or a toothache) UNUSUAL RASH, SWELLING OR PAIN  UNUSUAL VAGINAL DISCHARGE OR ITCHING   Items with * indicate a potential emergency and should be followed up as soon as possible or go to the Emergency Department if any problems should occur.  Please show the CHEMOTHERAPY ALERT CARD or IMMUNOTHERAPY ALERT CARD at check-in to the Emergency Department and  triage nurse.  Should you have questions after your visit or need to cancel or reschedule your appointment, please contact [facility] [place] - A DEPT OF [facility] [phone]  and follow the prompts.  Office hours are 8:00 a.m. to 4:30 p.m. Monday - Friday. Please note that voicemails left after 4:00 p.m. may not be returned until the following business day.  We are closed weekends and major holidays. You have access to a nurse at all times for urgent questions. Please call the main number to the clinic [phone] and follow the prompts.  For any non-urgent questions, you may also contact your provider using MyChart. We now offer e-Visits for anyone 64 and older to request care online for non-urgent symptoms. For details visit [email or web address].   Also download the MyChart app! Go to the app store, search MyChart, open the app, select [facility], and log in with your MyChart username and password.

## 2024-01-25 NOTE — Progress Notes (Signed)
 Patient presents today for weekly Cisplatin . (D1C5) Vital signs are within parameters for treatment. Labs drawn and pending. Consent and port placement verified. MAR reviewed.  Patient voided 100 mls of urine.   Cisplatin  given today per MD orders. Tolerated infusion without adverse affects. Vital signs stable. No complaints at this time. Discharged from clinic ambulatory in stable condition. Alert and oriented x 3. F/U with [facility] as scheduled.

## 2024-01-26 ENCOUNTER — Other Ambulatory Visit: Payer: Self-pay

## 2024-01-31 NOTE — Progress Notes (Signed)
 " Patient Care Team: [name], [name], NP as PCP - General Davonna Siad, MD as Medical Oncologist (Medical Oncology) Celestia Joesph SQUIBB, RN as Oncology Nurse Navigator (Medical Oncology)  Clinic Day:  02/01/2024  Referring physician: Benjamin [name], NP   CHIEF COMPLAINT:  CC: Recurrent oropharyngeal cancer    ASSESSMENT & PLAN:   Assessment & Plan: Jerome Washington  is a 54 y.o. 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 Likely T4N1M0 disease Discussed by ENT at [facility] and recommendation for oropharyngectomy with free flap and possible laryngectomy with post-CRT v/s CRT. Patient chose chemo RT. Started chemo RT with cisplatin  on 12/28/2023 [facility] NGS showed PD-L1 positive, CPS: 5   - C6D1 today. Tolerating well with only occasional tinnitus. - Labs reviewed today: CMP: Normal creatinine and LFTs.  CBC: Hemoglobin: 10.8, Normal WBC and platelets - Physical exam stable today.  Will proceed with chemotherapy today. 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 - Monitored hemoglobin levels.  Protein-calorie malnutrition Weight stable but below baseline, receiving gastrostomy tube feeds. - Encouraged increased caloric intake to promote weight gain.  Severe iron deficiency Severe iron deficiency likely secondary to nutritional deficiency TSAT: 6, ferritin: 147  - Received IV iron infusion -Will repeat labs in 8 weeks  Insomnia and headache Persistent insomnia and headaches, possibly cancer or treatment-related. Ibuprofen  suggested for headache relief. MRI brain with no evidence of disease  - Continue ibuprofen  400 mg for headache  relief. - Improved now  Tobacco use Patient is a former smoker and quit smoking recently.   -Encouraged abstinence from smoking.  Radiation-induced neck swelling Mild neck swelling and redness due to radiation therapy, no acute distress. - Continued radiation therapy as scheduled.  Gastrostomy tube care with local irritation A little redness and occasional soreness at gastrostomy site. - Encouraged cleaning of the gastrostomy tube site two to three times daily.   The patient understands the plans discussed today and is in agreement with them.  He knows to contact our office if he develops concerns prior to his next appointment.  The total time spent in the appointment was 15 minutes for the encounter with patient, including review of chart and various tests results, discussions about plan of care and coordination of care plan   Siad Davonna, MD  [facility] [place] - A DEPT OF [facility] [address] [place] [address] Dept: [phone] Dept Fax: [phone]   Orders Placed This Encounter  Procedures   Ferritin    Standing Status:   Future    Expected Date:   02/08/2024    Expiration Date:   05/08/2024   Folate    Standing Status:   Future    Expected Date:   02/08/2024    Expiration Date:   05/08/2024   Vitamin B12    Standing Status:   Future    Expected Date:   02/08/2024    Expiration Date:   05/08/2024   Iron and TIBC    Standing Status:   Future    Expected Date:   02/08/2024    Expiration Date:   05/08/2024     ONCOLOGY HISTORY:   Diagnosis: Recurrent oropharyngeal carcinoma   -  10/22/2018: Floor of mouth, biopsy: Invasive squamous cell carcinoma, well-differentiated, keratinizing type.  The tumor has a thickness of at least 4 mm and involves the specimen base. -11/09/2018: Partial glossectomy: Midline ventral tongue floor of mouth, resection: Invasive keratinizing well-differentiated squamous cell  carcinoma (1.4 cm).  All surgical margins are negative for tumor or high-grade dysplasia.  No lymph node involvement.  Rest of the areas negative for carcinoma. -11/17/2023: Oropharynx excision: -Squamous cell carcinoma, moderately differentiated.  - Immunohistochemical stain for p16 is negative  -12/03/2023: PET scan: Large hypermetabolic mass involving the posterior oropharynx with metastatic disease involving the right posterolateral retropharyngeal space consistent with recurrent malignancy. No suspicious finding to suggest new distant metastasis within the abdomen and pelvis. -12/11/2023: IR guided port insertion -12/28/2023- Current: Weekly cisplatin  40 mg/m -12/28/2023- Current: RT to the oropharyngeal mass. - 12/11/ 2025: [facility] NGS: PD-L1: Positive, CPS: 5, TP 53: Positive  - NTRK 1/2/3, RET, ERBB2, CDKN2a: Negative  - MSI-stable, TMB: Low, 5 mut/Mb.    Current Treatment:  CRT with cisplatin   INTERVAL HISTORY:   Discussed the use of AI scribe software for clinical note transcription with the patient, who gave verbal consent to proceed.  History of Present Illness Jerome Washington is a 54 year old male with head and neck cancer undergoing radiation therapy who presents for follow-up.  He is currently receiving radiation therapy for head and neck cancer and reports mild neck swelling, which he attributes to radiation. He describes the swelling as a 'little ring' and notes this is typical for him during treatment. He remains active, walking eight miles daily, and does not report acute distress related to the swelling. He denies significant tinnitus, stating 'not much' when asked.  He is dependent on gastrostomy tube feeds and describes intermittent soreness at the tube site, though not present at the time of the visit. He notes some redness and minor leakage at the site, which he manages by cleaning two to three times daily. He denies significant complaints related to tube feeds and  reports feeds are tolerated well.    I have reviewed the past medical history, past surgical history, social history and family history with the patient and they are unchanged from previous note.  ALLERGIES:  is allergic to bee venom, morphine  and codeine , and penicillins.  MEDICATIONS:  Current Outpatient Medications  Medication Sig Dispense Refill   acetaminophen  (TYLENOL ) 500 MG tablet Crush and place 1 tablet (500 mg total) into feeding tube every 8 (eight) hours as needed as directed. 20 tablet 0   albuterol  (PROVENTIL ) (2.5 MG/3ML) 0.083% nebulizer solution Take 3 mLs (2.5 mg total) by nebulization every 2 (two) hours as needed for wheezing or shortness of breath. 90 mL 12   aluminum -magnesium  hydroxide 200-200 MG/5ML suspension Take 10 mLs by mouth every 6 (six) hours as needed for indigestion (mouth sores/pain). Mix 1:1 with viscous lidocaine  2% 480 mL 1   dexamethasone  (DECADRON ) 4 MG tablet Take 2 tablets (8 mg) by mouth daily x 3 days starting the day after cisplatin  chemotherapy. Take with food. 30 tablet 1   docusate (COLACE) 50 MG/5ML liquid Place 10 mLs (100 mg total) into feeding tube 2 (two) times daily as needed for mild constipation. 100 mL 0   ferrous sulfate 324 MG TBEC Take 324 mg by mouth.     hydrOXYzine  (VISTARIL ) 25 MG capsule Take 1 capsule (25 mg total) by mouth at bedtime as needed for anxiety. 30 capsule 0   lidocaine  (XYLOCAINE ) 2 % solution Use  as directed 15 mLs in the mouth or throat as needed for mouth pain. Mix 1:1 with Maalox 480 mL 1   lidocaine -prilocaine  (EMLA ) cream Apply 1 Application topically as needed. 30 g 1   lidocaine -prilocaine  (EMLA ) cream Apply to affected area once 30 g 3   Multiple Vitamin (MULTIVITAMIN WITH MINERALS) TABS tablet Crush and place 1 tablet into feeding tube daily as directed. 30 tablet 0   Nutritional Supplements (FEEDING SUPPLEMENT, OSMOLITE 1.5 CAL,) LIQD Place 1,000 mLs into feeding tube continuous. 35cc/hr     nystatin  (MYCOSTATIN) 100000 UNIT/ML suspension Take 5 mLs by mouth 4 (four) times daily.     ondansetron  (ZOFRAN ) 8 MG tablet Take 1 tablet (8 mg total) by mouth every 8 (eight) hours as needed for nausea or vomiting. Start on the third day after cisplatin . 30 tablet 1   polyethylene glycol powder (GLYCOLAX /MIRALAX ) 17 GM/SCOOP powder Take 17 g by mouth daily as needed for moderate constipation. Dissolve 1 capful (17g) in 4-8 ounces of liquid and take by mouth daily. 238 g 0   prochlorperazine  (COMPAZINE ) 10 MG tablet Take 1 tablet (10 mg total) by mouth every 6 (six) hours as needed for nausea or vomiting. 30 tablet 0   prochlorperazine  (COMPAZINE ) 10 MG tablet Take 1 tablet (10 mg total) by mouth every 6 (six) hours as needed (Nausea or vomiting). 30 tablet 1   propranolol (INDERAL) 10 MG tablet Take 10 mg by mouth.     Protein (FEEDING SUPPLEMENT, PROSOURCE TF20,) liquid Place 60 mLs into feeding tube 2 (two) times daily. 60 mL 5   traZODone (DESYREL) 50 MG tablet Take 50 mg by mouth daily.     Water  For Irrigation, Sterile (FREE WATER ) SOLN Place 200 mLs into feeding tube every 8 (eight) hours.     predniSONE  (DELTASONE ) 20 MG tablet TAKE 1 TABLET BY MOUTH ONCE DAILY WITH BREAKFAST FOR 5 DAYS (Patient not taking: Reported on 02/01/2024)     No current facility-administered medications for this visit.     VITALS:  Blood pressure 114/81, pulse 75, temperature 97.9 F (36.6 C), temperature source Tympanic, resp. rate 18, height 5' 11 (1.803 m), weight 113 lb (51.3 kg), SpO2 100%.  Wt Readings from Last 3 Encounters:  02/01/24 113 lb (51.3 kg)  01/18/24 112 lb 6.4 oz (51 kg)  01/11/24 110 lb 14.3 oz (50.3 kg)    Body mass index is 15.76 kg/m.  Performance status (ECOG): 0 - Asymptomatic  PHYSICAL EXAM:   GENERAL:alert, no distress and comfortable OROPHARYNX: Large oropharyngeal mass palpated. No lymphadenopathy palpated.  Tracheostomy tube in place. NECK: Redness in the neck, diffuse, non  tender LYMPH:  no palpable lymphadenopathy in the cervical, axillary or inguinal LUNGS: clear to auscultation and percussion with normal breathing effort HEART: regular rate & rhythm and no murmurs and no lower extremity edema ABDOMEN:abdomen soft, non-tender and normal bowel sounds, G-tube in place   LABORATORY DATA:  I have reviewed the data as listed     Component Value Date/Time   NA 132 (L) 02/01/2024 0938   K 4.4 02/01/2024 0938   CL 94 (L) 02/01/2024 0938   CO2 26 02/01/2024 0938   GLUCOSE 93 02/01/2024 0938   BUN 14 02/01/2024 0938   CREATININE 0.40 (L) 02/01/2024 0938   CALCIUM 9.0 02/01/2024 0938   PROT 6.3 (L) 02/01/2024 0938   ALBUMIN 4.0 02/01/2024 0938   AST 15 02/01/2024 0938   ALT 15 02/01/2024 0938   ALKPHOS 70 02/01/2024 9061  BILITOT 0.7 02/01/2024 0938   GFRNONAA >60 02/01/2024 0938   GFRAA >60 07/23/2015 2130     Lab Results  Component Value Date   WBC 4.2 02/01/2024   NEUTROABS 3.5 02/01/2024   HGB 10.8 (L) 02/01/2024   HCT 32.4 (L) 02/01/2024   MCV 97.9 02/01/2024   PLT 256 02/01/2024      Chemistry      Component Value Date/Time   NA 132 (L) 02/01/2024 0938   K 4.4 02/01/2024 0938   CL 94 (L) 02/01/2024 0938   CO2 26 02/01/2024 0938   BUN 14 02/01/2024 0938   CREATININE 0.40 (L) 02/01/2024 0938      Component Value Date/Time   CALCIUM 9.0 02/01/2024 0938   ALKPHOS 70 02/01/2024 0938   AST 15 02/01/2024 0938   ALT 15 02/01/2024 0938   BILITOT 0.7 02/01/2024 0938        Latest Reference Range & Units 12/09/23 14:16  Iron 45 - 182 ug/dL 19 (L)  UIBC ug/dL 713  TIBC 749 - 549 ug/dL 694  Saturation Ratios 17.9 - 39.5 % 6 (L)  Ferritin 24 - 336 ng/mL 147  Folate >5.9 ng/mL 19.5  Vitamin B12 180 - 914 pg/mL 643  (L): Data is abnormally low  RADIOGRAPHIC STUDIES: I have personally reviewed the radiological images as listed and agreed with the findings in the report.  "

## 2024-02-01 ENCOUNTER — Inpatient Hospital Stay

## 2024-02-01 ENCOUNTER — Inpatient Hospital Stay (HOSPITAL_BASED_OUTPATIENT_CLINIC_OR_DEPARTMENT_OTHER): Admitting: Oncology

## 2024-02-01 ENCOUNTER — Inpatient Hospital Stay: Admitting: Dietician

## 2024-02-01 ENCOUNTER — Inpatient Hospital Stay: Admitting: Oncology

## 2024-02-01 VITALS — BP 121/79 | HR 77 | Temp 97.6°F | Resp 18

## 2024-02-01 VITALS — BP 114/81 | HR 75 | Temp 97.9°F | Resp 18 | Ht 71.0 in | Wt 113.0 lb

## 2024-02-01 DIAGNOSIS — C109 Malignant neoplasm of oropharynx, unspecified: Secondary | ICD-10-CM

## 2024-02-01 DIAGNOSIS — Z5111 Encounter for antineoplastic chemotherapy: Secondary | ICD-10-CM | POA: Diagnosis not present

## 2024-02-01 DIAGNOSIS — D508 Other iron deficiency anemias: Secondary | ICD-10-CM

## 2024-02-01 DIAGNOSIS — Z931 Gastrostomy status: Secondary | ICD-10-CM | POA: Diagnosis not present

## 2024-02-01 DIAGNOSIS — G4489 Other headache syndrome: Secondary | ICD-10-CM

## 2024-02-01 LAB — COMPREHENSIVE METABOLIC PANEL WITH GFR
ALT: 15 U/L (ref 0–44)
AST: 15 U/L (ref 15–41)
Albumin: 4 g/dL (ref 3.5–5.0)
Alkaline Phosphatase: 70 U/L (ref 38–126)
Anion gap: 12 (ref 5–15)
BUN: 14 mg/dL (ref 6–20)
CO2: 26 mmol/L (ref 22–32)
Calcium: 9 mg/dL (ref 8.9–10.3)
Chloride: 94 mmol/L — ABNORMAL LOW (ref 98–111)
Creatinine, Ser: 0.4 mg/dL — ABNORMAL LOW (ref 0.61–1.24)
GFR, Estimated: >60 mL/min
Glucose, Bld: 93 mg/dL (ref 70–99)
Potassium: 4.4 mmol/L (ref 3.5–5.1)
Sodium: 132 mmol/L — ABNORMAL LOW (ref 135–145)
Total Bilirubin: 0.7 mg/dL (ref 0.0–1.2)
Total Protein: 6.3 g/dL — ABNORMAL LOW (ref 6.5–8.1)

## 2024-02-01 LAB — CBC WITH DIFFERENTIAL/PLATELET
Abs Immature Granulocytes: 0.02 K/uL (ref 0.00–0.07)
Basophils Absolute: 0 K/uL (ref 0.0–0.1)
Basophils Relative: 0 %
Eosinophils Absolute: 0 K/uL (ref 0.0–0.5)
Eosinophils Relative: 0 %
HCT: 32.4 % — ABNORMAL LOW (ref 39.0–52.0)
Hemoglobin: 10.8 g/dL — ABNORMAL LOW (ref 13.0–17.0)
Immature Granulocytes: 1 %
Lymphocytes Relative: 5 %
Lymphs Abs: 0.2 K/uL — ABNORMAL LOW (ref 0.7–4.0)
MCH: 32.6 pg (ref 26.0–34.0)
MCHC: 33.3 g/dL (ref 30.0–36.0)
MCV: 97.9 fL (ref 80.0–100.0)
Monocytes Absolute: 0.5 K/uL (ref 0.1–1.0)
Monocytes Relative: 11 %
Neutro Abs: 3.5 K/uL (ref 1.7–7.7)
Neutrophils Relative %: 83 %
Platelets: 256 K/uL (ref 150–400)
RBC: 3.31 MIL/uL — ABNORMAL LOW (ref 4.22–5.81)
RDW: 14.5 % (ref 11.5–15.5)
WBC: 4.2 K/uL (ref 4.0–10.5)
nRBC: 0 % (ref 0.0–0.2)

## 2024-02-01 LAB — MAGNESIUM: Magnesium: 2 mg/dL (ref 1.7–2.4)

## 2024-02-01 MED ORDER — PALONOSETRON HCL INJECTION 0.25 MG/5ML
0.2500 mg | Freq: Once | INTRAVENOUS | Status: AC
  Administered 2024-02-01: 0.25 mg via INTRAVENOUS
  Filled 2024-02-01: qty 5

## 2024-02-01 MED ORDER — MAGNESIUM SULFATE 2 GM/50ML IV SOLN
2.0000 g | Freq: Once | INTRAVENOUS | Status: AC
  Administered 2024-02-01: 2 g via INTRAVENOUS
  Filled 2024-02-01: qty 50

## 2024-02-01 MED ORDER — SODIUM CHLORIDE 0.9 % IV SOLN: Freq: Once | INTRAVENOUS | Status: AC

## 2024-02-01 MED ORDER — SODIUM CHLORIDE 0.9 % IV SOLN: INTRAVENOUS | Status: DC

## 2024-02-01 MED ORDER — POTASSIUM CHLORIDE IN NACL 20-0.9 MEQ/L-% IV SOLN
Freq: Once | INTRAVENOUS | Status: AC
  Filled 2024-02-01: qty 1000

## 2024-02-01 MED ORDER — SODIUM CHLORIDE 0.9 % IV SOLN
40.0000 mg/m2 | Freq: Once | INTRAVENOUS | Status: AC
  Administered 2024-02-01: 65 mg via INTRAVENOUS
  Filled 2024-02-01: qty 65

## 2024-02-01 MED ORDER — SODIUM CHLORIDE 0.9 % IV SOLN
150.0000 mg | Freq: Once | INTRAVENOUS | Status: AC
  Administered 2024-02-01: 150 mg via INTRAVENOUS
  Filled 2024-02-01: qty 150

## 2024-02-01 MED ORDER — DEXAMETHASONE SOD PHOSPHATE PF 10 MG/ML IJ SOLN
10.0000 mg | Freq: Once | INTRAMUSCULAR | Status: AC
  Administered 2024-02-01: 10 mg via INTRAVENOUS
  Filled 2024-02-01: qty 1

## 2024-02-01 NOTE — Patient Instructions (Signed)

## 2024-02-01 NOTE — Progress Notes (Signed)
 Labs reviewed ok to treat per parameters. Waiting on pt to void still.    1145-pt voided total of 100 ml of amber colored urine.  1310-pt voided 150 more mls of clear yellow urine, will hang Cisplatin  now.    Treatment given per orders. Patient tolerated it well without problems. Vitals stable and discharged home from clinic ambulatory. Follow up as scheduled.

## 2024-02-01 NOTE — Progress Notes (Signed)
 SPIRITUAL CARE AND COUNSELING CONSULT NOTE   VISIT SUMMARY   Reason for Visit: Chaplain identified Pt on the schedule as a Pt I had not connected with yet and visited to deliver introduction to Spiritual Care  Description of Visit: Upon arrival I found Jerome Washington (what Pt prefers to be called) seated in the recliner receiving treatment, with no support person present.  I introduced myself as the chaplain for the cancer center and offered a brief education on the role of a chaplain and the support we can offer to our patients, caregivers, and staff.     This is Pt's second bout with cancer- it came back.  He presents as having good perspective of the journey and settled state of mind.  Appears to have good coping skills and states that he has a good circle of family for care giving needs.   Has 6 grandchildren and wants to get well to see them grow up.  Plan of Care: I will continue to follow up with Jerome Washington on a monthly basis.   SPIRITUAL ENCOUNTER                                                                                                                                                                      Type of Visit: Initial Care provided to:: Patient Referral source: Chaplain assessment Reason for visit:  (Introduction to Spiritual Care) OnCall Visit: No   SPIRITUAL FRAMEWORK  Presenting Themes: Goals in life/care, Values and beliefs, Significant life change, Impactful experiences and emotions Values/beliefs: Professes a beleif in a higher power- The man upstairs Community/Connection: Family, Other (comment) (6 Grandchildren) Strengths: Perspective, Humility, Hope Needs/Challenges/Barriers: Not reviewed at this time Patient Stress Factors: Not reviewed Family Stress Factors: Not reviewed   GOALS   Self/Personal Goals: Not reviewed at this time Clinical Care Goals: create space for conducive spiritual care   INTERVENTIONS   Spiritual Care Interventions Made: Established  relationship of care and support, Reflective listening, Narrative/life review, Other (comment) (Introduction to Spiritual Care)    INTERVENTION OUTCOMES   Outcomes: Connection to spiritual care, Awareness of support  SPIRITUAL CARE PLAN   Spiritual Care Issues Still Outstanding: Chaplain will continue to follow   Jerome Washington, MDiv Chaplain, [facility] [email or web address] [phone] 02/01/2024 2:19 PM

## 2024-02-01 NOTE — Patient Instructions (Signed)
 [facility] [place] - A DEPT OF [place]  Discharge Instructions: Thank you for choosing [facility] to provide your oncology and hematology care.  If you have a lab appointment with the Cancer Center - please note that after April 8th, 2024, all labs will be drawn in the cancer center.  You do not have to check in or register with the main entrance as you have in the past but will complete your check-in in the cancer center.  Wear comfortable clothing and clothing appropriate for easy access to any Portacath or PICC line.   We strive to give you quality time with your provider. You may need to reschedule your appointment if you arrive late (15 or more minutes).  Arriving late affects you and other patients whose appointments are after yours.  Also, if you miss three or more appointments without notifying the office, you may be dismissed from the clinic at the provider's discretion.      For prescription refill requests, have your pharmacy contact our office and allow 72 hours for refills to be completed.    Today you received the following chemotherapy and/or immunotherapy agents Cisplatin       To help prevent nausea and vomiting after your treatment, we encourage you to take your nausea medication as directed.  BELOW ARE SYMPTOMS THAT SHOULD BE REPORTED IMMEDIATELY: *FEVER GREATER THAN 100.4 F (38 C) OR HIGHER *CHILLS OR SWEATING *NAUSEA AND VOMITING THAT IS NOT CONTROLLED WITH YOUR NAUSEA MEDICATION *UNUSUAL SHORTNESS OF BREATH *UNUSUAL BRUISING OR BLEEDING *URINARY PROBLEMS (pain or burning when urinating, or frequent urination) *BOWEL PROBLEMS (unusual diarrhea, constipation, pain near the anus) TENDERNESS IN MOUTH AND THROAT WITH OR WITHOUT PRESENCE OF ULCERS (sore throat, sores in mouth, or a toothache) UNUSUAL RASH, SWELLING OR PAIN  UNUSUAL VAGINAL DISCHARGE OR ITCHING   Items with * indicate a potential emergency and should be followed up  as soon as possible or go to the Emergency Department if any problems should occur.  Please show the CHEMOTHERAPY ALERT CARD or IMMUNOTHERAPY ALERT CARD at check-in to the Emergency Department and triage nurse.  Should you have questions after your visit or need to cancel or reschedule your appointment, please contact [facility] [place] - A DEPT OF [facility] [phone]  and follow the prompts.  Office hours are 8:00 a.m. to 4:30 p.m. Monday - Friday. Please note that voicemails left after 4:00 p.m. may not be returned until the following business day.  We are closed weekends and major holidays. You have access to a nurse at all times for urgent questions. Please call the main number to the clinic [phone] and follow the prompts.  For any non-urgent questions, you may also contact your provider using MyChart. We now offer e-Visits for anyone 84 and older to request care online for non-urgent symptoms. For details visit [email or web address].   Also download the MyChart app! Go to the app store, search MyChart, open the app, select [place], and log in with your MyChart username and password.

## 2024-02-01 NOTE — Progress Notes (Signed)
 Patients port flushed without difficulty.  Good blood return noted with no bruising or swelling noted at site. Patient remains accessed for treatment.

## 2024-02-01 NOTE — Progress Notes (Signed)
 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 He is receiving concurrent chemoradiation with weekly cisplatin  (start 12/8). S/p PEG and trach 11/17/23    Met with pt in infusion. He is doing well overall. Reports oral intake is becoming harder secondary to sore throat. He is tolerating tube feedings at goal (4 cartons Nutren 2.0 @ 50 ml/hr). He denies nausea, vomiting, diarrhea, constipation.   Medications: reviewed   Labs: Na 132, Cr 0.40  Anthropometrics: Wt 113 lb today - trending up  12/29 - 112 lb 6.4 oz  12/22 - 110 lb 14.3 oz   Estimated Energy Needs  Kcals: 1700-1900 Protein: 75-90 Fluid: >/= 1.7 L  NUTRITION DIAGNOSIS: Inadequate oral intake - addressing with TF    MALNUTRITION DIAGNOSIS: Severe malnutrition continues    INTERVENTION:  Suggest increasing to 5 cartons Nutren 2.0 given decreased po Oral intake of soft smooth textures as tolerated     MONITORING, EVALUATION, GOAL: wt trends, intake   NEXT VISIT: Monday January 19 during infusion

## 2024-02-07 ENCOUNTER — Encounter: Payer: Self-pay | Admitting: Oncology

## 2024-02-08 ENCOUNTER — Inpatient Hospital Stay: Admitting: Oncology

## 2024-02-08 ENCOUNTER — Inpatient Hospital Stay

## 2024-02-08 ENCOUNTER — Encounter (INDEPENDENT_AMBULATORY_CARE_PROVIDER_SITE_OTHER): Payer: Self-pay | Admitting: *Deleted

## 2024-02-08 ENCOUNTER — Inpatient Hospital Stay: Admitting: Dietician

## 2024-02-08 ENCOUNTER — Encounter: Payer: Self-pay | Admitting: Oncology

## 2024-02-08 DIAGNOSIS — R634 Abnormal weight loss: Secondary | ICD-10-CM

## 2024-02-08 DIAGNOSIS — C109 Malignant neoplasm of oropharynx, unspecified: Secondary | ICD-10-CM | POA: Diagnosis not present

## 2024-02-08 DIAGNOSIS — R1310 Dysphagia, unspecified: Secondary | ICD-10-CM | POA: Diagnosis not present

## 2024-02-08 DIAGNOSIS — D508 Other iron deficiency anemias: Secondary | ICD-10-CM

## 2024-02-08 DIAGNOSIS — G4489 Other headache syndrome: Secondary | ICD-10-CM | POA: Diagnosis not present

## 2024-02-08 DIAGNOSIS — Z931 Gastrostomy status: Secondary | ICD-10-CM

## 2024-02-08 DIAGNOSIS — Z5111 Encounter for antineoplastic chemotherapy: Secondary | ICD-10-CM | POA: Diagnosis not present

## 2024-02-08 LAB — CBC WITH DIFFERENTIAL/PLATELET
Abs Immature Granulocytes: 0.01 K/uL (ref 0.00–0.07)
Basophils Absolute: 0 K/uL (ref 0.0–0.1)
Basophils Relative: 1 %
Eosinophils Absolute: 0 K/uL (ref 0.0–0.5)
Eosinophils Relative: 0 %
HCT: 30.1 % — ABNORMAL LOW (ref 39.0–52.0)
Hemoglobin: 9.9 g/dL — ABNORMAL LOW (ref 13.0–17.0)
Immature Granulocytes: 1 %
Lymphocytes Relative: 5 %
Lymphs Abs: 0.1 K/uL — ABNORMAL LOW (ref 0.7–4.0)
MCH: 32.2 pg (ref 26.0–34.0)
MCHC: 32.9 g/dL (ref 30.0–36.0)
MCV: 98 fL (ref 80.0–100.0)
Monocytes Absolute: 0.6 K/uL (ref 0.1–1.0)
Monocytes Relative: 27 %
Neutro Abs: 1.4 K/uL — ABNORMAL LOW (ref 1.7–7.7)
Neutrophils Relative %: 66 %
Platelets: 212 K/uL (ref 150–400)
RBC: 3.07 MIL/uL — ABNORMAL LOW (ref 4.22–5.81)
RDW: 15.6 % — ABNORMAL HIGH (ref 11.5–15.5)
WBC: 2.1 K/uL — ABNORMAL LOW (ref 4.0–10.5)
nRBC: 0 % (ref 0.0–0.2)

## 2024-02-08 LAB — COMPREHENSIVE METABOLIC PANEL WITH GFR
ALT: 15 U/L (ref 0–44)
AST: 16 U/L (ref 15–41)
Albumin: 4.1 g/dL (ref 3.5–5.0)
Alkaline Phosphatase: 80 U/L (ref 38–126)
Anion gap: 12 (ref 5–15)
BUN: 20 mg/dL (ref 6–20)
CO2: 26 mmol/L (ref 22–32)
Calcium: 9 mg/dL (ref 8.9–10.3)
Chloride: 96 mmol/L — ABNORMAL LOW (ref 98–111)
Creatinine, Ser: 0.44 mg/dL — ABNORMAL LOW (ref 0.61–1.24)
GFR, Estimated: >60 mL/min
Glucose, Bld: 97 mg/dL (ref 70–99)
Potassium: 4.6 mmol/L (ref 3.5–5.1)
Sodium: 134 mmol/L — ABNORMAL LOW (ref 135–145)
Total Bilirubin: 0.3 mg/dL (ref 0.0–1.2)
Total Protein: 6.5 g/dL (ref 6.5–8.1)

## 2024-02-08 LAB — VITAMIN B12: Vitamin B-12: 396 pg/mL (ref 180–914)

## 2024-02-08 LAB — IRON AND TIBC
Iron: 35 ug/dL — ABNORMAL LOW (ref 45–182)
Saturation Ratios: 12 % — ABNORMAL LOW (ref 17.9–39.5)
TIBC: 293 ug/dL (ref 250–450)
UIBC: 257 ug/dL

## 2024-02-08 LAB — FERRITIN: Ferritin: 547 ng/mL — ABNORMAL HIGH (ref 24–336)

## 2024-02-08 LAB — MAGNESIUM: Magnesium: 2 mg/dL (ref 1.7–2.4)

## 2024-02-08 LAB — FOLATE: Folate: >20 ng/mL

## 2024-02-08 MED ORDER — SUCRALFATE 1 GM/10ML PO SUSP: 1.0000 g | Freq: Three times a day (TID) | ORAL | 0 refills | Status: AC

## 2024-02-08 MED ORDER — SODIUM CHLORIDE 0.9 % IV SOLN: Freq: Once | INTRAVENOUS | Status: AC

## 2024-02-08 NOTE — Progress Notes (Signed)
 Message received from A.Lenon RN / Dr. Davonna. NO treatment today. Give 1 liter of normal saline over an hour.

## 2024-02-08 NOTE — Patient Instructions (Signed)
 [facility] [place] - A DEPT OF [place]  Discharge Instructions: Thank you for choosing [facility] to provide your oncology and hematology care.  If you have a lab appointment with the Cancer Center - please note that after April 8th, 2024, all labs will be drawn in the cancer center.  You do not have to check in or register with the main entrance as you have in the past but will complete your check-in in the cancer center.  Wear comfortable clothing and clothing appropriate for easy access to any Portacath or PICC line.   We strive to give you quality time with your provider. You may need to reschedule your appointment if you arrive late (15 or more minutes).  Arriving late affects you and other patients whose appointments are after yours.  Also, if you miss three or more appointments without notifying the office, you may be dismissed from the clinic at the providers discretion.      For prescription refill requests, have your pharmacy contact our office and allow 72 hours for refills to be completed.    Today you received the following chemotherapy and/or immunotherapy agents 1 liter of normal saline.       To help prevent nausea and vomiting after your treatment, we encourage you to take your nausea medication as directed.  BELOW ARE SYMPTOMS THAT SHOULD BE REPORTED IMMEDIATELY: *FEVER GREATER THAN 100.4 F (38 C) OR HIGHER *CHILLS OR SWEATING *NAUSEA AND VOMITING THAT IS NOT CONTROLLED WITH YOUR NAUSEA MEDICATION *UNUSUAL SHORTNESS OF BREATH *UNUSUAL BRUISING OR BLEEDING *URINARY PROBLEMS (pain or burning when urinating, or frequent urination) *BOWEL PROBLEMS (unusual diarrhea, constipation, pain near the anus) TENDERNESS IN MOUTH AND THROAT WITH OR WITHOUT PRESENCE OF ULCERS (sore throat, sores in mouth, or a toothache) UNUSUAL RASH, SWELLING OR PAIN  UNUSUAL VAGINAL DISCHARGE OR ITCHING   Items with * indicate a potential emergency and  should be followed up as soon as possible or go to the Emergency Department if any problems should occur.  Please show the CHEMOTHERAPY ALERT CARD or IMMUNOTHERAPY ALERT CARD at check-in to the Emergency Department and triage nurse.  Should you have questions after your visit or need to cancel or reschedule your appointment, please contact [facility] [place] - A DEPT OF [facility] [phone]  and follow the prompts.  Office hours are 8:00 a.m. to 4:30 p.m. Monday - Friday. Please note that voicemails left after 4:00 p.m. may not be returned until the following business day.  We are closed weekends and major holidays. You have access to a nurse at all times for urgent questions. Please call the main number to the clinic [phone] and follow the prompts.  For any non-urgent questions, you may also contact your provider using MyChart. We now offer e-Visits for anyone 50 and older to request care online for non-urgent symptoms. For details visit [email or web address].   Also download the MyChart app! Go to the app store, search MyChart, open the app, select [place], and log in with your MyChart username and password.

## 2024-02-08 NOTE — Patient Instructions (Addendum)
 [facility] at [facility] Discharge Instructions   You were seen and examined today by Dr. Davonna.  She reviewed the results of your lab work which are normal/stable.   You will not require anymore chemotherapy since you have completed radiation.   We will see you back in 2 months. We will repeat lab work prior to this visit.   Return as scheduled.    Thank you for choosing [facility] at [facility] to provide your oncology and hematology care.  To afford each patient quality time with our provider, please arrive at least 15 minutes before your scheduled appointment time.   If you have a lab appointment with the Cancer Center please come in thru the Main Entrance and check in at the main information desk.  You need to re-schedule your appointment should you arrive 10 or more minutes late.  We strive to give you quality time with our providers, and arriving late affects you and other patients whose appointments are after yours.  Also, if you no show three or more times for appointments you may be dismissed from the clinic at the providers discretion.     Again, thank you for choosing [facility].  Our hope is that these requests will decrease the amount of time that you wait before being seen by our physicians.       _____________________________________________________________  Should you have questions after your visit to [facility], please contact our office at [phone] and follow the prompts.  Our office hours are 8:00 a.m. and 4:30 p.m. Monday - Friday.  Please note that voicemails left after 4:00 p.m. may not be returned until the following business day.  We are closed weekends and major holidays.  You do have access to a nurse 24-7, just call the main number to the clinic [phone] and do not press any options, hold on the line and a nurse will answer the phone.    For prescription refill requests, have  your pharmacy contact our office and allow 72 hours.    Due to Covid, you will need to wear a mask upon entering the hospital. If you do not have a mask, a mask will be given to you at the Main Entrance upon arrival. For doctor visits, patients may have 1 support person age 66 or older with them. For treatment visits, patients can not have anyone with them due to social distancing guidelines and our immunocompromised population.

## 2024-02-08 NOTE — Progress Notes (Signed)
 " Patient Care Team: [name], [name], NP as PCP - General Davonna Siad, MD as Medical Oncologist (Medical Oncology) Celestia Joesph SQUIBB, RN as Oncology Nurse Navigator (Medical Oncology)  Clinic Day:  02/01/2024  Referring physician: Benjamin [name], NP   CHIEF COMPLAINT:  CC: Recurrent oropharyngeal cancer    ASSESSMENT & PLAN:   Assessment & Plan: Jerome Washington  is a 54 y.o. 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 Likely T4N1M0 disease Discussed by ENT at [facility] and recommendation for oropharyngectomy with free flap and possible laryngectomy with post-CRT v/s CRT. Patient chose chemo RT. Started chemo RT with cisplatin  on 12/28/2023 [facility] NGS showed PD-L1 positive, CPS: 5   - C7D1 today. Tolerating well but tinnitus is worse. - Unsure if patient has completed RT or has 1 more week left.Will reschedule chemo tomorrow to coordinate with Radiation oncology. If patient completed his RT, will not give further chemotherapy.  Received more than 200mg /m2 of cumulative dosing of cisplatin .  - Labs reviewed today: CMP: Normal creatinine and LFTs.  CBC: Hemoglobin: 9.9, WBC:2.1, ANC:1400 - Physical exam stable today.  If patient has further RT planned, will give one more cycle tomorrow -Will give 1L NS today as patient feels weaker. - Will repeat CT scan 4-8 weeks after completion of chemo RT  Return to clinic in 8 weeks with a CT scan.  Anemia secondary to antineoplastic chemotherapy Mildly decreased hemoglobin due to chemotherapy, asymptomatic. Received IV iron previously Iron panel pending from today  - Monitored hemoglobin levels.  Protein-calorie malnutrition Weight stable but below baseline, receiving gastrostomy tube feeds. - Encouraged increased caloric intake to promote weight gain.  Insomnia  and headache Persistent insomnia and headaches, possibly cancer or treatment-related. Ibuprofen  suggested for headache relief. MRI brain with no evidence of disease  - Continue ibuprofen  400 mg for headache relief. - Improved now  Tobacco use Patient is a former smoker and quit smoking recently.   -Encouraged abstinence from smoking.  Radiation-induced neck swelling Mild neck swelling and redness due to radiation therapy, no acute distress. - Continued radiation therapy as scheduled.  Gastrostomy tube care with local irritation A little redness and occasional soreness at gastrostomy site. Improved today.  - Encouraged cleaning of the gastrostomy tube site two to three times daily.  Dysphagia Persistent dysphagia with burning sensation and cough on swallowing. Tolerates some foods but continues to have difficulty. Etiology likely multifactorial, related to neoplasm, prior radiation, and chemotherapy.  - Will prescribe carafate  to aid swallowing. - Assessed swallowing function during visit.   The patient understands the plans discussed today and is in agreement with them.  He knows to contact our office if he develops concerns prior to his next appointment.  The total time spent in the appointment was 23 minutes for the encounter with patient, including review of chart and various tests results, discussions about plan of care and coordination of care plan   Siad Davonna, MD  [facility] [place] - A DEPT OF [facility] [address] [place] [address] Dept: [phone] Dept Fax: [phone]   No orders of the defined types were placed in this encounter.    ONCOLOGY HISTORY:   Diagnosis: Recurrent oropharyngeal carcinoma   -10/22/2018: Floor of mouth, biopsy: Invasive squamous cell carcinoma, well-differentiated, keratinizing type.  The tumor has a thickness of  at least 4 mm and involves the specimen  base. 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  All surgical margins are negative for tumor or high-grade dysplasia.  No lymph node involvement.  Rest of the areas negative for carcinoma. -11/17/2023: Oropharynx excision: -Squamous cell carcinoma, moderately differentiated.  - Immunohistochemical stain for p16 is negative  -12/03/2023: PET scan: Large hypermetabolic mass involving the posterior oropharynx with metastatic disease involving the right posterolateral retropharyngeal space consistent with recurrent malignancy. No suspicious finding to suggest new distant metastasis within the abdomen and pelvis. -12/11/2023: IR guided port insertion -12/28/2023- Current: Weekly cisplatin  40 mg/m -12/28/2023- Current: RT to the oropharyngeal mass. - 12/11/ 2025: [facility] NGS: PD-L1: Positive, CPS: 5, TP 53: Positive  - NTRK 1/2/3, RET, ERBB2, CDKN2a: Negative  - MSI-stable, TMB: Low, 5 mut/Mb.    Current Treatment:  CRT with cisplatin   INTERVAL HISTORY:   Discussed the use of AI scribe software for clinical note transcription with the patient, who gave verbal consent to proceed.  History of Present Illness Jerome Washington is a 54 year old male with primary malignant neoplasm of the head and neck undergoing chemoradiation who presents for evaluation of persistent cough, dysphagia, neoplasm-related pain, and tinnitus.  He reports a persistent, severe cough that disrupts sleep and daily activities. The cough is associated with significant throat pain, particularly with swallowing, described as a burning sensation. Oral intake, including eating and drinking, exacerbates both the cough and throat pain. He requests more effective pain management for these symptoms.  He experiences ongoing dysphagia with odynophagia. He is able to tolerate some foods, but the process is uncomfortable due  to throat pain and swelling. He has ageusia but retains his sense of smell. Despite these symptoms, he reports preserved energy levels.  He describes his tinnitus as particularly severe today. He attributes his current symptoms to ongoing chemotherapy and radiation. He is uncertain about the exact status of his treatment schedule.    I have reviewed the past medical history, past surgical history, social history and family history with the patient and they are unchanged from previous note.  ALLERGIES:  is allergic to bee venom, morphine  and codeine , and penicillins.  MEDICATIONS:  Current Outpatient Medications  Medication Sig Dispense Refill   acetaminophen  (TYLENOL ) 500 MG tablet Crush and place 1 tablet (500 mg total) into feeding tube every 8 (eight) hours as needed as directed. 20 tablet 0   albuterol  (PROVENTIL ) (2.5 MG/3ML) 0.083% nebulizer solution Take 3 mLs (2.5 mg total) by nebulization every 2 (two) hours as needed for wheezing or shortness of breath. 90 mL 12   aluminum -magnesium  hydroxide 200-200 MG/5ML suspension Take 10 mLs by mouth every 6 (six) hours as needed for indigestion (mouth sores/pain). Mix 1:1 with viscous lidocaine  2% 480 mL 1   dexamethasone  (DECADRON ) 4 MG tablet Take 2 tablets (8 mg) by mouth daily x 3 days starting the day after cisplatin  chemotherapy. Take with food. 30 tablet 1   docusate (COLACE) 50 MG/5ML liquid Place 10 mLs (100 mg total) into feeding tube 2 (two) times daily as needed for mild constipation. 100 mL 0   ferrous sulfate 324 MG TBEC Take 324 mg by mouth.     hydrOXYzine  (VISTARIL ) 25 MG capsule Take 1 capsule (25 mg total) by mouth at bedtime as needed for anxiety. 30 capsule 0   lidocaine  (XYLOCAINE ) 2 % solution Use as directed 15 mLs in the mouth or throat  as needed for mouth pain. Mix 1:1 with Maalox 480 mL 1   lidocaine -prilocaine  (EMLA ) cream Apply 1 Application topically as needed. 30 g 1   lidocaine -prilocaine  (EMLA ) cream Apply to  affected area once 30 g 3   Multiple Vitamin (MULTIVITAMIN WITH MINERALS) TABS tablet Crush and place 1 tablet into feeding tube daily as directed. 30 tablet 0   Nutritional Supplements (FEEDING SUPPLEMENT, OSMOLITE 1.5 CAL,) LIQD Place 1,000 mLs into feeding tube continuous. 35cc/hr     nystatin (MYCOSTATIN) 100000 UNIT/ML suspension Take 5 mLs by mouth 4 (four) times daily.     ondansetron  (ZOFRAN ) 8 MG tablet Take 1 tablet (8 mg total) by mouth every 8 (eight) hours as needed for nausea or vomiting. Start on the third day after cisplatin . 30 tablet 1   polyethylene glycol powder (GLYCOLAX /MIRALAX ) 17 GM/SCOOP powder Take 17 g by mouth daily as needed for moderate constipation. Dissolve 1 capful (17g) in 4-8 ounces of liquid and take by mouth daily. 238 g 0   predniSONE  (DELTASONE ) 20 MG tablet TAKE 1 TABLET BY MOUTH ONCE DAILY WITH BREAKFAST FOR 5 DAYS     prochlorperazine  (COMPAZINE ) 10 MG tablet Take 1 tablet (10 mg total) by mouth every 6 (six) hours as needed for nausea or vomiting. 30 tablet 0   prochlorperazine  (COMPAZINE ) 10 MG tablet Take 1 tablet (10 mg total) by mouth every 6 (six) hours as needed (Nausea or vomiting). 30 tablet 1   propranolol (INDERAL) 10 MG tablet Take 10 mg by mouth.     Protein (FEEDING SUPPLEMENT, PROSOURCE TF20,) liquid Place 60 mLs into feeding tube 2 (two) times daily. 60 mL 5   traZODone (DESYREL) 50 MG tablet Take 50 mg by mouth daily.     Water  For Irrigation, Sterile (FREE WATER ) SOLN Place 200 mLs into feeding tube every 8 (eight) hours.     sucralfate  (CARAFATE ) 1 GM/10ML suspension Take 10 mLs (1 g total) by mouth 4 (four) times daily -  with meals and at bedtime. 420 mL 0   No current facility-administered medications for this visit.     VITALS:  There were no vitals taken for this visit.  Wt Readings from Last 3 Encounters:  02/08/24 114 lb 13.8 oz (52.1 kg)  02/01/24 113 lb (51.3 kg)  01/18/24 112 lb 6.4 oz (51 kg)    There is no height or  weight on file to calculate BMI.  Performance status (ECOG): 0 - Asymptomatic  PHYSICAL EXAM:   GENERAL:alert, no distress and comfortable OROPHARYNX: Mass significantly smaller-unable to palpate at this time. No lymphadenopathy palpated.  Tracheostomy tube in place. Lipoma in the cheek on left side NECK: Redness in the neck, diffuse, non tender LYMPH:  no palpable lymphadenopathy in the cervical, axillary or inguinal LUNGS: clear to auscultation and percussion with normal breathing effort HEART: regular rate & rhythm and no murmurs and no lower extremity edema ABDOMEN:abdomen soft, non-tender and normal bowel sounds, G-tube in place   LABORATORY DATA:  I have reviewed the data as listed     Component Value Date/Time   NA 134 (L) 02/08/2024 0827   K 4.6 02/08/2024 0827   CL 96 (L) 02/08/2024 0827   CO2 26 02/08/2024 0827   GLUCOSE 97 02/08/2024 0827   BUN 20 02/08/2024 0827   CREATININE 0.44 (L) 02/08/2024 0827   CALCIUM 9.0 02/08/2024 0827   PROT 6.5 02/08/2024 0827   ALBUMIN 4.1 02/08/2024 0827   AST 16 02/08/2024 0827   ALT  15 02/08/2024 0827   ALKPHOS 80 02/08/2024 0827   BILITOT 0.3 02/08/2024 0827   GFRNONAA >60 02/08/2024 0827   GFRAA >60 07/23/2015 2130     Lab Results  Component Value Date   WBC 2.1 (L) 02/08/2024   NEUTROABS 1.4 (L) 02/08/2024   HGB 9.9 (L) 02/08/2024   HCT 30.1 (L) 02/08/2024   MCV 98.0 02/08/2024   PLT 212 02/08/2024      Chemistry      Component Value Date/Time   NA 134 (L) 02/08/2024 0827   K 4.6 02/08/2024 0827   CL 96 (L) 02/08/2024 0827   CO2 26 02/08/2024 0827   BUN 20 02/08/2024 0827   CREATININE 0.44 (L) 02/08/2024 0827      Component Value Date/Time   CALCIUM 9.0 02/08/2024 0827   ALKPHOS 80 02/08/2024 0827   AST 16 02/08/2024 0827   ALT 15 02/08/2024 0827   BILITOT 0.3 02/08/2024 0827        Latest Reference Range & Units 12/09/23 14:16  Iron 45 - 182 ug/dL 19 (L)  UIBC ug/dL 713  TIBC 749 - 549 ug/dL 694   Saturation Ratios 17.9 - 39.5 % 6 (L)  Ferritin 24 - 336 ng/mL 147  Folate >5.9 ng/mL 19.5  Vitamin B12 180 - 914 pg/mL 643  (L): Data is abnormally low  RADIOGRAPHIC STUDIES: I have personally reviewed the radiological images as listed and agreed with the findings in the report.  "

## 2024-02-08 NOTE — Progress Notes (Signed)
 1 liter of normal saline given today per MD orders. Tolerated infusion without adverse affects. Vital signs stable. No complaints at this time. Discharged from clinic ambulatory in stable condition. Alert and oriented x 3. F/U with [facility] as scheduled.

## 2024-02-08 NOTE — Progress Notes (Signed)
 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 He is receiving concurrent chemoradiation with weekly cisplatin  (start 12/8). S/p PEG and trach 11/17/23   Met with patient in infusion. Treatment held today. He is receiving IVF. Patient reports sore throat and painful swallow. Patient having intermittent hoarseness.  He has not been eating orally this last week. He will take small sips of water  through out the day. Patient has increased tube feedings. Says he does 5 -5 1/2 cartons of Nutren 2.0 @50  ml/hr. Redness to neck appreciated. He is using cream given by radiation.    Medications: reviewed   Labs: Na 134, Cr 0.44, Hgb 9.9  Anthropometrics: Wt 114 lb 13.8 oz today - increased   1/12 - 113 lb  12/29 - 112 lb 6.4 oz  12/22 - 110 lb 14.3 oz   Estimated Energy Needs  Kcals: 1700-1900 Protein: 75-90 Fluid: >/= 1.7 L  NUTRITION DIAGNOSIS: Inadequate oral intake - addressing with TF   MALNUTRITION DIAGNOSIS: Severe malnutrition continues - however improving    INTERVENTION:  Continue 5 cartons Nutren 2.0 @ 36ml/hr Support and encouragement     MONITORING, EVALUATION, GOAL: wt trends, TF   NEXT VISIT: Monday January 26 via telephone

## 2024-02-08 NOTE — Addendum Note (Signed)
 Addended by: TAMELA LEACH D on: 02/08/2024 09:41 AM   Modules accepted: Orders

## 2024-02-09 ENCOUNTER — Other Ambulatory Visit: Payer: Self-pay

## 2024-02-10 ENCOUNTER — Inpatient Hospital Stay

## 2024-02-10 VITALS — BP 111/84 | HR 89 | Temp 99.0°F | Resp 17

## 2024-02-10 DIAGNOSIS — C109 Malignant neoplasm of oropharynx, unspecified: Secondary | ICD-10-CM

## 2024-02-10 DIAGNOSIS — Z5111 Encounter for antineoplastic chemotherapy: Secondary | ICD-10-CM | POA: Diagnosis not present

## 2024-02-10 MED ORDER — SODIUM CHLORIDE 0.9 % IV SOLN: Freq: Once | INTRAVENOUS | Status: AC

## 2024-02-10 MED ORDER — MAGNESIUM SULFATE 2 GM/50ML IV SOLN
2.0000 g | Freq: Once | INTRAVENOUS | Status: AC
  Administered 2024-02-10: 2 g via INTRAVENOUS
  Filled 2024-02-10: qty 50

## 2024-02-10 MED ORDER — POTASSIUM CHLORIDE IN NACL 20-0.9 MEQ/L-% IV SOLN
Freq: Once | INTRAVENOUS | Status: AC
  Filled 2024-02-10: qty 1000

## 2024-02-10 MED ORDER — SODIUM CHLORIDE 0.9 % IV SOLN
40.0000 mg/m2 | Freq: Once | INTRAVENOUS | Status: AC
  Administered 2024-02-10: 65 mg via INTRAVENOUS
  Filled 2024-02-10: qty 65

## 2024-02-10 MED ORDER — DEXAMETHASONE SOD PHOSPHATE PF 10 MG/ML IJ SOLN
10.0000 mg | Freq: Once | INTRAMUSCULAR | Status: AC
  Administered 2024-02-10: 10 mg via INTRAVENOUS

## 2024-02-10 MED ORDER — SODIUM CHLORIDE 0.9 % IV SOLN: INTRAVENOUS | Status: DC

## 2024-02-10 MED ORDER — PALONOSETRON HCL INJECTION 0.25 MG/5ML
0.2500 mg | Freq: Once | INTRAVENOUS | Status: AC
  Administered 2024-02-10: 0.25 mg via INTRAVENOUS
  Filled 2024-02-10: qty 5

## 2024-02-10 MED ORDER — SODIUM CHLORIDE 0.9 % IV SOLN
150.0000 mg | Freq: Once | INTRAVENOUS | Status: AC
  Administered 2024-02-10: 150 mg via INTRAVENOUS
  Filled 2024-02-10: qty 150

## 2024-02-10 NOTE — Progress Notes (Signed)
 Patient presents today for Cisplatin  infusion per providers order.  Vital signs within parameters for treatment.  ANC noted to be 1.4, per Davonna okay to proceed with treatment.  Treatment given today per MD orders.  Stable during infusion without adverse affects.  Vital signs stable.  No complaints at this time.  Discharge from clinic ambulatory in stable condition.  Alert and oriented X 3.  Follow up with [facility] as scheduled.

## 2024-02-10 NOTE — Patient Instructions (Signed)
 [facility] [place] - A DEPT OF [place]  Discharge Instructions: Thank you for choosing [facility] to provide your oncology and hematology care.  If you have a lab appointment with the Cancer Center - please note that after April 8th, 2024, all labs will be drawn in the cancer center.  You do not have to check in or register with the main entrance as you have in the past but will complete your check-in in the cancer center.  Wear comfortable clothing and clothing appropriate for easy access to any Portacath or PICC line.   We strive to give you quality time with your provider. You may need to reschedule your appointment if you arrive late (15 or more minutes).  Arriving late affects you and other patients whose appointments are after yours.  Also, if you miss three or more appointments without notifying the office, you may be dismissed from the clinic at the provider's discretion.      For prescription refill requests, have your pharmacy contact our office and allow 72 hours for refills to be completed.    Today you received the following chemotherapy and/or immunotherapy agents Cisplatin       To help prevent nausea and vomiting after your treatment, we encourage you to take your nausea medication as directed.  BELOW ARE SYMPTOMS THAT SHOULD BE REPORTED IMMEDIATELY: *FEVER GREATER THAN 100.4 F (38 C) OR HIGHER *CHILLS OR SWEATING *NAUSEA AND VOMITING THAT IS NOT CONTROLLED WITH YOUR NAUSEA MEDICATION *UNUSUAL SHORTNESS OF BREATH *UNUSUAL BRUISING OR BLEEDING *URINARY PROBLEMS (pain or burning when urinating, or frequent urination) *BOWEL PROBLEMS (unusual diarrhea, constipation, pain near the anus) TENDERNESS IN MOUTH AND THROAT WITH OR WITHOUT PRESENCE OF ULCERS (sore throat, sores in mouth, or a toothache) UNUSUAL RASH, SWELLING OR PAIN  UNUSUAL VAGINAL DISCHARGE OR ITCHING   Items with * indicate a potential emergency and should be followed up  as soon as possible or go to the Emergency Department if any problems should occur.  Please show the CHEMOTHERAPY ALERT CARD or IMMUNOTHERAPY ALERT CARD at check-in to the Emergency Department and triage nurse.  Should you have questions after your visit or need to cancel or reschedule your appointment, please contact [facility] [place] - A DEPT OF [facility] [phone]  and follow the prompts.  Office hours are 8:00 a.m. to 4:30 p.m. Monday - Friday. Please note that voicemails left after 4:00 p.m. may not be returned until the following business day.  We are closed weekends and major holidays. You have access to a nurse at all times for urgent questions. Please call the main number to the clinic [phone] and follow the prompts.  For any non-urgent questions, you may also contact your provider using MyChart. We now offer e-Visits for anyone 84 and older to request care online for non-urgent symptoms. For details visit [email or web address].   Also download the MyChart app! Go to the app store, search MyChart, open the app, select [place], and log in with your MyChart username and password.

## 2024-02-10 NOTE — Progress Notes (Signed)
 Ok to tx w/ANC=1.4 per Dr. Davonna.  Jerome Washington, PharmD, MBA

## 2024-02-12 ENCOUNTER — Inpatient Hospital Stay

## 2024-02-12 VITALS — BP 108/67 | HR 72 | Temp 98.5°F | Resp 18

## 2024-02-12 DIAGNOSIS — C109 Malignant neoplasm of oropharynx, unspecified: Secondary | ICD-10-CM

## 2024-02-12 DIAGNOSIS — Z5111 Encounter for antineoplastic chemotherapy: Secondary | ICD-10-CM | POA: Diagnosis not present

## 2024-02-12 MED ORDER — ACETAMINOPHEN 325 MG PO TABS
650.0000 mg | ORAL_TABLET | Freq: Once | ORAL | Status: AC
  Administered 2024-02-12: 650 mg via ORAL
  Filled 2024-02-12: qty 2

## 2024-02-12 MED ORDER — CETIRIZINE HCL 10 MG/ML IV SOLN
10.0000 mg | Freq: Once | INTRAVENOUS | Status: AC
  Administered 2024-02-12: 10 mg via INTRAVENOUS
  Filled 2024-02-12: qty 1

## 2024-02-12 MED ORDER — SODIUM CHLORIDE 0.9 % IV SOLN: INTRAVENOUS | Status: DC

## 2024-02-12 MED ORDER — SODIUM CHLORIDE 0.9 % IV SOLN
510.0000 mg | Freq: Once | INTRAVENOUS | Status: AC
  Administered 2024-02-12: 510 mg via INTRAVENOUS
  Filled 2024-02-12: qty 510

## 2024-02-12 NOTE — Patient Instructions (Signed)

## 2024-02-12 NOTE — Progress Notes (Signed)
Patient presents today for iron infusion. Patient is in satisfactory condition with no new complaints voiced.  Vital signs are stable.  We will proceed with infusion per provider orders.    Feraheme 510 mg given today per MD orders. Tolerated infusion without adverse affects. Vital signs stable. No complaints at this time. Discharged from clinic ambulatory in stable condition. Alert and oriented x 3. F/U with [facility] as scheduled.

## 2024-02-15 ENCOUNTER — Inpatient Hospital Stay: Admitting: Dietician

## 2024-02-15 ENCOUNTER — Telehealth: Payer: Self-pay | Admitting: Dietician

## 2024-02-15 NOTE — Telephone Encounter (Unsigned)
 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 He is receiving concurrent chemoradiation with weekly cisplatin  (start 12/8). S/p PEG and trach 11/17/23        Medications: ***  Labs: ***  Anthropometrics:   Height: *** Weight: *** UBW: *** BMI: ***   Estimated Energy Needs  Kcals: *** Protein: *** Fluid: ***  NUTRITION DIAGNOSIS: ***   MALNUTRITION DIAGNOSIS: ***   INTERVENTION: ***    MONITORING, EVALUATION, GOAL: wt trends, TF, po   NEXT VISIT: ***

## 2024-02-19 ENCOUNTER — Inpatient Hospital Stay

## 2024-02-19 VITALS — BP 109/67 | HR 80 | Temp 96.2°F | Resp 18 | Wt 113.9 lb

## 2024-02-19 DIAGNOSIS — C109 Malignant neoplasm of oropharynx, unspecified: Secondary | ICD-10-CM

## 2024-02-19 DIAGNOSIS — Z5111 Encounter for antineoplastic chemotherapy: Secondary | ICD-10-CM | POA: Diagnosis not present

## 2024-02-19 MED ORDER — SODIUM CHLORIDE 0.9 % IV SOLN
510.0000 mg | Freq: Once | INTRAVENOUS | Status: AC
  Administered 2024-02-19: 510 mg via INTRAVENOUS
  Filled 2024-02-19: qty 510

## 2024-02-19 MED ORDER — ACETAMINOPHEN 325 MG PO TABS: 650.0000 mg | ORAL_TABLET | Freq: Once | ORAL | Status: DC

## 2024-02-19 MED ORDER — SODIUM CHLORIDE 0.9 % IV SOLN: INTRAVENOUS | Status: DC

## 2024-02-19 MED ORDER — CETIRIZINE HCL 10 MG/ML IV SOLN
10.0000 mg | Freq: Once | INTRAVENOUS | Status: AC
  Administered 2024-02-19: 10 mg via INTRAVENOUS
  Filled 2024-02-19: qty 1

## 2024-02-19 NOTE — Patient Instructions (Signed)

## 2024-02-19 NOTE — Progress Notes (Signed)
 Patient presents today for Feraheme infusion per providers order.  Vital signs WNL.  Patient took Tylenol  at home prior to appointment.  Patient has no new complaints at this time.  Stable during infusion without adverse affects.  Vital signs stable.  No complaints at this time.  Discharge from clinic ambulatory in stable condition.  Alert and oriented X 3.  Follow up with [facility] as scheduled.

## 2024-04-07 ENCOUNTER — Other Ambulatory Visit (HOSPITAL_COMMUNITY)

## 2024-04-07 ENCOUNTER — Inpatient Hospital Stay: Attending: Oncology

## 2024-04-14 ENCOUNTER — Inpatient Hospital Stay: Admitting: Oncology
